# Patient Record
Sex: Female | Born: 1950 | Race: White | Hispanic: No | Marital: Married | Smoking: Current every day smoker
Health system: Southern US, Community
[De-identification: ages and names within clinical notes are randomized; demographics above are authoritative.]

## PROBLEM LIST (undated history)

## (undated) DIAGNOSIS — G43909 Migraine, unspecified, not intractable, without status migrainosus: Secondary | ICD-10-CM

## (undated) DIAGNOSIS — E785 Hyperlipidemia, unspecified: Secondary | ICD-10-CM

## (undated) DIAGNOSIS — I341 Nonrheumatic mitral (valve) prolapse: Secondary | ICD-10-CM

## (undated) DIAGNOSIS — E079 Disorder of thyroid, unspecified: Secondary | ICD-10-CM

## (undated) DIAGNOSIS — I10 Essential (primary) hypertension: Secondary | ICD-10-CM

## (undated) HISTORY — DX: Nonrheumatic mitral (valve) prolapse: I34.1

## (undated) HISTORY — DX: Migraine, unspecified, not intractable, without status migrainosus: G43.909

## (undated) HISTORY — DX: Hyperlipidemia, unspecified: E78.5

## (undated) HISTORY — DX: Disorder of thyroid, unspecified: E07.9

## (undated) HISTORY — PX: BUNIONECTOMY: SHX129

---

## 1997-08-17 ENCOUNTER — Emergency Department (HOSPITAL_COMMUNITY): Admission: EM | Admit: 1997-08-17 | Discharge: 1997-08-17 | Payer: Self-pay | Admitting: Emergency Medicine

## 1998-11-20 ENCOUNTER — Encounter: Payer: Self-pay | Admitting: Endocrinology

## 1998-11-20 ENCOUNTER — Ambulatory Visit (HOSPITAL_COMMUNITY): Admission: RE | Admit: 1998-11-20 | Discharge: 1998-11-20 | Payer: Self-pay | Admitting: Endocrinology

## 1998-11-24 ENCOUNTER — Encounter: Payer: Self-pay | Admitting: Endocrinology

## 1998-11-24 ENCOUNTER — Ambulatory Visit (HOSPITAL_COMMUNITY): Admission: RE | Admit: 1998-11-24 | Discharge: 1998-11-24 | Payer: Self-pay | Admitting: *Deleted

## 1998-11-25 ENCOUNTER — Ambulatory Visit (HOSPITAL_COMMUNITY): Admission: RE | Admit: 1998-11-25 | Discharge: 1998-11-25 | Payer: Self-pay | Admitting: Endocrinology

## 1998-11-25 ENCOUNTER — Encounter: Payer: Self-pay | Admitting: Endocrinology

## 1998-12-25 ENCOUNTER — Ambulatory Visit (HOSPITAL_COMMUNITY): Admission: RE | Admit: 1998-12-25 | Discharge: 1998-12-25 | Payer: Self-pay | Admitting: Cardiology

## 1999-01-20 ENCOUNTER — Other Ambulatory Visit: Admission: RE | Admit: 1999-01-20 | Discharge: 1999-01-20 | Payer: Self-pay | Admitting: *Deleted

## 2000-01-15 ENCOUNTER — Ambulatory Visit (HOSPITAL_BASED_OUTPATIENT_CLINIC_OR_DEPARTMENT_OTHER): Admission: RE | Admit: 2000-01-15 | Discharge: 2000-01-15 | Payer: Self-pay | Admitting: Orthopedic Surgery

## 2001-02-03 ENCOUNTER — Other Ambulatory Visit: Admission: RE | Admit: 2001-02-03 | Discharge: 2001-02-03 | Payer: Self-pay | Admitting: Family Medicine

## 2002-02-20 ENCOUNTER — Other Ambulatory Visit: Admission: RE | Admit: 2002-02-20 | Discharge: 2002-02-20 | Payer: Self-pay | Admitting: Family Medicine

## 2003-06-19 ENCOUNTER — Other Ambulatory Visit: Admission: RE | Admit: 2003-06-19 | Discharge: 2003-06-19 | Payer: Self-pay | Admitting: Family Medicine

## 2003-08-22 ENCOUNTER — Emergency Department (HOSPITAL_COMMUNITY): Admission: EM | Admit: 2003-08-22 | Discharge: 2003-08-23 | Payer: Self-pay | Admitting: Emergency Medicine

## 2004-08-19 ENCOUNTER — Ambulatory Visit: Payer: Self-pay | Admitting: Family Medicine

## 2004-08-19 ENCOUNTER — Other Ambulatory Visit: Admission: RE | Admit: 2004-08-19 | Discharge: 2004-08-19 | Payer: Self-pay | Admitting: Family Medicine

## 2004-09-02 ENCOUNTER — Encounter: Admission: RE | Admit: 2004-09-02 | Discharge: 2004-09-02 | Payer: Self-pay | Admitting: Family Medicine

## 2004-09-11 ENCOUNTER — Encounter: Admission: RE | Admit: 2004-09-11 | Discharge: 2004-09-11 | Payer: Self-pay | Admitting: Family Medicine

## 2005-02-19 ENCOUNTER — Ambulatory Visit: Payer: Self-pay | Admitting: Family Medicine

## 2006-04-06 ENCOUNTER — Ambulatory Visit: Payer: Self-pay | Admitting: Family Medicine

## 2006-04-06 LAB — CONVERTED CEMR LAB
AST: 22 units/L (ref 0–37)
Bilirubin, Direct: 0.1 mg/dL (ref 0.0–0.3)
Cholesterol: 387 mg/dL (ref 0–200)
Direct LDL: 95.2 mg/dL
Total Bilirubin: 0.7 mg/dL (ref 0.3–1.2)
Total Protein: 6.1 g/dL (ref 6.0–8.3)
VLDL: 140 mg/dL — ABNORMAL HIGH (ref 0–40)

## 2007-05-24 ENCOUNTER — Telehealth (INDEPENDENT_AMBULATORY_CARE_PROVIDER_SITE_OTHER): Payer: Self-pay | Admitting: *Deleted

## 2007-06-08 ENCOUNTER — Telehealth (INDEPENDENT_AMBULATORY_CARE_PROVIDER_SITE_OTHER): Payer: Self-pay | Admitting: *Deleted

## 2007-06-20 ENCOUNTER — Ambulatory Visit: Payer: Self-pay | Admitting: Internal Medicine

## 2007-06-20 DIAGNOSIS — F17200 Nicotine dependence, unspecified, uncomplicated: Secondary | ICD-10-CM | POA: Insufficient documentation

## 2007-06-20 DIAGNOSIS — E785 Hyperlipidemia, unspecified: Secondary | ICD-10-CM | POA: Insufficient documentation

## 2007-06-20 DIAGNOSIS — K219 Gastro-esophageal reflux disease without esophagitis: Secondary | ICD-10-CM

## 2007-06-20 DIAGNOSIS — F411 Generalized anxiety disorder: Secondary | ICD-10-CM

## 2007-06-20 DIAGNOSIS — E039 Hypothyroidism, unspecified: Secondary | ICD-10-CM | POA: Insufficient documentation

## 2007-06-22 ENCOUNTER — Telehealth: Payer: Self-pay | Admitting: Internal Medicine

## 2007-06-23 ENCOUNTER — Telehealth (INDEPENDENT_AMBULATORY_CARE_PROVIDER_SITE_OTHER): Payer: Self-pay | Admitting: *Deleted

## 2007-06-23 LAB — CONVERTED CEMR LAB
AST: 25 units/L (ref 0–37)
Albumin: 3.8 g/dL (ref 3.5–5.2)
Alkaline Phosphatase: 26 units/L — ABNORMAL LOW (ref 39–117)
BUN: 9 mg/dL (ref 6–23)
Bilirubin, Direct: 0.1 mg/dL (ref 0.0–0.3)
CO2: 30 meq/L (ref 19–32)
Calcium: 9.7 mg/dL (ref 8.4–10.5)
Chloride: 102 meq/L (ref 96–112)
Glucose, Bld: 93 mg/dL (ref 70–99)
HCT: 39.2 % (ref 36.0–46.0)
HDL: 32.7 mg/dL — ABNORMAL LOW (ref >39.0)
MCV: 89.8 fL (ref 78.0–100.0)
Platelets: 216 10*3/uL (ref 150–400)
RDW: 14.2 % (ref 11.5–14.6)
TSH: 2.62 microintl units/mL (ref 0.35–5.50)
Total Protein: 5.6 g/dL — ABNORMAL LOW (ref 6.0–8.3)
Triglycerides: 992 mg/dL (ref 0–149)
VLDL: 198 mg/dL — ABNORMAL HIGH (ref 0–40)

## 2007-06-27 ENCOUNTER — Ambulatory Visit: Payer: Self-pay | Admitting: Internal Medicine

## 2007-06-27 DIAGNOSIS — I10 Essential (primary) hypertension: Secondary | ICD-10-CM

## 2007-06-28 ENCOUNTER — Telehealth (INDEPENDENT_AMBULATORY_CARE_PROVIDER_SITE_OTHER): Payer: Self-pay | Admitting: *Deleted

## 2007-06-28 ENCOUNTER — Encounter: Payer: Self-pay | Admitting: Internal Medicine

## 2007-11-30 ENCOUNTER — Telehealth (INDEPENDENT_AMBULATORY_CARE_PROVIDER_SITE_OTHER): Payer: Self-pay | Admitting: *Deleted

## 2008-08-29 ENCOUNTER — Telehealth (INDEPENDENT_AMBULATORY_CARE_PROVIDER_SITE_OTHER): Payer: Self-pay | Admitting: *Deleted

## 2008-09-19 ENCOUNTER — Encounter (INDEPENDENT_AMBULATORY_CARE_PROVIDER_SITE_OTHER): Payer: Self-pay | Admitting: *Deleted

## 2008-10-28 ENCOUNTER — Telehealth (INDEPENDENT_AMBULATORY_CARE_PROVIDER_SITE_OTHER): Payer: Self-pay | Admitting: *Deleted

## 2008-11-12 ENCOUNTER — Encounter: Payer: Self-pay | Admitting: Family Medicine

## 2008-11-12 ENCOUNTER — Encounter: Payer: Self-pay | Admitting: Internal Medicine

## 2008-11-12 ENCOUNTER — Other Ambulatory Visit: Admission: RE | Admit: 2008-11-12 | Discharge: 2008-11-12 | Payer: Self-pay | Admitting: Family Medicine

## 2008-11-12 ENCOUNTER — Ambulatory Visit: Payer: Self-pay | Admitting: Family Medicine

## 2008-11-12 DIAGNOSIS — Z780 Asymptomatic menopausal state: Secondary | ICD-10-CM | POA: Insufficient documentation

## 2008-11-12 LAB — CONVERTED CEMR LAB: Nitrite: NEGATIVE

## 2008-11-18 ENCOUNTER — Telehealth: Payer: Self-pay | Admitting: Family Medicine

## 2008-11-26 ENCOUNTER — Encounter: Admission: RE | Admit: 2008-11-26 | Discharge: 2008-11-26 | Payer: Self-pay | Admitting: Family Medicine

## 2008-12-02 ENCOUNTER — Telehealth: Payer: Self-pay | Admitting: Family Medicine

## 2008-12-03 LAB — CONVERTED CEMR LAB
ALT: 26 units/L (ref 0–35)
AST: 21 units/L (ref 0–37)
Albumin: 4.2 g/dL (ref 3.5–5.2)
Alkaline Phosphatase: 49 units/L (ref 39–117)
Chloride: 103 meq/L (ref 96–112)
Cholesterol: 422 mg/dL — ABNORMAL HIGH (ref 0–200)
Creatinine, Ser: 0.7 mg/dL (ref 0.4–1.2)
Eosinophils Relative: 4 % (ref 0.0–5.0)
Folate: >20 ng/mL
Free T4: 1 ng/dL (ref 0.6–1.6)
GFR calc non Af Amer: 90.64 mL/min (ref >60)
Glucose, Bld: 92 mg/dL (ref 70–99)
HDL: 39.3 mg/dL (ref >39.00)
Hemoglobin: 12.8 g/dL (ref 12.0–15.0)
MCHC: 32.6 g/dL (ref 30.0–36.0)
MCV: 90.4 fL (ref 78.0–100.0)
Neutrophils Relative %: 36 % — ABNORMAL LOW (ref 43.0–77.0)
Platelets: 242 10*3/uL (ref 150.0–400.0)
Potassium: 4.2 meq/L (ref 3.5–5.1)
RDW: 13.5 % (ref 11.5–14.6)
VLDL: 117.6 mg/dL — ABNORMAL HIGH (ref 0.0–40.0)

## 2009-12-01 ENCOUNTER — Telehealth: Payer: Self-pay | Admitting: Family Medicine

## 2010-02-18 ENCOUNTER — Telehealth (INDEPENDENT_AMBULATORY_CARE_PROVIDER_SITE_OTHER): Payer: Self-pay | Admitting: *Deleted

## 2010-02-23 ENCOUNTER — Other Ambulatory Visit: Payer: Self-pay | Admitting: Family Medicine

## 2010-02-23 ENCOUNTER — Encounter (INDEPENDENT_AMBULATORY_CARE_PROVIDER_SITE_OTHER): Payer: Self-pay | Admitting: *Deleted

## 2010-02-23 ENCOUNTER — Encounter: Payer: Self-pay | Admitting: Family Medicine

## 2010-02-23 ENCOUNTER — Other Ambulatory Visit
Admission: RE | Admit: 2010-02-23 | Discharge: 2010-02-23 | Payer: Self-pay | Source: Home / Self Care | Admitting: Family Medicine

## 2010-02-23 ENCOUNTER — Ambulatory Visit
Admission: RE | Admit: 2010-02-23 | Discharge: 2010-02-23 | Payer: Self-pay | Source: Home / Self Care | Attending: Family Medicine | Admitting: Family Medicine

## 2010-02-23 DIAGNOSIS — M2550 Pain in unspecified joint: Secondary | ICD-10-CM | POA: Insufficient documentation

## 2010-02-23 DIAGNOSIS — D239 Other benign neoplasm of skin, unspecified: Secondary | ICD-10-CM | POA: Insufficient documentation

## 2010-02-23 LAB — CBC WITH DIFFERENTIAL/PLATELET
Basophils Absolute: 0 10*3/uL (ref 0.0–0.1)
Basophils Relative: 0.1 % (ref 0.0–3.0)
Eosinophils Absolute: 0.1 10*3/uL (ref 0.0–0.7)
Eosinophils Relative: 1.4 % (ref 0.0–5.0)
HCT: 39.5 % (ref 36.0–46.0)
Hemoglobin: 13.1 g/dL (ref 12.0–15.0)
Lymphocytes Relative: 25.5 % (ref 12.0–46.0)
Lymphs Abs: 1.7 10*3/uL (ref 0.7–4.0)
MCHC: 33.3 g/dL (ref 30.0–36.0)
MCV: 89.7 fl (ref 78.0–100.0)
Monocytes Absolute: 0.3 10*3/uL (ref 0.1–1.0)
Monocytes Relative: 4.2 % (ref 3.0–12.0)
Neutro Abs: 4.5 10*3/uL (ref 1.4–7.7)
Neutrophils Relative %: 68.8 % (ref 43.0–77.0)
Platelets: 211 10*3/uL (ref 150.0–400.0)
RBC: 4.41 Mil/uL (ref 3.87–5.11)
RDW: 15.5 % — ABNORMAL HIGH (ref 11.5–14.6)
WBC: 6.6 10*3/uL (ref 4.5–10.5)

## 2010-02-23 LAB — LIPID PANEL
Cholesterol: 446 mg/dL — ABNORMAL HIGH (ref 0–200)
HDL: 50.9 mg/dL (ref >39.00)
Total CHOL/HDL Ratio: 9
Triglycerides: 567 mg/dL — ABNORMAL HIGH (ref 0.0–149.0)
VLDL: 113.4 mg/dL — ABNORMAL HIGH (ref 0.0–40.0)

## 2010-02-23 LAB — BASIC METABOLIC PANEL
BUN: 11 mg/dL (ref 6–23)
CO2: 25 mEq/L (ref 19–32)
Calcium: 9.4 mg/dL (ref 8.4–10.5)
Chloride: 101 mEq/L (ref 96–112)
Creatinine, Ser: 0.7 mg/dL (ref 0.4–1.2)
GFR: 85.99 mL/min (ref >60.00)
Glucose, Bld: 105 mg/dL — ABNORMAL HIGH (ref 70–99)
Potassium: 4.1 mEq/L (ref 3.5–5.1)
Sodium: 136 mEq/L (ref 135–145)

## 2010-02-23 LAB — HEPATIC FUNCTION PANEL
ALT: 25 U/L (ref 0–35)
AST: 20 U/L (ref 0–37)
Albumin: 4.2 g/dL (ref 3.5–5.2)
Alkaline Phosphatase: 48 U/L (ref 39–117)
Bilirubin, Direct: 0 mg/dL (ref 0.0–0.3)
Total Bilirubin: 0.5 mg/dL (ref 0.3–1.2)
Total Protein: 6.9 g/dL (ref 6.0–8.3)

## 2010-02-23 LAB — TSH: TSH: 1.55 u[IU]/mL (ref 0.35–5.50)

## 2010-02-23 LAB — T4, FREE: Free T4: 0.96 ng/dL (ref 0.60–1.60)

## 2010-02-23 LAB — LDL CHOLESTEROL, DIRECT: Direct LDL: 152.5 mg/dL

## 2010-02-23 LAB — SEDIMENTATION RATE: Sed Rate: 27 mm/hr — ABNORMAL HIGH (ref 0–22)

## 2010-02-23 LAB — T3, FREE: T3, Free: 2.3 pg/mL (ref 2.3–4.2)

## 2010-02-25 LAB — CONVERTED CEMR LAB
Anti Nuclear Antibody(ANA): NEGATIVE
Rhuematoid fact SerPl-aCnc: <10 intl units/mL (ref <=14)

## 2010-02-26 ENCOUNTER — Encounter: Payer: Self-pay | Admitting: Family Medicine

## 2010-03-01 ENCOUNTER — Encounter: Payer: Self-pay | Admitting: Family Medicine

## 2010-03-05 ENCOUNTER — Encounter
Admission: RE | Admit: 2010-03-05 | Discharge: 2010-03-05 | Payer: Self-pay | Source: Home / Self Care | Attending: Family Medicine | Admitting: Family Medicine

## 2010-03-05 ENCOUNTER — Encounter: Payer: Self-pay | Admitting: Family Medicine

## 2010-03-10 NOTE — Progress Notes (Signed)
Summary: CPX needed  Phone Note Outgoing Call   Call placed by: Army Fossa CMA,  December 01, 2009 4:19 PM Summary of Call: Pt needs CPX before additional refills.   Follow-up for Phone Call        Patient refused appt stating that this is an extremely busy time for her. Patient will call for CPX before she runs out of meds. She is aware no refills until after CPX appt. Lucious Groves CMA  December 02, 2009 3:11 PM

## 2010-03-12 ENCOUNTER — Ambulatory Visit: Admit: 2010-03-12 | Payer: Self-pay | Admitting: Gastroenterology

## 2010-03-12 NOTE — Letter (Signed)
Summary: Pre Visit Letter Revised  [facility]  [address] [place], [place] [address]   Phone: [phone]  Fax: [phone]        02/23/2010 MRN: [id number] Samantha Hanson [address] [place], [place]  [address]                            Procedure Date:  03/26/2010 @ 8:00AM                 Direct colon-Dr. Arlyce Dice Welcome to the Gastroenterology Division at [facility].    You are scheduled to see a nurse for your pre-procedure visit on 03/12/2010 at 2:00 on the 3rd floor at [facility], [address]  We ask that you try to arrive at our office 15 minutes prior to your appointment time to allow for check-in.  Please take a minute to review the attached form.  If you answer "Yes" to one or more of the questions on the first page, we ask that you call the person listed at your earliest opportunity.  If you answer "No" to all of the questions, please complete the rest of the form and bring it to your appointment.    Your nurse visit will consist of discussing your medical and surgical history, your immediate family medical history, and your medications.   If you are unable to list all of your medications on the form, please bring the medication bottles to your appointment and we will list them.  We will need to be aware of both prescribed and over the counter drugs.  We will need to know exact dosage information as well.    Please be prepared to read and sign documents such as consent forms, a financial agreement, and acknowledgement forms.  If necessary, and with your consent, a friend or relative is welcome to sit-in on the nurse visit with you.  Please bring your insurance card so that we may make a copy of it.  If your insurance requires a referral to see a specialist, please bring your referral form from your primary care physician.  No co-pay is required for this nurse visit.     If you cannot keep your appointment, please call [phone] to cancel  or reschedule prior to your appointment date.  This allows [place] the opportunity to schedule an appointment for another patient in need of care.    Thank you for choosing [facility] for your medical needs.  We appreciate the opportunity to care for you.  Please visit [place] at our website  to learn more about our practice.  Sincerely, The Gastroenterology Division

## 2010-03-12 NOTE — Letter (Signed)
Summary: Results Follow up Letter  [facility]  [address] [place], [place] [address]   Phone: [phone]  Fax: [phone]    02/26/2010 MRN: [id number]      Samantha Hanson [address] [place], [place]  [address]     Dear Ms. Kempner,   The following are the results of your recent test(s):    Test         Result    Pap Smear:        Normal __X___  Not Normal _____ Comments:       We routinely do not discuss normal results over the telephone.  If you desire a copy of the results, or you have any questions about this information we can discuss them at your next office visit.   Sincerely,  Almeta Monas CMA

## 2010-03-12 NOTE — Assessment & Plan Note (Signed)
Summary: cpx---will be fasting///sph   Vital Signs:  Patient profile:   60 year old female Height:      66.5 inches Weight:      175 pounds BMI:     27.92 O2 Sat:      95 % on Room air Temp:     98.5 degrees F oral Pulse rate:   100 / minute Resp:     18 per minute BP sitting:   120 / 86  (left arm)  Vitals Entered By: Jeremy Johann CMA (February 23, 2010 1:11 PM)  O2 Flow:  Room air CC: CPX, fasting, pap   History of Present Illness: Pt here for cpe. pap and fasting labs. Pt still c/o joint pains.   Pt is also coughing a lot for about 1 month--- coughing up white / clear mucous.  Pt took otc herbal meds with no relief.  Pt took benadryl and mult cough meds with no relief.    Hyperlipidemia follow-up      This is a 60 year old woman who presents for Hyperlipidemia follow-up.  The patient denies muscle aches, GI upset, abdominal pain, flushing, itching, constipation, diarrhea, and fatigue.  The patient denies the following symptoms: chest pain/pressure, exercise intolerance, dypsnea, palpitations, syncope, and pedal edema.  Compliance with medications (by patient report) has been near 100%.  Dietary compliance has been good.  The patient reports no exercise.  Adjunctive measures currently used by the patient include fish oil supplements.    Hypertension follow-up      The patient also presents for Hypertension follow-up.  The patient denies lightheadedness, urinary frequency, headaches, edema, impotence, rash, and fatigue.  The patient denies the following associated symptoms: chest pain, chest pressure, exercise intolerance, dyspnea, palpitations, syncope, leg edema, and pedal edema.  Compliance with medications (by patient report) has been near 100%.  The patient reports that dietary compliance has been good.  The patient reports no exercise.  Adjunctive measures currently used by the patient include salt restriction.    Preventive Screening-Counseling &  Management  Alcohol-Tobacco     Alcohol drinks/day: 0     Smoking Status: current     Smoking Cessation Counseling: yes     Smoke Cessation Stage: contemplative     Packs/Day: 1     Year Started: 1968  Caffeine-Diet-Exercise     Caffeine use/day: 0     Does Patient Exercise: no  Hep-HIV-STD-Contraception     Dental Visit-last 6 months no     Dental Care Counseling: to seek dental care; no dental care within six months     SBE monthly: yes      Sexual History:  currently monogamous.    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 1 By Mouth Once Daily 2)  Effexor Xr 75 Mg Xr24h-Cap (Venlafaxine Hcl) .[name].. 1 By Mouth Once Daily 3)  Cyclobenzaprine Hcl 10 Mg  Tabs (Cyclobenzaprine Hcl) .[name].. 1 By Mouth Prn 4)  Prevacid 30 Mg  Cpdr (Lansoprazole) .[name].. 1 By Mouth Qd 5)  Proventil Hfa 108 (90 Base) Mcg/act  Aers (Albuterol Sulfate) .[name].. 1-2 Puffs Q 4 Hours As Needed Sob 6)  Metoprolol Tartrate 25 Mg  Tabs (Metoprolol Tartrate) .[name].. 1 Bid 7)  Gemfibrozil 600 Mg  Tabs (Gemfibrozil) .[name].. 1 Bid 8)  Imitrex 50 Mg Tabs (Sumatriptan Succinate) .... As Directed 9)  Vicodin 5-500 Mg Tabs (Hydrocodone-Acetaminophen) .[name].. 1 By Mouth Q6h As Needed Headache 10)  Diflucan 150 Mg Tabs (Fluconazole) .[name].. 1 By Mouth Once Daily- Repeat in One Week As Needed. 11)  Vitamin D (Ergocalciferol) 50000 Unit Caps (Ergocalciferol) .[name].. 1 By Mouth Once Weekly. 12)  Lipitor 20 Mg Tabs (Atorvastatin Calcium) .[name].. 1 By  Mouth At Bedtime. 13)  Vitamin D3 2000 Unit Caps (Cholecalciferol) .[name].. 1 By Mouth Daily. 14)  Fish Oil  Oil (Fish Oil) .[name].. 1 By Mouth Two Times A Day  Current Medications (verified): 1)  Synthroid 112 Mcg  Tabs (Levothyroxine Sodium) .[name].. 1 By Mouth Once Daily 2)  Effexor Xr 75 Mg Xr24h-Cap (Venlafaxine Hcl) .[name].. 1 By Mouth Once Daily 3)  Cyclobenzaprine Hcl 10 Mg  Tabs (Cyclobenzaprine Hcl) .[name].. 1 By Mouth Prn 4)  Prevacid 30 Mg  Cpdr (Lansoprazole) .[name].. 1 By Mouth Qd 5)  Proventil Hfa 108 (90 Base) Mcg/act  Aers (Albuterol Sulfate) .[name].. 1-2 Puffs Q 4 Hours As Needed Sob 6)  Metoprolol Tartrate 25 Mg  Tabs (Metoprolol Tartrate) .[name].. 1 Bid 7)  Imitrex 50 Mg Tabs (Sumatriptan Succinate) .... As Directed 8)  Vicodin 5-500 Mg Tabs (Hydrocodone-Acetaminophen) .[name].. 1 By Mouth Q6h As Needed Headache 9)  Lipitor 20 Mg Tabs (Atorvastatin Calcium) .[name].. 1 By Mouth At Bedtime. 10)  Vitamin D3 2000 Unit Caps (Cholecalciferol) .[name].. 1 By Mouth Daily. 11)  Fish Oil  Oil (Fish Oil) .[name].. 1 By Mouth Two Times A Day 12)  Glucosamine 2000 .... Take 1 Tab Once Daily 13)  Daily-Vitamin  Tabs (Multiple Vitamin) .... Take 1 Tab Once Daily 14)  Minocycline Hcl 100 Mg Tabs (Minocycline Hcl) .... Take 1 Tab On Breakout 15)  Calcium .... Take 1 Tab Once Daily 16)  Zithromax Z-Pak 250 Mg Tabs (Azithromycin) .... As Directed 17)  Minocycline Hcl 100 Mg Caps (Minocycline Hcl) .[name].. 1 By Mouth Two Times A Day  Allergies (verified): No Known Drug Allergies  Past History:  Past Medical History: Last updated: 06/20/2007 Hyperlipidemia Hypothyroidism mitral valve prolapse migraine  Past Surgical History: Last updated: 06/27/2007 bunion right foot ; cath : "fine", Dr Aleen Campi  Family History: Last updated: 06/20/2007 Father:CAD  Mother:thyroid CA  Siblings:neg   Social History: Last updated: 11/12/2008 Current Smoker Married Alcohol use-no Drug use-no Regular exercise-yes  Risk Factors: Alcohol  Use: 0 (02/23/2010) Caffeine Use: 0 (02/23/2010) Exercise: no (02/23/2010)  Risk Factors: Smoking Status: current (02/23/2010) Packs/Day: 1 (02/23/2010)  Family History: Reviewed history from 06/20/2007 and no changes required. Father:CAD  Mother:thyroid CA  Siblings:neg   Social History: Reviewed history from 11/12/2008 and no changes required. Current Smoker Married Alcohol use-no Drug use-no Regular exercise-yes Does Patient Exercise:  no Sexual History:  currently monogamous  Review of Systems      See HPI General:  Denies chills, fatigue, fever, loss of appetite, malaise, sleep disorder, sweats, weakness, and weight loss. Eyes:  Denies blurring, discharge, double vision, eye irritation, eye pain, halos, itching, light sensitivity, red eye, vision loss-1 eye, and vision loss-both eyes. ENT:  Denies decreased hearing, difficulty swallowing, ear discharge, earache, hoarseness, nasal congestion, nosebleeds, postnasal drainage, ringing in ears, sinus pressure,  and sore throat. CV:  Denies bluish discoloration of lips or nails, chest pain or discomfort, difficulty breathing at night, difficulty breathing while lying down, fainting, fatigue, leg cramps with exertion, lightheadness, near fainting, palpitations, shortness of breath with exertion, swelling of feet, swelling of hands, and weight gain. Resp:  Denies chest discomfort, chest pain with inspiration, cough, coughing up blood, excessive snoring, hypersomnolence, morning headaches, pleuritic, shortness of breath, sputum productive, and wheezing. GI:  Denies abdominal pain, bloody stools, change in bowel habits, constipation, dark tarry stools, diarrhea, excessive appetite, gas, hemorrhoids, indigestion, loss of appetite, nausea, vomiting, vomiting blood, and yellowish skin color. GU:  Denies abnormal vaginal bleeding, decreased libido, discharge, dysuria, genital sores, hematuria, incontinence, nocturia, urinary frequency, and  urinary hesitancy. MS:  Complains of joint pain; denies joint redness, joint swelling, loss of strength, low back pain, mid back pain, muscle aches, muscle , cramps, muscle weakness, stiffness, and thoracic pain. Derm:  Denies changes in color of skin, changes in nail beds, dryness, excessive perspiration, flushing, hair loss, insect bite(s), itching, lesion(s), poor wound healing, and rash. Neuro:  Denies brief paralysis, difficulty with concentration, disturbances in coordination, falling down, headaches, inability to speak, memory loss, numbness, poor balance, seizures, sensation of room spinning, tingling, tremors, visual disturbances, and weakness. Psych:  Denies alternate hallucination ( auditory/visual), anxiety, depression, easily angered, easily tearful, irritability, mental problems, panic attacks, sense of great danger, suicidal thoughts/plans, thoughts of violence, unusual visions or sounds, and thoughts /plans of harming others. Endo:  Denies cold intolerance, excessive hunger, excessive thirst, excessive urination, heat intolerance, polyuria, and weight change. Heme:  Denies abnormal bruising, bleeding, enlarge lymph nodes, fevers, pallor, and skin discoloration. Allergy:  Denies hives or rash, itching eyes, persistent infections, seasonal allergies, and sneezing.  Physical Exam  General:  Well-developed,well-nourished,in no acute distress; alert,appropriate and cooperative throughout examination Head:  Normocephalic and atraumatic without obvious abnormalities. No apparent alopecia or balding. Eyes:  pupils equal, pupils round, pupils reactive to light, and no injection.   Ears:  External ear exam shows no significant lesions or deformities.  Otoscopic examination reveals clear canals, tympanic membranes are intact bilaterally without bulging, retraction, inflammation or discharge. Hearing is grossly normal bilaterally. Nose:  External nasal examination shows no deformity or  inflammation. Nasal mucosa are pink and moist without lesions or exudates. Mouth:  Oral mucosa and oropharynx without lesions or exudates.  Teeth in good repair. Neck:  No deformities, masses, or tenderness noted. Chest Wall:  No deformities, masses, or tenderness noted. Lungs:  Normal respiratory effort, chest expands symmetrically. Lungs are clear to auscultation, no crackles or wheezes. Heart:  normal rate and no murmur.   Abdomen:  Bowel sounds positive,abdomen soft and non-tender without masses, organomegaly or hernias noted. Rectal:  No external abnormalities noted. Normal sphincter tone. No rectal masses or tenderness. Genitalia:  Pelvic Exam:        External: normal female genitalia without lesions or masses        Vagina: normal without lesions or masses        Cervix: normal without lesions or masses        Adnexa: normal bimanual exam without masses or fullness        Uterus: normal by palpation        Pap smear: performed Msk:  No deformity or scoliosis noted of thoracic or lumbar spine.   Pulses:  R and L carotid,radial,femoral,dorsalis pedis and posterior tibial pulses are full and equal bilaterally Extremities:  No clubbing, cyanosis,  edema, or deformity noted with normal full range of motion of all joints.   Neurologic:  No cranial nerve deficits noted. Station and gait are normal. Plantar reflexes are down-going bilaterally. DTRs are symmetrical throughout. Sensory, motor and coordinative functions appear intact. Skin:  Intact without suspicious lesions or rashes Cervical Nodes:  No lymphadenopathy noted Axillary Nodes:  No palpable lymphadenopathy Psych:  Cognition and judgment appear intact. Alert and cooperative with normal attention span and concentration. No apparent delusions, illusions, hallucinations   Impression & Recommendations:  Problem # 1:  PREVENTIVE HEALTH CARE (ICD-V70.0)  Orders: Venipuncture (71062) TLB-Lipid Panel (80061-LIPID) TLB-BMP (Basic  Metabolic Panel-BMET) (80048-METABOL) TLB-CBC Platelet - w/Differential (85025-CBCD) TLB-Hepatic/Liver Function Pnl (80076-HEPATIC) TLB-TSH (Thyroid Stimulating Hormone) (84443-TSH) TLB-Sedimentation Rate (ESR) (85652-ESR) T-Rheumatoid Factor (69485-46270) T-Antinuclear Antib (ANA) [phone]) TLB-T4 (Thyrox), Free [phone]) TLB-T3, Free (Triiodothyronine) (84481-T3FREE) Radiology Referral (Radiology) T- * Misc. Laboratory test [phone]) Specimen Handling (01751) Gastroenterology Referral (GI) EKG w/ Interpretation (93000)  Problem # 2:  HYPERTRIGLYCERIDEMIA, SEVERE (ICD-272.4)  The following medications were removed from the medication list:    Gemfibrozil 600 Mg Tabs (Gemfibrozil) .[name]... 1 bid Her updated medication list for this problem includes:    Lipitor 20 Mg Tabs (Atorvastatin calcium) .[name]... 1 by mouth at bedtime.  Orders: Venipuncture (02585) TLB-Lipid Panel (80061-LIPID) TLB-BMP (Basic Metabolic Panel-BMET) (80048-METABOL) TLB-CBC Platelet - w/Differential (85025-CBCD) TLB-Hepatic/Liver Function Pnl (80076-HEPATIC) TLB-TSH (Thyroid Stimulating Hormone) (84443-TSH) TLB-Sedimentation Rate (ESR) (85652-ESR) T-Rheumatoid Factor (27782-42353) T-Antinuclear Antib (ANA) [phone]) TLB-T4 (Thyrox), Free [phone]) TLB-T3, Free (Triiodothyronine) (84481-T3FREE) Specimen Handling (32671) EKG w/ Interpretation (93000)  Labs Reviewed: SGOT: 21 (11/12/2008)   SGPT: 26 (11/12/2008)  Lipid Goals: Chol Goal: 200 (06/27/2007)   HDL Goal: 40 (06/27/2007)   LDL Goal: 100 (06/27/2007)   TG Goal: 150 (06/27/2007)  Prior 10 Yr Risk Heart Disease: Not enough information (06/27/2007)   HDL:39.30 (11/12/2008), 32.7 (06/20/2007)  LDL:DEL (06/20/2007), DEL (04/06/2006)  Chol:422 (11/12/2008), 569 (06/20/2007)  Trig:588.0 (11/12/2008), 992 (06/20/2007)  Problem # 3:  HYPERTENSION, ESSENTIAL NOS (ICD-401.9)  Her updated medication list for this problem includes:    Metoprolol  Tartrate 25 Mg Tabs (Metoprolol tartrate) .[name]... 1 bid  Orders: Venipuncture (24580) TLB-Lipid Panel (80061-LIPID) TLB-BMP (Basic Metabolic Panel-BMET) (80048-METABOL) TLB-CBC Platelet - w/Differential (85025-CBCD) TLB-Hepatic/Liver Function Pnl (80076-HEPATIC) TLB-TSH (Thyroid Stimulating Hormone) (84443-TSH) TLB-Sedimentation Rate (ESR) (85652-ESR) T-Rheumatoid Factor (99833-82505) T-Antinuclear Antib (ANA) [phone]) TLB-T4 (Thyrox), Free [phone]) TLB-T3, Free (Triiodothyronine) (84481-T3FREE) Specimen Handling (53299) EKG w/ Interpretation (93000)  BP today: 120/86 Prior BP: 142/90 (11/12/2008)  Prior 10 Yr Risk Heart Disease: Not enough information (06/27/2007)  Labs Reviewed: K+: 4.2 (11/12/2008) Creat: : 0.7 (11/12/2008)   Chol: 422 (11/12/2008)   HDL: 39.30 (11/12/2008)   LDL: DEL (06/20/2007)   TG: 588.0 (11/12/2008)  Problem # 4:  PAIN IN JOINT, MULTIPLE SITES (ICD-719.49)  Orders: Venipuncture (24268) TLB-Lipid Panel (80061-LIPID) TLB-BMP (Basic Metabolic Panel-BMET) (80048-METABOL) TLB-CBC Platelet - w/Differential (85025-CBCD) TLB-Hepatic/Liver Function Pnl (80076-HEPATIC) TLB-TSH (Thyroid Stimulating Hormone) (84443-TSH) TLB-Sedimentation Rate (ESR) (85652-ESR) T-Rheumatoid Factor (34196-22297) T-Antinuclear Antib (ANA) [phone]) TLB-T4 (Thyrox), Free [phone]) TLB-T3, Free (Triiodothyronine) (84481-T3FREE) Specimen Handling (56314) EKG w/ Interpretation (93000)  Problem # 5:  NEVI, MULTIPLE (ICD-216.9)  Orders: Dermatology Referral (Derma) EKG w/ Interpretation (93000)  Problem # 6:  POSTMENOPAUSAL STATUS (ICD-V49.81)  Orders: Venipuncture (97026) TLB-Lipid Panel (80061-LIPID) TLB-BMP (Basic Metabolic Panel-BMET) (80048-METABOL) TLB-CBC Platelet - w/Differential (85025-CBCD) TLB-Hepatic/Liver Function Pnl (80076-HEPATIC) TLB-TSH (Thyroid Stimulating Hormone) (84443-TSH) TLB-Sedimentation Rate (ESR) (85652-ESR) T-Rheumatoid Factor  (37858-85027) T-Antinuclear Antib (ANA) [phone]) TLB-T4 (Thyrox), Free [phone]) TLB-T3, Free (  Triiodothyronine) (84481-T3FREE) Radiology Referral (Radiology) Specimen Handling (04540) EKG w/ Interpretation (93000)  Problem # 7:  HYPOTHYROIDISM (ICD-244.9)  Her updated medication list for this problem includes:    Synthroid 112 Mcg Tabs (Levothyroxine sodium) .[name]... 1 by mouth once daily  Orders: Venipuncture (98119) TLB-Lipid Panel (80061-LIPID) TLB-BMP (Basic Metabolic Panel-BMET) (80048-METABOL) TLB-CBC Platelet - w/Differential (85025-CBCD) TLB-Hepatic/Liver Function Pnl (80076-HEPATIC) TLB-TSH (Thyroid Stimulating Hormone) (84443-TSH) TLB-Sedimentation Rate (ESR) (85652-ESR) T-Rheumatoid Factor (14782-95621) T-Antinuclear Antib (ANA) [phone]) TLB-T4 (Thyrox), Free [phone]) TLB-T3, Free (Triiodothyronine) (84481-T3FREE) Specimen Handling (24401) EKG w/ Interpretation (93000)  Labs Reviewed: TSH: 0.92 (11/12/2008)    Chol: 422 (11/12/2008)   HDL: 39.30 (11/12/2008)   LDL: DEL (06/20/2007)   TG: 588.0 (11/12/2008)  Problem # 8:  CIGARETTE SMOKER (ICD-305.1)  Encouraged smoking cessation and discussed different methods for smoking cessation.   Complete Medication List: 1)  Synthroid 112 Mcg Tabs (Levothyroxine sodium) .[name].. 1 by mouth once daily 2)  Effexor Xr 75 Mg Xr24h-cap (Venlafaxine hcl) .[name].. 1 by mouth once daily 3)  Cyclobenzaprine Hcl 10 Mg Tabs (Cyclobenzaprine hcl) .[name].. 1 by mouth prn 4)  Prevacid 30 Mg Cpdr (Lansoprazole) .[name].. 1 by mouth qd 5)  Proventil Hfa 108 (90 Base) Mcg/act Aers (Albuterol sulfate) .[name].. 1-2 puffs q 4 hours as needed sob 6)  Metoprolol Tartrate 25 Mg Tabs (Metoprolol tartrate) .[name].. 1 bid 7)  Imitrex 50 Mg Tabs (Sumatriptan succinate) .... As directed 8)  Vicodin 5-500 Mg Tabs (Hydrocodone-acetaminophen) .[name].. 1 by mouth q6h as needed headache 9)  Lipitor 20 Mg Tabs (Atorvastatin calcium) .[name].. 1 by mouth at bedtime. 10)   Vitamin D3 2000 Unit Caps (Cholecalciferol) .[name].. 1 by mouth daily. 11)  Fish Oil Oil (Fish oil) .[name].. 1 by mouth two times a day 12)  Glucosamine 2000  .... Take 1 tab once daily 13)  Daily-vitamin Tabs (Multiple vitamin) .... Take 1 tab once daily 14)  Minocycline Hcl 100 Mg Tabs (Minocycline hcl) .... Take 1 tab on breakout 15)  Calcium  .... Take 1 tab once daily 16)  Zithromax Z-pak 250 Mg Tabs (Azithromycin) .... As directed 17)  Minocycline Hcl 100 Mg Caps (Minocycline hcl) .[name].. 1 by mouth two times a day Prescriptions: MINOCYCLINE HCL 100 MG CAPS (MINOCYCLINE HCL) 1 by mouth two times a day  #60 x 2   Entered and Authorized by:   Loreen Freud DO   Signed by:   Floydene Flock on 02/23/2010   Method used:   Electronically to        [facility] [address] [id number]* (retail)       [address]       [place], [place]  [address]       Ph: [phone] or [phone]       Fax: [phone]   RxID:   [id number] VICODIN 5-500 MG TABS (HYDROCODONE-ACETAMINOPHEN) 1 by mouth q6h as needed headache  #30 x 0   Entered and Authorized by:   Loreen Freud DO   Signed by:   Loreen Freud DO on 02/23/2010   Method used:   Print then Give to Patient   RxID:   [id number] ZITHROMAX Z-PAK 250 MG TABS (AZITHROMYCIN) as directed  #1 x 0   Entered and Authorized by:   Loreen Freud DO   Signed by:   Loreen Freud DO on 02/23/2010   Method used:   Electronically to        [facility]  [facility] [id number] (retail)       [address]       [place], [place]  [address]       Ph: [phone] or [phone]       Fax: [phone]   RxID:   [id number] SYNTHROID 112 MCG  TABS (LEVOTHYROXINE SODIUM) 1 by mouth once daily Brand medically necessary #90 x 3   Entered and Authorized by:   Loreen Freud DO   Signed by:   Loreen Freud DO on 02/23/2010   Method used:   Printed then faxed to ...       [facility]  [id number]* (retail)       [address]       [place], [place]  [address]       Ph: [phone] or [phone]       Fax: [phone]   RxID:   [id number]    Orders Added: 1)  Venipuncture [26948] 2)  TLB-Lipid Panel [80061-LIPID] 3)  TLB-BMP (Basic Metabolic Panel-BMET) [80048-METABOL] 4)  TLB-CBC Platelet - w/Differential [85025-CBCD] 5)  TLB-Hepatic/Liver Function Pnl [80076-HEPATIC] 6)  TLB-TSH (Thyroid Stimulating Hormone) [84443-TSH] 7)  TLB-Sedimentation Rate (ESR) [85652-ESR] 8)  T-Rheumatoid Factor [54627-03500] 9)  T-Antinuclear Antib (ANA) [93818-29937] 10)  TLB-T4 (Thyrox), Free [16967-EL3Y] 11)  TLB-T3, Free (Triiodothyronine) [10175-Z0CHEN] 12)  Radiology Referral [Radiology] 13)  Radiology Referral [Radiology] 14)  T- * Misc. Laboratory test [phone] 15)  Dermatology Referral [Derma] 16)  Specimen Handling [99000] 17)  Gastroenterology Referral [GI] 18)  Est. Patient 40-64 years [99396] 54)  EKG w/ Interpretation [93000]     Flu Vaccine Next Due:  Refused

## 2010-03-12 NOTE — Progress Notes (Signed)
Summary: Synthroid, Venlafaxine refills  Phone Note Refill Request Message from:  Patient on February 18, 2010 4:45 PM  Refills Requested: Medication #1:  SYNTHROID 112 MCG  TABS 1 by mouth once daily  Medication #2:  EFFEXOR XR 75 MG XR24H-CAP 1 by mouth once daily [facility] 90 day prescriptiion ---will see Dr Laury Axon for CPX on 1/16 at 1:00  Next Appointment Scheduled: Mon   1/16    [place] Initial call taken by: Jerolyn Shin,  February 18, 2010 4:47 PM    Prescriptions: EFFEXOR XR 75 MG XR24H-CAP (VENLAFAXINE HCL) 1 by mouth once daily  #90 Capsule x 0   Entered by:   Almeta Monas CMA (AAMA)   Authorized by:   Loreen Freud DO   Signed by:   Almeta Monas CMA (AAMA) on 02/19/2010   Method used:   Faxed to ...       [facility] (mail-order)             , [place]         Ph: [phone]       Fax: [phone]   RxID:   [id number] SYNTHROID 112 MCG  TABS (LEVOTHYROXINE SODIUM) 1 by mouth once daily  #90 x 0   Entered by:   Almeta Monas CMA (AAMA)   Authorized by:   Loreen Freud DO   Signed by:   Almeta Monas CMA (AAMA) on 02/19/2010   Method used:   Faxed to ...       [facility] (mail-order)             , [place]         Ph: [phone]       Fax: [phone]   RxID:   [id number]

## 2010-03-26 ENCOUNTER — Other Ambulatory Visit: Payer: Self-pay | Admitting: Gastroenterology

## 2010-05-18 ENCOUNTER — Other Ambulatory Visit: Payer: Self-pay | Admitting: Family Medicine

## 2010-05-18 DIAGNOSIS — F329 Major depressive disorder, single episode, unspecified: Secondary | ICD-10-CM

## 2010-05-18 NOTE — Telephone Encounter (Signed)
Patient needs 90 day prescription for Venlafaxine HCL ER cap mailed to her (I verified address)---  She has [employer] and they have changed from [facility] to [facility]---patient needs to send this prescription in with her new patient packet

## 2010-05-19 ENCOUNTER — Telehealth: Payer: Self-pay | Admitting: Family Medicine

## 2010-05-19 MED ORDER — VENLAFAXINE HCL ER 75 MG PO CP24: 75.0000 mg | ORAL_CAPSULE | Freq: Every day | ORAL | Status: DC

## 2010-05-19 NOTE — Telephone Encounter (Signed)
Rx sent in the mail today to the patients home address as requested     KP

## 2010-05-19 NOTE — Telephone Encounter (Signed)
Refill for year

## 2010-05-19 NOTE — Telephone Encounter (Signed)
Patient needs two more 90 day prescriptions mailed to her since she changed from [facility] to [facility]    1)  Synthroid  112   And    2) Pravacid 30     Please mail to her

## 2010-05-19 NOTE — Telephone Encounter (Signed)
Informed patient about this prescription when she called two more in to be mailed

## 2010-05-19 NOTE — Telephone Encounter (Signed)
CPX with pap done 02/23/10 please advise      KP

## 2010-05-20 MED ORDER — SYNTHROID 112 MCG PO TABS: 112.0000 ug | ORAL_TABLET | Freq: Every day | ORAL | Status: DC

## 2010-05-20 MED ORDER — LANSOPRAZOLE 30 MG PO CPDR: 30.0000 mg | DELAYED_RELEASE_CAPSULE | Freq: Every day | ORAL | Status: DC

## 2010-05-20 NOTE — Telephone Encounter (Signed)
Rx's printed, will mail out to patient tomorrow after Dr.Lowne signs      KP

## 2010-08-27 ENCOUNTER — Ambulatory Visit (INDEPENDENT_AMBULATORY_CARE_PROVIDER_SITE_OTHER): Payer: BC Managed Care – PPO | Admitting: *Deleted

## 2010-08-27 DIAGNOSIS — Z0000 Encounter for general adult medical examination without abnormal findings: Secondary | ICD-10-CM

## 2011-01-08 ENCOUNTER — Other Ambulatory Visit: Payer: Self-pay | Admitting: Family Medicine

## 2011-01-08 MED ORDER — SYNTHROID 112 MCG PO TABS: 112.0000 ug | ORAL_TABLET | Freq: Every day | ORAL | Status: DC

## 2011-01-08 NOTE — Telephone Encounter (Signed)
Rx faxed.    KP 

## 2011-04-02 ENCOUNTER — Other Ambulatory Visit: Payer: Self-pay | Admitting: Family Medicine

## 2011-04-02 MED ORDER — SYNTHROID 112 MCG PO TABS: 112.0000 ug | ORAL_TABLET | Freq: Every day | ORAL | Status: DC

## 2011-04-02 NOTE — Telephone Encounter (Signed)
Faxed.   KP 

## 2011-05-12 ENCOUNTER — Telehealth: Payer: Self-pay | Admitting: Family Medicine

## 2011-05-12 NOTE — Telephone Encounter (Signed)
Patient would like to come in a week prior to her CPE scheduled for 6.4.13. I have put her on lab Schedule for 5.28 at 8am, could you please put in lab orders Thanks

## 2011-05-13 ENCOUNTER — Other Ambulatory Visit: Payer: Self-pay | Admitting: Family Medicine

## 2011-05-13 DIAGNOSIS — Z0000 Encounter for general adult medical examination without abnormal findings: Secondary | ICD-10-CM

## 2011-05-13 DIAGNOSIS — E039 Hypothyroidism, unspecified: Secondary | ICD-10-CM

## 2011-05-13 NOTE — Telephone Encounter (Signed)
Please advise      KP 

## 2011-05-13 NOTE — Telephone Encounter (Signed)
Orders in the system

## 2011-05-13 NOTE — Telephone Encounter (Signed)
Lab order in!

## 2011-06-07 ENCOUNTER — Telehealth: Payer: Self-pay | Admitting: Family Medicine

## 2011-06-07 DIAGNOSIS — F329 Major depressive disorder, single episode, unspecified: Secondary | ICD-10-CM

## 2011-06-07 MED ORDER — LANSOPRAZOLE 30 MG PO CPDR: 30.0000 mg | DELAYED_RELEASE_CAPSULE | Freq: Every day | ORAL | Status: DC

## 2011-06-07 MED ORDER — VENLAFAXINE HCL ER 75 MG PO CP24: 75.0000 mg | ORAL_CAPSULE | Freq: Every day | ORAL | Status: DC

## 2011-06-07 MED ORDER — SYNTHROID 112 MCG PO TABS: 112.0000 ug | ORAL_TABLET | Freq: Every day | ORAL | Status: DC

## 2011-06-07 NOTE — Telephone Encounter (Signed)
CPE and Lab Apt pending.       KP

## 2011-06-07 NOTE — Telephone Encounter (Signed)
Refill: Synthroid . Once by mouth a day. Lansoprazole cap 30mg . Take once day by mouth. Venlafaxine er cap 75mg / Take once by mouth a day.

## 2011-07-06 ENCOUNTER — Other Ambulatory Visit (INDEPENDENT_AMBULATORY_CARE_PROVIDER_SITE_OTHER): Payer: BC Managed Care – PPO

## 2011-07-06 ENCOUNTER — Other Ambulatory Visit: Payer: BC Managed Care – PPO

## 2011-07-06 DIAGNOSIS — E039 Hypothyroidism, unspecified: Secondary | ICD-10-CM

## 2011-07-06 DIAGNOSIS — Z0000 Encounter for general adult medical examination without abnormal findings: Secondary | ICD-10-CM

## 2011-07-06 LAB — CBC WITH DIFFERENTIAL/PLATELET
Basophils Relative: 1 % (ref 0.0–3.0)
Eosinophils Relative: 1.7 % (ref 0.0–5.0)
HCT: 40 % (ref 36.0–46.0)
Lymphs Abs: 2.1 10*3/uL (ref 0.7–4.0)
Monocytes Relative: 7.2 % (ref 3.0–12.0)
Neutro Abs: 3.6 10*3/uL (ref 1.4–7.7)
Neutrophils Relative %: 57.3 % (ref 43.0–77.0)
Platelets: 204 10*3/uL (ref 150.0–400.0)
RBC: 4.35 Mil/uL (ref 3.87–5.11)
RDW: 14.6 % (ref 11.5–14.6)

## 2011-07-06 LAB — HEPATIC FUNCTION PANEL
ALT: 22 U/L (ref 0–35)
Alkaline Phosphatase: 38 U/L — ABNORMAL LOW (ref 39–117)
Total Protein: 6.8 g/dL (ref 6.0–8.3)

## 2011-07-06 LAB — URINALYSIS: Urobilinogen, UA: 0.2 (ref 0.0–1.0)

## 2011-07-06 LAB — LIPID PANEL
HDL: 45.8 mg/dL (ref >39.00)
VLDL: 84.2 mg/dL — ABNORMAL HIGH (ref 0.0–40.0)

## 2011-07-06 LAB — BASIC METABOLIC PANEL: Glucose, Bld: 104 mg/dL — ABNORMAL HIGH (ref 70–99)

## 2011-07-06 NOTE — Progress Notes (Signed)
Labs only

## 2011-07-13 ENCOUNTER — Ambulatory Visit (INDEPENDENT_AMBULATORY_CARE_PROVIDER_SITE_OTHER): Payer: BC Managed Care – PPO | Admitting: Family Medicine

## 2011-07-13 ENCOUNTER — Encounter: Payer: Self-pay | Admitting: Family Medicine

## 2011-07-13 ENCOUNTER — Other Ambulatory Visit (HOSPITAL_COMMUNITY)
Admission: RE | Admit: 2011-07-13 | Discharge: 2011-07-13 | Disposition: A | Discharge status: Home / Self Care | Payer: BC Managed Care – PPO | Source: Ambulatory Visit | Attending: Family Medicine | Admitting: Family Medicine

## 2011-07-13 VITALS — BP 142/84 | HR 94 | Temp 98.4°F | Ht 66.0 in | Wt 177.6 lb

## 2011-07-13 DIAGNOSIS — Z01419 Encounter for gynecological examination (general) (routine) without abnormal findings: Secondary | ICD-10-CM | POA: Insufficient documentation

## 2011-07-13 DIAGNOSIS — E039 Hypothyroidism, unspecified: Secondary | ICD-10-CM

## 2011-07-13 DIAGNOSIS — I10 Essential (primary) hypertension: Secondary | ICD-10-CM

## 2011-07-13 DIAGNOSIS — F329 Major depressive disorder, single episode, unspecified: Secondary | ICD-10-CM

## 2011-07-13 DIAGNOSIS — Z0000 Encounter for general adult medical examination without abnormal findings: Secondary | ICD-10-CM

## 2011-07-13 DIAGNOSIS — M1990 Unspecified osteoarthritis, unspecified site: Secondary | ICD-10-CM

## 2011-07-13 DIAGNOSIS — L259 Unspecified contact dermatitis, unspecified cause: Secondary | ICD-10-CM

## 2011-07-13 DIAGNOSIS — E785 Hyperlipidemia, unspecified: Secondary | ICD-10-CM

## 2011-07-13 DIAGNOSIS — Z1239 Encounter for other screening for malignant neoplasm of breast: Secondary | ICD-10-CM

## 2011-07-13 DIAGNOSIS — Z124 Encounter for screening for malignant neoplasm of cervix: Secondary | ICD-10-CM

## 2011-07-13 MED ORDER — SYNTHROID 112 MCG PO TABS: 112.0000 ug | ORAL_TABLET | Freq: Every day | ORAL | Status: DC

## 2011-07-13 MED ORDER — MOMETASONE FUROATE 0.1 % EX CREA: TOPICAL_CREAM | Freq: Every day | CUTANEOUS | Status: DC

## 2011-07-13 MED ORDER — VENLAFAXINE HCL ER 75 MG PO CP24: 75.0000 mg | ORAL_CAPSULE | Freq: Every day | ORAL | Status: DC

## 2011-07-13 MED ORDER — METOPROLOL TARTRATE 25 MG PO TABS: 25.0000 mg | ORAL_TABLET | Freq: Two times a day (BID) | ORAL | Status: DC

## 2011-07-13 MED ORDER — FENOFIBRATE 145 MG PO TABS: 145.0000 mg | ORAL_TABLET | Freq: Every day | ORAL | Status: DC

## 2011-07-13 MED ORDER — TRAMADOL HCL 50 MG PO TABS: 50.0000 mg | ORAL_TABLET | Freq: Three times a day (TID) | ORAL | Status: AC | PRN

## 2011-07-13 MED ORDER — ATORVASTATIN CALCIUM 40 MG PO TABS: 40.0000 mg | ORAL_TABLET | Freq: Every day | ORAL | Status: DC

## 2011-07-13 NOTE — Assessment & Plan Note (Signed)
Check labs 

## 2011-07-13 NOTE — Patient Instructions (Signed)
Preventive Care for Adults, Female A healthy lifestyle and preventive care can promote health and wellness. Preventive health guidelines for women include the following key practices.  A routine yearly physical is a good way to check with your caregiver about your health and preventive screening. It is a chance to share any concerns and updates on your health, and to receive a thorough exam.   Visit your dentist for a routine exam and preventive care every 6 months. Brush your teeth twice a day and floss once a day. Good oral hygiene prevents tooth decay and gum disease.   The frequency of eye exams is based on your age, health, family medical history, use of contact lenses, and other factors. Follow your caregiver's recommendations for frequency of eye exams.   Eat a healthy diet. Foods like vegetables, fruits, whole grains, low-fat dairy products, and lean protein foods contain the nutrients you need without too many calories. Decrease your intake of foods high in solid fats, added sugars, and salt. Eat the right amount of calories for you.Get information about a proper diet from your caregiver, if necessary.   Regular physical exercise is one of the most important things you can do for your health. Most adults should get at least 150 minutes of moderate-intensity exercise (any activity that increases your heart rate and causes you to sweat) each week. In addition, most adults need muscle-strengthening exercises on 2 or more days a week.   Maintain a healthy weight. The body mass index (BMI) is a screening tool to identify possible weight problems. It provides an estimate of body fat based on height and weight. Your caregiver can help determine your BMI, and can help you achieve or maintain a healthy weight.For adults 20 years and older:   A BMI below 18.5 is considered underweight.   A BMI of 18.5 to 24.9 is normal.   A BMI of 25 to 29.9 is considered overweight.   A BMI of 30 and above is  considered obese.   Maintain normal blood lipids and cholesterol levels by exercising and minimizing your intake of saturated fat. Eat a balanced diet with plenty of fruit and vegetables. Blood tests for lipids and cholesterol should begin at age 20 and be repeated every 5 years. If your lipid or cholesterol levels are high, you are over 50, or you are at high risk for heart disease, you may need your cholesterol levels checked more frequently.Ongoing high lipid and cholesterol levels should be treated with medicines if diet and exercise are not effective.   If you smoke, find out from your caregiver how to quit. If you do not use tobacco, do not start.   If you are pregnant, do not drink alcohol. If you are breastfeeding, be very cautious about drinking alcohol. If you are not pregnant and choose to drink alcohol, do not exceed 1 drink per day. One drink is considered to be 12 ounces (355 mL) of beer, 5 ounces (148 mL) of wine, or 1.5 ounces (44 mL) of liquor.   Avoid use of street drugs. Do not share needles with anyone. Ask for help if you need support or instructions about stopping the use of drugs.   High blood pressure causes heart disease and increases the risk of stroke. Your blood pressure should be checked at least every 1 to 2 years. Ongoing high blood pressure should be treated with medicines if weight loss and exercise are not effective.   If you are 55 to 63   years old, ask your caregiver if you should take aspirin to prevent strokes.   Diabetes screening involves taking a blood sample to check your fasting blood sugar level. This should be done once every 3 years, after age 45, if you are within normal weight and without risk factors for diabetes. Testing should be considered at a younger age or be carried out more frequently if you are overweight and have at least 1 risk factor for diabetes.   Breast cancer screening is essential preventive care for women. You should practice "breast  self-awareness." This means understanding the normal appearance and feel of your breasts and may include breast self-examination. Any changes detected, no matter how small, should be reported to a caregiver. Women in their 20s and 30s should have a clinical breast exam (CBE) by a caregiver as part of a regular health exam every 1 to 3 years. After age 40, women should have a CBE every year. Starting at age 40, women should consider having a mammography (breast X-ray test) every year. Women who have a family history of breast cancer should talk to their caregiver about genetic screening. Women at a high risk of breast cancer should talk to their caregivers about having magnetic resonance imaging (MRI) and a mammography every year.   The Pap test is a screening test for cervical cancer. A Pap test can show cell changes on the cervix that might become cervical cancer if left untreated. A Pap test is a procedure in which cells are obtained and examined from the lower end of the uterus (cervix).   Women should have a Pap test starting at age 21.   Between ages 21 and 29, Pap tests should be repeated every 2 years.   Beginning at age 30, you should have a Pap test every 3 years as long as the past 3 Pap tests have been normal.   Some women have medical problems that increase the chance of getting cervical cancer. Talk to your caregiver about these problems. It is especially important to talk to your caregiver if a new problem develops soon after your last Pap test. In these cases, your caregiver may recommend more frequent screening and Pap tests.   The above recommendations are the same for women who have or have not gotten the vaccine for human papillomavirus (HPV).   If you had a hysterectomy for a problem that was not cancer or a condition that could lead to cancer, then you no longer need Pap tests. Even if you no longer need a Pap test, a regular exam is a good idea to make sure no other problems are  starting.   If you are between ages 65 and 70, and you have had normal Pap tests going back 10 years, you no longer need Pap tests. Even if you no longer need a Pap test, a regular exam is a good idea to make sure no other problems are starting.   If you have had past treatment for cervical cancer or a condition that could lead to cancer, you need Pap tests and screening for cancer for at least 20 years after your treatment.   If Pap tests have been discontinued, risk factors (such as a new sexual partner) need to be reassessed to determine if screening should be resumed.   The HPV test is an additional test that may be used for cervical cancer screening. The HPV test looks for the virus that can cause the cell changes on the cervix.   The cells collected during the Pap test can be tested for HPV. The HPV test could be used to screen women aged 30 years and older, and should be used in women of any age who have unclear Pap test results. After the age of 30, women should have HPV testing at the same frequency as a Pap test.   Colorectal cancer can be detected and often prevented. Most routine colorectal cancer screening begins at the age of 50 and continues through age 75. However, your caregiver may recommend screening at an earlier age if you have risk factors for colon cancer. On a yearly basis, your caregiver may provide home test kits to check for hidden blood in the stool. Use of a small camera at the end of a tube, to directly examine the colon (sigmoidoscopy or colonoscopy), can detect the earliest forms of colorectal cancer. Talk to your caregiver about this at age 50, when routine screening begins. Direct examination of the colon should be repeated every 5 to 10 years through age 75, unless early forms of pre-cancerous polyps or small growths are found.   Hepatitis C blood testing is recommended for all people born from 1945 through 1965 and any individual with known risks for hepatitis C.    Practice safe sex. Use condoms and avoid high-risk sexual practices to reduce the spread of sexually transmitted infections (STIs). STIs include gonorrhea, chlamydia, syphilis, trichomonas, herpes, HPV, and human immunodeficiency virus (HIV). Herpes, HIV, and HPV are viral illnesses that have no cure. They can result in disability, cancer, and death. Sexually active women aged 25 and younger should be checked for chlamydia. Older women with new or multiple partners should also be tested for chlamydia. Testing for other STIs is recommended if you are sexually active and at increased risk.   Osteoporosis is a disease in which the bones lose minerals and strength with aging. This can result in serious bone fractures. The risk of osteoporosis can be identified using a bone density scan. Women ages 65 and over and women at risk for fractures or osteoporosis should discuss screening with their caregivers. Ask your caregiver whether you should take a calcium supplement or vitamin D to reduce the rate of osteoporosis.   Menopause can be associated with physical symptoms and risks. Hormone replacement therapy is available to decrease symptoms and risks. You should talk to your caregiver about whether hormone replacement therapy is right for you.   Use sunscreen with sun protection factor (SPF) of 30 or more. Apply sunscreen liberally and repeatedly throughout the day. You should seek shade when your shadow is shorter than you. Protect yourself by wearing long sleeves, pants, a wide-brimmed hat, and sunglasses year round, whenever you are outdoors.   Once a month, do a whole body skin exam, using a mirror to look at the skin on your back. Notify your caregiver of new moles, moles that have irregular borders, moles that are larger than a pencil eraser, or moles that have changed in shape or color.   Stay current with required immunizations.   Influenza. You need a dose every fall (or winter). The composition of  the flu vaccine changes each year, so being vaccinated once is not enough.   Pneumococcal polysaccharide. You need 1 to 2 doses if you smoke cigarettes or if you have certain chronic medical conditions. You need 1 dose at age 65 (or older) if you have never been vaccinated.   Tetanus, diphtheria, pertussis (Tdap, Td). Get 1 dose of   Tdap vaccine if you are younger than age 65, are over 65 and have contact with an infant, are a healthcare worker, are pregnant, or simply want to be protected from whooping cough. After that, you need a Td booster dose every 10 years. Consult your caregiver if you have not had at least 3 tetanus and diphtheria-containing shots sometime in your life or have a deep or dirty wound.   HPV. You need this vaccine if you are a woman age 26 or younger. The vaccine is given in 3 doses over 6 months.   Measles, mumps, rubella (MMR). You need at least 1 dose of MMR if you were born in 1957 or later. You may also need a second dose.   Meningococcal. If you are age 19 to 21 and a first-year college student living in a residence hall, or have one of several medical conditions, you need to get vaccinated against meningococcal disease. You may also need additional booster doses.   Zoster (shingles). If you are age 60 or older, you should get this vaccine.   Varicella (chickenpox). If you have never had chickenpox or you were vaccinated but received only 1 dose, talk to your caregiver to find out if you need this vaccine.   Hepatitis A. You need this vaccine if you have a specific risk factor for hepatitis A virus infection or you simply wish to be protected from this disease. The vaccine is usually given as 2 doses, 6 to 18 months apart.   Hepatitis B. You need this vaccine if you have a specific risk factor for hepatitis B virus infection or you simply wish to be protected from this disease. The vaccine is given in 3 doses, usually over 6 months.  Preventive Services /  Frequency Ages 19 to 39  Blood pressure check.** / Every 1 to 2 years.   Lipid and cholesterol check.** / Every 5 years beginning at age 20.   Clinical breast exam.** / Every 3 years for women in their 20s and 30s.   Pap test.** / Every 2 years from ages 21 through 29. Every 3 years starting at age 30 through age 65 or 70 with a history of 3 consecutive normal Pap tests.   HPV screening.** / Every 3 years from ages 30 through ages 65 to 70 with a history of 3 consecutive normal Pap tests.   Hepatitis C blood test.** / For any individual with known risks for hepatitis C.   Skin self-exam. / Monthly.   Influenza immunization.** / Every year.   Pneumococcal polysaccharide immunization.** / 1 to 2 doses if you smoke cigarettes or if you have certain chronic medical conditions.   Tetanus, diphtheria, pertussis (Tdap, Td) immunization. / A one-time dose of Tdap vaccine. After that, you need a Td booster dose every 10 years.   HPV immunization. / 3 doses over 6 months, if you are 26 and younger.   Measles, mumps, rubella (MMR) immunization. / You need at least 1 dose of MMR if you were born in 1957 or later. You may also need a second dose.   Meningococcal immunization. / 1 dose if you are age 19 to 21 and a first-year college student living in a residence hall, or have one of several medical conditions, you need to get vaccinated against meningococcal disease. You may also need additional booster doses.   Varicella immunization.** / Consult your caregiver.   Hepatitis A immunization.** / Consult your caregiver. 2 doses, 6 to 18 months   apart.   Hepatitis B immunization.** / Consult your caregiver. 3 doses usually over 6 months.  Ages 40 to 64  Blood pressure check.** / Every 1 to 2 years.   Lipid and cholesterol check.** / Every 5 years beginning at age 20.   Clinical breast exam.** / Every year after age 40.   Mammogram.** / Every year beginning at age 40 and continuing for as  long as you are in good health. Consult with your caregiver.   Pap test.** / Every 3 years starting at age 30 through age 65 or 70 with a history of 3 consecutive normal Pap tests.   HPV screening.** / Every 3 years from ages 30 through ages 65 to 70 with a history of 3 consecutive normal Pap tests.   Fecal occult blood test (FOBT) of stool. / Every year beginning at age 50 and continuing until age 75. You may not need to do this test if you get a colonoscopy every 10 years.   Flexible sigmoidoscopy or colonoscopy.** / Every 5 years for a flexible sigmoidoscopy or every 10 years for a colonoscopy beginning at age 50 and continuing until age 75.   Hepatitis C blood test.** / For all people born from 1945 through 1965 and any individual with known risks for hepatitis C.   Skin self-exam. / Monthly.   Influenza immunization.** / Every year.   Pneumococcal polysaccharide immunization.** / 1 to 2 doses if you smoke cigarettes or if you have certain chronic medical conditions.   Tetanus, diphtheria, pertussis (Tdap, Td) immunization.** / A one-time dose of Tdap vaccine. After that, you need a Td booster dose every 10 years.   Measles, mumps, rubella (MMR) immunization. / You need at least 1 dose of MMR if you were born in 1957 or later. You may also need a second dose.   Varicella immunization.** / Consult your caregiver.   Meningococcal immunization.** / Consult your caregiver.   Hepatitis A immunization.** / Consult your caregiver. 2 doses, 6 to 18 months apart.   Hepatitis B immunization.** / Consult your caregiver. 3 doses, usually over 6 months.  Ages 65 and over  Blood pressure check.** / Every 1 to 2 years.   Lipid and cholesterol check.** / Every 5 years beginning at age 20.   Clinical breast exam.** / Every year after age 40.   Mammogram.** / Every year beginning at age 40 and continuing for as long as you are in good health. Consult with your caregiver.   Pap test.** /  Every 3 years starting at age 30 through age 65 or 70 with a 3 consecutive normal Pap tests. Testing can be stopped between 65 and 70 with 3 consecutive normal Pap tests and no abnormal Pap or HPV tests in the past 10 years.   HPV screening.** / Every 3 years from ages 30 through ages 65 or 70 with a history of 3 consecutive normal Pap tests. Testing can be stopped between 65 and 70 with 3 consecutive normal Pap tests and no abnormal Pap or HPV tests in the past 10 years.   Fecal occult blood test (FOBT) of stool. / Every year beginning at age 50 and continuing until age 75. You may not need to do this test if you get a colonoscopy every 10 years.   Flexible sigmoidoscopy or colonoscopy.** / Every 5 years for a flexible sigmoidoscopy or every 10 years for a colonoscopy beginning at age 50 and continuing until age 75.   Hepatitis   C blood test.** / For all people born from 1945 through 1965 and any individual with known risks for hepatitis C.   Osteoporosis screening.** / A one-time screening for women ages 65 and over and women at risk for fractures or osteoporosis.   Skin self-exam. / Monthly.   Influenza immunization.** / Every year.   Pneumococcal polysaccharide immunization.** / 1 dose at age 65 (or older) if you have never been vaccinated.   Tetanus, diphtheria, pertussis (Tdap, Td) immunization. / A one-time dose of Tdap vaccine if you are over 65 and have contact with an infant, are a healthcare worker, or simply want to be protected from whooping cough. After that, you need a Td booster dose every 10 years.   Varicella immunization.** / Consult your caregiver.   Meningococcal immunization.** / Consult your caregiver.   Hepatitis A immunization.** / Consult your caregiver. 2 doses, 6 to 18 months apart.   Hepatitis B immunization.** / Check with your caregiver. 3 doses, usually over 6 months.  ** Family history and personal history of risk and conditions may change your caregiver's  recommendations. Document Released: 03/23/2001 Document Revised: 01/14/2011 Document Reviewed: 06/22/2010 [employer] Patient Information 2012 [employer]. 

## 2011-07-13 NOTE — Progress Notes (Signed)
Subjective:     Samantha Hanson is a 61 y.o. female and is here for a comprehensive physical exam. The patient reports no problems.  History   Social History  . Marital Status: Married    Spouse Name: N/A    Number of Children: N/A  . Years of Education: N/A   Occupational History  . Not on file.   Social History Main Topics  . Smoking status: Never Smoker   . Smokeless tobacco: Never Used  . Alcohol Use: No  . Drug Use: No  . Sexually Active: Not on file   Other Topics Concern  . Not on file   Social History Narrative  . No narrative on file   Health Maintenance  Topic Date Due  . Colonoscopy  08/07/1998  . Influenza Vaccine  11/09/2011  . Mammogram  03/05/2012  . Pap Smear  02/23/2013  . Tetanus/tdap  08/20/2014  . Zostavax  Completed    The following portions of the patient's history were reviewed and updated as appropriate: allergies, current medications, past family history, past medical history, past social history, past surgical history and problem list.  Review of Systems. Review of Systems  Constitutional: Negative for activity change, appetite change and fatigue.  HENT: Negative for hearing loss, congestion, tinnitus and ear discharge.  dentist q54m Eyes: Negative for visual disturbance (see optho q1y -- vision corrected to 20/20 with glasses).  Respiratory: Negative for cough, chest tightness and shortness of breath.   Cardiovascular: Negative for chest pain, palpitations and leg swelling.  Gastrointestinal: Negative for abdominal pain, diarrhea, constipation and abdominal distention.  Genitourinary: Negative for urgency, frequency, decreased urine volume and difficulty urinating.  Musculoskeletal: Negative for back pain, arthralgias and gait problem.  Skin: Negative for color change, pallor and rash.  Neurological: Negative for dizziness, light-headedness, numbness and headaches.  Hematological: Negative for adenopathy. Does not bruise/bleed easily.    Psychiatric/Behavioral: Negative for suicidal ideas, confusion, sleep disturbance, self-injury, dysphoric mood, decreased concentration and agitation.       Objective:    BP 142/84  Pulse 94  Temp(Src) 98.4 F (36.9 C) (Oral)  Ht 5\' 6"  (1.676 m)  Wt 177 lb 9.6 oz (80.559 kg)  BMI 28.67 kg/m2  SpO2 98% General appearance: alert, cooperative, appears stated age and no distress Head: Normocephalic, without obvious abnormality, atraumatic Eyes: conjunctivae/corneas clear. PERRL, EOM's intact. Fundi benign. Ears: normal TM's and external ear canals both ears Nose: Nares normal. Septum midline. Mucosa normal. No drainage or sinus tenderness. Throat: lips, mucosa, and tongue normal; teeth and gums normal Neck: no adenopathy, no carotid bruit, no JVD, supple, symmetrical, trachea midline and thyroid not enlarged, symmetric, no tenderness/mass/nodules Back: symmetric, no curvature. ROM normal. No CVA tenderness. Lungs: clear to auscultation bilaterally Breasts: normal appearance, no masses or tenderness, No nipple retraction or dimpling Heart: regular rate and rhythm, S1, S2 normal, no murmur, click, rub or gallop Abdomen: soft, non-tender; bowel sounds normal; no masses,  no organomegaly Pelvic: cervix normal in appearance, external genitalia normal, no adnexal masses or tenderness, no cervical motion tenderness, rectovaginal septum normal, uterus normal size, shape, and consistency and vagina normal without discharge Extremities: extremities normal, atraumatic, no cyanosis or edema Pulses: 2+ and symmetric Skin: Skin color, texture, turgor normal. No rashes or lesions Lymph nodes: Cervical, supraclavicular, and axillary nodes normal. Neurologic: Alert and oriented X 3, normal strength and tone. Normal symmetric reflexes. Normal coordination and gait psych--no depression / anxiety    Assessment:    Healthy  female exam.      Plan:     See After Visit Summary for Counseling  Recommendations

## 2011-07-13 NOTE — Assessment & Plan Note (Signed)
Stable Cont meds 

## 2011-08-03 ENCOUNTER — Other Ambulatory Visit: Payer: Self-pay

## 2011-08-03 MED ORDER — HYDROCODONE-ACETAMINOPHEN 5-500 MG PO TABS: 1.0000 | ORAL_TABLET | Freq: Four times a day (QID) | ORAL | Status: DC | PRN

## 2011-08-03 NOTE — Telephone Encounter (Signed)
Last seen 07/13/11 and filled 3/12. Say she gets 30 once a year. Please advise    KP

## 2011-08-03 NOTE — Telephone Encounter (Signed)
Refill x1 

## 2011-08-03 NOTE — Telephone Encounter (Signed)
Faxed.   KP 

## 2011-08-10 ENCOUNTER — Encounter: Payer: Self-pay | Admitting: Family Medicine

## 2011-09-09 ENCOUNTER — Telehealth: Payer: Self-pay | Admitting: Family Medicine

## 2011-09-09 MED ORDER — LANSOPRAZOLE 30 MG PO CPDR: 30.0000 mg | DELAYED_RELEASE_CAPSULE | Freq: Every day | ORAL | Status: DC

## 2011-09-09 NOTE — Addendum Note (Signed)
Addended by: Arnette Norris on: 09/09/2011 08:48 AM   Modules accepted: Orders

## 2011-09-09 NOTE — Telephone Encounter (Signed)
Refill: Lansoprazole cap 30mg  dr. Zachery Conch 1 by mouth daily. 90 day supply

## 2011-09-13 ENCOUNTER — Telehealth: Payer: Self-pay | Admitting: Family Medicine

## 2011-09-13 DIAGNOSIS — F329 Major depressive disorder, single episode, unspecified: Secondary | ICD-10-CM

## 2011-09-13 MED ORDER — VENLAFAXINE HCL ER 75 MG PO CP24: 75.0000 mg | ORAL_CAPSULE | Freq: Every day | ORAL | Status: DC

## 2011-09-13 NOTE — Telephone Encounter (Signed)
Refill: Venlafaxine er cap 75mg . Take 1 by mouth daily. 90 day supply

## 2011-09-24 ENCOUNTER — Encounter: Payer: Self-pay | Admitting: Gastroenterology

## 2011-09-27 ENCOUNTER — Telehealth: Payer: Self-pay | Admitting: Family Medicine

## 2011-09-27 NOTE — Telephone Encounter (Signed)
In reference to GI referral entered on 07/13/11 for a screening colonoscopy, the patient was called multiple times, and mailed a letter about scheduling.  As of 09/24/11, patient made the decision not to schedule.

## 2011-10-29 ENCOUNTER — Encounter: Payer: Self-pay | Admitting: Internal Medicine

## 2011-10-29 ENCOUNTER — Telehealth: Payer: Self-pay | Admitting: Family Medicine

## 2011-10-29 ENCOUNTER — Ambulatory Visit (HOSPITAL_BASED_OUTPATIENT_CLINIC_OR_DEPARTMENT_OTHER)
Admission: RE | Admit: 2011-10-29 | Discharge: 2011-10-29 | Disposition: A | Discharge status: Home / Self Care | Payer: BC Managed Care – PPO | Source: Ambulatory Visit | Attending: Internal Medicine | Admitting: Internal Medicine

## 2011-10-29 ENCOUNTER — Ambulatory Visit (INDEPENDENT_AMBULATORY_CARE_PROVIDER_SITE_OTHER): Payer: BC Managed Care – PPO | Admitting: Internal Medicine

## 2011-10-29 VITALS — BP 138/90 | HR 81 | Temp 98.3°F | Wt 185.0 lb

## 2011-10-29 DIAGNOSIS — Z8673 Personal history of transient ischemic attack (TIA), and cerebral infarction without residual deficits: Secondary | ICD-10-CM | POA: Insufficient documentation

## 2011-10-29 DIAGNOSIS — R609 Edema, unspecified: Secondary | ICD-10-CM

## 2011-10-29 DIAGNOSIS — R51 Headache: Secondary | ICD-10-CM | POA: Insufficient documentation

## 2011-10-29 DIAGNOSIS — I10 Essential (primary) hypertension: Secondary | ICD-10-CM | POA: Insufficient documentation

## 2011-10-29 DIAGNOSIS — R519 Headache, unspecified: Secondary | ICD-10-CM | POA: Insufficient documentation

## 2011-10-29 LAB — BASIC METABOLIC PANEL
Calcium: 9.5 mg/dL (ref 8.4–10.5)
GFR: 80.41 mL/min (ref >60.00)
Sodium: 136 mEq/L (ref 135–145)

## 2011-10-29 MED ORDER — TRIAMTERENE-HCTZ 37.5-25 MG PO TABS: 0.5000 | ORAL_TABLET | Freq: Every day | ORAL | Status: DC

## 2011-10-29 NOTE — Progress Notes (Signed)
  Subjective:    Patient ID: Samantha Hanson, female    DOB: [birth year], 61 y.o.   MRN: [id number]  HPI Acute visit ~6 days ago she was feeling lightheaded, decided to check her BP and it was 197/108, later on that day it was 159/80. Along with that has noted some lower extremity edema. She called our answering service and is here for evaluation. She's not taking any new medication. Admits to taking Advil to 4 times a week, her salt intake has also increased some. Her med list is reviewed, she has discontinued cholesterol medication and I encouraged her to talk with her PCP about that  Past Medical History  Diagnosis Date  . Hyperlipidemia   . Thyroid disease   . MVP (mitral valve prolapse)   . Migraine    Past Surgical History  Procedure Date  . Bunionectomy     Right foot     Review of Systems No CP but reports on-off tightness at the anterior neck x 1 month, at rest, better if she moves around; denies chest pain per se. Denies shortness of breath Occasional has palpitations which is nothing new for her. She has a history of migraines, for the last month, she has been experiencing a "different type of headache", mostly on the right side of the head and neck. Is not the worst of her life. Denies any diplopia, slurred speech or focal deficits.     Objective:   Physical Exam General -- alert, well-developed, and overweight appearing. No apparent distress.  Neck --no thyromegaly Lungs -- normal respiratory effort, no intercostal retractions, no accessory muscle use, and normal breath sounds.   Heart-- normal rate, regular rhythm, no murmur, and no gallop.   Abdomen--soft, non-tender, no distention, no masses, no HSM, no guarding, and no rigidity.   Extremities--  +/+++ pitting edema around the ankles  Neurologic-- alert & oriented X3, speech clear, gait normal, EOMI, face symmetric strength normal in all extremities. Psych-- Cognition and judgment appear intact. Alert and  cooperative with normal attention span and concentration.  not anxious appearing and not depressed appearing.       Assessment & Plan:   Presents today with recently  elevated BP readings, mild lower extremity edema,  a different headache compared to migraines, a ill defined throat-neck dyscomfort. EKG today w/o acute changes BP today is only mildly elevated. Plan CT head, BMP, TSH Avoid Advil Low-salt diet Maxzide, low dose  ER if sx severe F/u w/ PCP next week

## 2011-10-29 NOTE — Patient Instructions (Addendum)
Continue with your current medications and start half tablet of maxzide  to help your blood pressure Avoid Motrin, Advil Avoid eating excessive salt Get a CT of the head Please come back and see your primary doctor next week Check the  blood pressure daily, be sure it is between 110/60 and 140/85. If it is consistently higher or lower, let us  Know ER if severe headache, chest pain or throat pain.

## 2011-10-29 NOTE — Telephone Encounter (Signed)
Caller: Emilygrace/Patient; Patient Name: Samantha Hanson; PCP: Lelon Perla.; Best Callback Phone Number: [phone]; Reason for call: Onset 10/22/11 with having in feet and lightheaded. Feet stay swollen even after rest.  Has some tingling in feet.  Has history of pins/needles in feet.  Feet are swollen about quarter more than usual.  Has had several elevated blood pressure readings with home device. Triaged using Foot non-injury with a disposition to see provider within 24 hrs due to new swelling of legs that doesn't not resolve with rest and elevation of legs.  Care advice given. Appointment  scheduled with Dr. Drue Novel at 14:30 10/29/11.  Patient demonstrated understanding.

## 2011-10-29 NOTE — Telephone Encounter (Signed)
Noted-- patient is in the office    KP

## 2011-10-31 ENCOUNTER — Other Ambulatory Visit: Payer: Self-pay | Admitting: Family Medicine

## 2011-10-31 DIAGNOSIS — N390 Urinary tract infection, site not specified: Secondary | ICD-10-CM

## 2011-10-31 MED ORDER — CIPROFLOXACIN HCL 250 MG PO TABS: 250.0000 mg | ORAL_TABLET | Freq: Two times a day (BID) | ORAL | Status: DC

## 2011-11-05 ENCOUNTER — Ambulatory Visit: Payer: BC Managed Care – PPO | Admitting: Family Medicine

## 2011-11-12 ENCOUNTER — Ambulatory Visit (INDEPENDENT_AMBULATORY_CARE_PROVIDER_SITE_OTHER): Payer: BC Managed Care – PPO | Admitting: Family Medicine

## 2011-11-12 ENCOUNTER — Encounter: Payer: Self-pay | Admitting: Family Medicine

## 2011-11-12 VITALS — BP 148/82 | HR 86 | Temp 98.8°F | Wt 182.8 lb

## 2011-11-12 DIAGNOSIS — E039 Hypothyroidism, unspecified: Secondary | ICD-10-CM

## 2011-11-12 DIAGNOSIS — I10 Essential (primary) hypertension: Secondary | ICD-10-CM

## 2011-11-12 DIAGNOSIS — F411 Generalized anxiety disorder: Secondary | ICD-10-CM

## 2011-11-12 DIAGNOSIS — R51 Headache: Secondary | ICD-10-CM

## 2011-11-12 DIAGNOSIS — F419 Anxiety disorder, unspecified: Secondary | ICD-10-CM

## 2011-11-12 DIAGNOSIS — R413 Other amnesia: Secondary | ICD-10-CM

## 2011-11-12 LAB — CBC WITH DIFFERENTIAL/PLATELET
Basophils Relative: 1 % (ref 0–1)
Hemoglobin: 13 g/dL (ref 12.0–15.0)
Lymphs Abs: 2.5 10*3/uL (ref 0.7–4.0)
MCHC: 33 g/dL (ref 30.0–36.0)
Monocytes Relative: 5 % (ref 3–12)
Neutro Abs: 4.3 10*3/uL (ref 1.7–7.7)
Neutrophils Relative %: 59 % (ref 43–77)
RBC: 4.31 MIL/uL (ref 3.87–5.11)
WBC: 7.3 10*3/uL (ref 4.0–10.5)

## 2011-11-12 LAB — TSH: TSH: 5.254 u[IU]/mL — ABNORMAL HIGH (ref 0.350–4.500)

## 2011-11-12 LAB — VITAMIN B12: Vitamin B-12: 650 pg/mL (ref 211–911)

## 2011-11-12 MED ORDER — TRIAMTERENE-HCTZ 37.5-25 MG PO TABS: 1.0000 | ORAL_TABLET | Freq: Every day | ORAL | Status: DC

## 2011-11-12 MED ORDER — CITALOPRAM HYDROBROMIDE 10 MG PO TABS: 10.0000 mg | ORAL_TABLET | Freq: Every day | ORAL | Status: DC

## 2011-11-12 MED ORDER — ALPRAZOLAM 0.5 MG PO TABS: 0.5000 mg | ORAL_TABLET | Freq: Every evening | ORAL | Status: DC | PRN

## 2011-11-12 NOTE — Patient Instructions (Addendum)

## 2011-11-13 LAB — BASIC METABOLIC PANEL
Glucose, Bld: 79 mg/dL (ref 70–99)
Potassium: 4.2 mEq/L (ref 3.5–5.3)
Sodium: 138 mEq/L (ref 135–145)

## 2011-11-14 NOTE — Progress Notes (Signed)
  Subjective:    Patient ID: Samantha Hanson, female    DOB: 1950-03-18, 61 y.o.   MRN: [id number]  HPI Pt here to f/u bp, ct and thyroid.  See last ov, labs and CT head.   Pt bp has been running high.  It is better but still elevated.  No other complaints.   Review of Systems As above    Objective:   Physical Exam  Constitutional: She is oriented to person, place, and time. She appears well-developed and well-nourished.  Neck: Normal range of motion. Neck supple.  Cardiovascular: Normal rate, regular rhythm and normal heart sounds.   No murmur heard. Pulmonary/Chest: Effort normal and breath sounds normal. No respiratory distress. She has no wheezes. She has no rales.  Musculoskeletal: Normal range of motion. She exhibits no edema and no tenderness.  Neurological: She is alert and oriented to person, place, and time. She displays normal reflexes. No cranial nerve deficit. She exhibits normal muscle tone. Coordination normal.  Psychiatric: She has a normal mood and affect. Her behavior is normal. Judgment and thought content normal.          Assessment & Plan:

## 2011-11-14 NOTE — Assessment & Plan Note (Signed)
Recheck labs 

## 2011-11-14 NOTE — Assessment & Plan Note (Signed)
Increase maxzide to 1 tab qd

## 2011-11-14 NOTE — Assessment & Plan Note (Signed)
Resolved with better control of bp CT reviewed with pt

## 2011-11-16 ENCOUNTER — Ambulatory Visit (HOSPITAL_BASED_OUTPATIENT_CLINIC_OR_DEPARTMENT_OTHER)
Admission: RE | Admit: 2011-11-16 | Discharge: 2011-11-16 | Disposition: A | Discharge status: Home / Self Care | Payer: BC Managed Care – PPO | Source: Ambulatory Visit | Attending: Family Medicine | Admitting: Family Medicine

## 2011-11-16 DIAGNOSIS — R413 Other amnesia: Secondary | ICD-10-CM | POA: Insufficient documentation

## 2011-11-18 ENCOUNTER — Telehealth: Payer: Self-pay

## 2011-11-18 MED ORDER — LEVOTHYROXINE SODIUM 125 MCG PO TABS: 125.0000 ug | ORAL_TABLET | Freq: Every day | ORAL | Status: DC

## 2011-11-18 NOTE — Telephone Encounter (Signed)
Advised pt of lab results, ordering stronger dose of meds, and mailing out copy of lab results.   Rx sent.  MW

## 2011-12-01 ENCOUNTER — Other Ambulatory Visit: Payer: Self-pay

## 2011-12-01 MED ORDER — ALBUTEROL SULFATE HFA 108 (90 BASE) MCG/ACT IN AERS: 2.0000 | INHALATION_SPRAY | Freq: Four times a day (QID) | RESPIRATORY_TRACT | Status: DC | PRN

## 2011-12-01 NOTE — Telephone Encounter (Signed)
Rx sent pt aware.   MW  

## 2012-01-27 ENCOUNTER — Other Ambulatory Visit: Payer: Self-pay | Admitting: Family Medicine

## 2012-01-27 DIAGNOSIS — F329 Major depressive disorder, single episode, unspecified: Secondary | ICD-10-CM

## 2012-01-27 MED ORDER — VENLAFAXINE HCL ER 75 MG PO CP24: 75.0000 mg | ORAL_CAPSULE | Freq: Every day | ORAL | Status: DC

## 2012-01-27 MED ORDER — LEVOTHYROXINE SODIUM 125 MCG PO TABS: ORAL_TABLET | ORAL | Status: DC

## 2012-01-27 NOTE — Telephone Encounter (Signed)
refills x 2 last ov wt/labs 10.4.13-follow up   1-Venlafaxine HCl (Capsule SR 24 hr) 75 MG Take 1 capsule (75 mg total) by mouth daily requesting 90-day supply last wrt 8.5.13 #90 x 1-refill  2-Synthroid (Tab) , 125 MCG Take 1 tablet (125 mcg total) by mouth daily.requesting 90-day supply last wrt 6.4.13 #90 wt/3-refills  NOTE-synthroid on patients medication list twice with different MCGs

## 2012-04-28 ENCOUNTER — Telehealth: Payer: Self-pay | Admitting: Family Medicine

## 2012-04-28 NOTE — Telephone Encounter (Signed)
REFILL ON SYNTHROID 125 MCG #90  TAKE 1 BY MOUTH DAILY  * PLEASE MAKE DOCTORS  APPOINTMENT FOR LABS FOR FURTHER REFILLS*

## 2012-04-28 NOTE — Telephone Encounter (Signed)
Patient states she is out of refills. She had a physical on 07/12/11 and cannot schedule another one after that due to insurance. Please advise if pt needs ov before June. Also, patient states she has a lump on her head that she wants cut off and would like to know who can do this. She saw Dr. Drue Novel about this last fall. Please advise.

## 2012-04-28 NOTE — Telephone Encounter (Signed)
This patient will need an evaluation of the lump on her head and she needs an OV for BP check with fasting labs.      KP  

## 2012-05-01 MED ORDER — LEVOTHYROXINE SODIUM 125 MCG PO TABS: ORAL_TABLET | ORAL | Status: DC

## 2012-05-01 NOTE — Telephone Encounter (Signed)
This patient will need an evaluation of the lump on her head and she needs an OV for BP check with fasting labs.      KP

## 2012-05-02 NOTE — Telephone Encounter (Signed)
lmovm for pt to call office. °

## 2012-05-03 NOTE — Telephone Encounter (Signed)
Appt scheduled

## 2012-05-04 ENCOUNTER — Encounter: Payer: Self-pay | Admitting: Lab

## 2012-05-05 ENCOUNTER — Ambulatory Visit (INDEPENDENT_AMBULATORY_CARE_PROVIDER_SITE_OTHER): Payer: BC Managed Care – PPO | Admitting: Family Medicine

## 2012-05-05 ENCOUNTER — Encounter: Payer: Self-pay | Admitting: Family Medicine

## 2012-05-05 VITALS — BP 140/78 | HR 75 | Temp 97.8°F | Wt 186.0 lb

## 2012-05-05 DIAGNOSIS — I10 Essential (primary) hypertension: Secondary | ICD-10-CM

## 2012-05-05 DIAGNOSIS — L259 Unspecified contact dermatitis, unspecified cause: Secondary | ICD-10-CM

## 2012-05-05 DIAGNOSIS — E785 Hyperlipidemia, unspecified: Secondary | ICD-10-CM

## 2012-05-05 DIAGNOSIS — R609 Edema, unspecified: Secondary | ICD-10-CM

## 2012-05-05 DIAGNOSIS — L0390 Cellulitis, unspecified: Secondary | ICD-10-CM

## 2012-05-05 DIAGNOSIS — E039 Hypothyroidism, unspecified: Secondary | ICD-10-CM

## 2012-05-05 DIAGNOSIS — R0609 Other forms of dyspnea: Secondary | ICD-10-CM

## 2012-05-05 DIAGNOSIS — L0291 Cutaneous abscess, unspecified: Secondary | ICD-10-CM

## 2012-05-05 DIAGNOSIS — R079 Chest pain, unspecified: Secondary | ICD-10-CM

## 2012-05-05 DIAGNOSIS — L723 Sebaceous cyst: Secondary | ICD-10-CM | POA: Insufficient documentation

## 2012-05-05 DIAGNOSIS — L309 Dermatitis, unspecified: Secondary | ICD-10-CM

## 2012-05-05 LAB — LIPID PANEL
Total CHOL/HDL Ratio: 10
Triglycerides: 498 mg/dL — ABNORMAL HIGH (ref 0.0–149.0)

## 2012-05-05 LAB — BASIC METABOLIC PANEL
CO2: 26 mEq/L (ref 19–32)
Chloride: 104 mEq/L (ref 96–112)
Creatinine, Ser: 0.9 mg/dL (ref 0.4–1.2)
Potassium: 4.1 mEq/L (ref 3.5–5.1)

## 2012-05-05 LAB — TSH: TSH: 1.08 u[IU]/mL (ref 0.35–5.50)

## 2012-05-05 LAB — LDL CHOLESTEROL, DIRECT: Direct LDL: 137.6 mg/dL

## 2012-05-05 LAB — HEPATIC FUNCTION PANEL
ALT: 23 U/L (ref 0–35)
AST: 18 U/L (ref 0–37)
Alkaline Phosphatase: 40 U/L (ref 39–117)
Bilirubin, Direct: 0 mg/dL (ref 0.0–0.3)
Total Protein: 6.9 g/dL (ref 6.0–8.3)

## 2012-05-05 MED ORDER — LOSARTAN POTASSIUM-HCTZ 50-12.5 MG PO TABS: 1.0000 | ORAL_TABLET | Freq: Every day | ORAL | Status: DC

## 2012-05-05 MED ORDER — PREDNISONE 10 MG PO TABS: ORAL_TABLET | ORAL | Status: DC

## 2012-05-05 MED ORDER — DOXYCYCLINE HYCLATE 100 MG PO TABS: 100.0000 mg | ORAL_TABLET | Freq: Two times a day (BID) | ORAL | Status: DC

## 2012-05-05 NOTE — Assessment & Plan Note (Signed)
Persistent and itching No relief with benedryl or otc cortisone cream  pred taper

## 2012-05-05 NOTE — Assessment & Plan Note (Signed)
abx and surgery referral

## 2012-05-05 NOTE — Progress Notes (Signed)
  Subjective:    Patient here for follow-up of elevated blood pressure.  She is getting a little exercise and is adherent to a low-salt diet.  Blood pressure is not well controlled at home. Cardiac symptoms: dyspnea and fatigue. Patient denies: chest pain, chest pressure/discomfort, exertional chest pressure/discomfort, lower extremity edema, near-syncope, orthopnea, palpitations, paroxysmal nocturnal dyspnea, syncope and tachypnea. Cardiovascular risk factors: hypertension, obesity (BMI >= 30 kg/m2) and sedentary lifestyle. Use of agents associated with hypertension: none. History of target organ damage: none.  The following portions of the patient's history were reviewed and updated as appropriate: allergies, current medications, past family history, past medical history, past social history, past surgical history and problem list.  Review of Systems Pertinent items are noted in HPI.     Objective:    BP 140/78  Pulse 75  Temp(Src) 97.8 F (36.6 C) (Oral)  Wt 186 lb (84.369 kg)  BMI 30.04 kg/m2  SpO2 97% General appearance: alert, cooperative, appears stated age and no distress Neck: no adenopathy, no carotid bruit, no JVD, supple, symmetrical, trachea midline and thyroid not enlarged, symmetric, no tenderness/mass/nodules Lungs: clear to auscultation bilaterally Heart: regular rate and rhythm, S1, S2 normal, no murmur, click, rub or gallop Extremities: extremities normal, atraumatic, no cyanosis or edema   skin-- seb cyst on scalp  And rash back of neck on R side  Assessment:    Hypertension, stage 1 . Evidence of target organ damage: none.    Plan:    Medication: discontinue maxzide and begin hyzaar. Dietary sodium restriction. Regular aerobic exercise. Check blood pressures 2-3 times weekly and record. Follow up: 2 weeks and as needed.

## 2012-05-05 NOTE — Assessment & Plan Note (Signed)
Stop maxzide and start hyzaar

## 2012-05-05 NOTE — Patient Instructions (Addendum)

## 2012-05-05 NOTE — Assessment & Plan Note (Signed)
Check labs con't synthroid 

## 2012-05-05 NOTE — Assessment & Plan Note (Signed)
Check labs con't meds 

## 2012-05-09 ENCOUNTER — Ambulatory Visit (INDEPENDENT_AMBULATORY_CARE_PROVIDER_SITE_OTHER): Payer: BC Managed Care – PPO | Admitting: General Surgery

## 2012-05-09 DIAGNOSIS — L03818 Cellulitis of other sites: Secondary | ICD-10-CM

## 2012-05-09 DIAGNOSIS — L02818 Cutaneous abscess of other sites: Secondary | ICD-10-CM | POA: Insufficient documentation

## 2012-05-09 NOTE — Patient Instructions (Addendum)
Shower tomorrow after removing the bandage. Shower daily after that point in time. Apply a dry gauze bandage and a headband to the area. Continue taking the doxycycline. Do not take the prednisone until the wound has healed. Call for heavy bleeding.

## 2012-05-09 NOTE — Progress Notes (Signed)
Patient ID: Oswaldo Conroy, female   DOB: 07/29/1950, 62 y.o.   MRN: [id number]  No chief complaint on file.   HPI DAFFNEY GREENLY is a 62 y.o. female.   HPI  She is referred by Dr. Laury Axon for evaluation of scalp cysts. She states she has an area on the right side of her scalp that was quite enlarged. It started draining some. It is not as big as it was before. She's been placed on doxycycline for. There is an area posterior to this which is smaller and is umcomfortable at times.  Past Medical History  Diagnosis Date  . Hyperlipidemia   . Thyroid disease   . MVP (mitral valve prolapse)   . Migraine     Past Surgical History  Procedure Laterality Date  . Bunionectomy      Right foot    Family History  Problem Relation Age of Onset  . Coronary artery disease Father   . Arthritis Father   . Heart disease Father     heart transplant candidate but too old--per pt  . Thyroid cancer Mother   . Osteoporosis Sister     Social History History  Substance Use Topics  . Smoking status: Never Smoker   . Smokeless tobacco: Never Used  . Alcohol Use: No    No Known Allergies  Current Outpatient Prescriptions  Medication Sig Dispense Refill  . albuterol (PROVENTIL HFA;VENTOLIN HFA) 108 (90 BASE) MCG/ACT inhaler Inhale 2 puffs into the lungs every 6 (six) hours as needed.  3.7 g  1  . ALPRAZolam (XANAX) 0.5 MG tablet Take 1 tablet (0.5 mg total) by mouth at bedtime as needed for sleep.  30 tablet  0  . calcium carbonate 1250 MG capsule Take 1,250 mg by mouth daily.      . Cholecalciferol (VITAMIN D) 2000 UNITS CAPS Take by mouth.      . ciprofloxacin (CIPRO) 250 MG tablet Take 1 tablet (250 mg total) by mouth 2 (two) times daily.  10 tablet  0  . citalopram (CELEXA) 10 MG tablet Take 1 tablet (10 mg total) by mouth daily.  30 tablet  2  . Cyanocobalamin (VITAMIN B-12 PO) Take 1 tablet by mouth daily.      . cyclobenzaprine (FLEXERIL) 10 MG tablet Take 10 mg by mouth daily as needed.       . doxycycline (VIBRA-TABS) 100 MG tablet Take 1 tablet (100 mg total) by mouth 2 (two) times daily.  20 tablet  0  . fish oil-omega-3 fatty acids 1000 MG capsule Take 1 g by mouth 2 (two) times daily.      [name] glucosamine-chondroitin 500-400 MG tablet Take 1 tablet by mouth 3 (three) times daily.      [name] HYDROcodone-acetaminophen (VICODIN) 5-500 MG per tablet Take 1 tablet by mouth every 6 (six) hours as needed.  30 tablet  0  . lansoprazole (PREVACID) 30 MG capsule Take 30 mg by mouth daily as needed.      [name] levothyroxine (SYNTHROID, LEVOTHROID) 125 MCG tablet 1 tab by mouth daily---repeat labs are due now  90 tablet  0  . losartan-hydrochlorothiazide (HYZAAR) 50-12.5 MG per tablet Take 1 tablet by mouth daily.  30 tablet  2  . metoprolol tartrate (LOPRESSOR) 25 MG tablet Take 1 tablet (25 mg total) by mouth 2 (two) times daily.  180 tablet  3  . minocycline (MINOCIN,DYNACIN) 100 MG capsule Take 100 mg by mouth 2 (two) times daily.      [name]  Multiple Vitamins-Minerals (MULTIVITAMIN PO) Take 1 tablet by mouth daily.      . predniSONE (DELTASONE) 10 MG tablet 3 po qd for 3 days then 2 po qd for 3 days the 1 po qd for 3 days  18 tablet  0  . venlafaxine XR (EFFEXOR XR) 75 MG 24 hr capsule Take 1 capsule (75 mg total) by mouth daily.  90 capsule  1   No current facility-administered medications for this visit.    Review of Systems Review of Systems  Constitutional: Negative for fever and chills.  Respiratory: Negative.   Cardiovascular: Negative.     There were no vitals taken for this visit.  Physical Exam Physical Exam  Constitutional: She appears well-developed and well-nourished. No distress.  HENT:  Head: Normocephalic.  In the right temporal-parietal area, there is a fluctuant mass draining some purulent material. Posterior to this, there is a 1 cm mobile soft tissue mass.    Data Reviewed Dr. Ernst Spell note.  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     Plan    1. Incision and drainage of the scalp abscess. Once this has healed, we can talk about removal of any of the remaining cyst cavity as well as removal of the cyst posterior to this.  Procedure: The right scalp abscess was sterilely prepped and anesthetized with Xylocaine. A cruciate incision was made and purulent material evacuated as well as cystlike material and the cyst capsule. The wound was cleaned with peroxide and a dry dressing was applied. She tolerated the procedure well and was given wound care instructions. She will followup again in 4 weeks. I've asked her not to take the prednisone. She is to continue the doxycycline.        [place] Jr J 05/09/2012, 11:31 AM

## 2012-05-15 ENCOUNTER — Ambulatory Visit (HOSPITAL_COMMUNITY): Payer: BC Managed Care – PPO | Attending: Cardiovascular Disease

## 2012-05-15 ENCOUNTER — Ambulatory Visit (INDEPENDENT_AMBULATORY_CARE_PROVIDER_SITE_OTHER): Payer: Self-pay | Admitting: Surgery

## 2012-05-15 ENCOUNTER — Encounter: Payer: Self-pay | Admitting: Family Medicine

## 2012-05-15 DIAGNOSIS — R0602 Shortness of breath: Secondary | ICD-10-CM

## 2012-05-15 DIAGNOSIS — R072 Precordial pain: Secondary | ICD-10-CM

## 2012-05-15 DIAGNOSIS — R0609 Other forms of dyspnea: Secondary | ICD-10-CM | POA: Insufficient documentation

## 2012-05-15 DIAGNOSIS — R0989 Other specified symptoms and signs involving the circulatory and respiratory systems: Secondary | ICD-10-CM | POA: Insufficient documentation

## 2012-05-15 DIAGNOSIS — R609 Edema, unspecified: Secondary | ICD-10-CM | POA: Insufficient documentation

## 2012-05-15 NOTE — Progress Notes (Signed)
Echocardiogram performed.  

## 2012-05-18 ENCOUNTER — Other Ambulatory Visit: Payer: Self-pay

## 2012-05-18 DIAGNOSIS — I10 Essential (primary) hypertension: Secondary | ICD-10-CM

## 2012-05-18 DIAGNOSIS — R9431 Abnormal electrocardiogram [ECG] [EKG]: Secondary | ICD-10-CM

## 2012-05-23 ENCOUNTER — Ambulatory Visit: Payer: BC Managed Care – PPO | Admitting: Family Medicine

## 2012-06-06 ENCOUNTER — Encounter (INDEPENDENT_AMBULATORY_CARE_PROVIDER_SITE_OTHER): Payer: BC Managed Care – PPO | Admitting: General Surgery

## 2012-06-08 ENCOUNTER — Encounter: Payer: Self-pay | Admitting: Family Medicine

## 2012-06-09 ENCOUNTER — Encounter: Payer: Self-pay | Admitting: Cardiovascular Disease

## 2012-06-09 ENCOUNTER — Ambulatory Visit (INDEPENDENT_AMBULATORY_CARE_PROVIDER_SITE_OTHER): Payer: BC Managed Care – PPO | Admitting: Cardiovascular Disease

## 2012-06-09 VITALS — BP 124/82 | HR 79 | Ht 66.0 in | Wt 186.0 lb

## 2012-06-09 DIAGNOSIS — E039 Hypothyroidism, unspecified: Secondary | ICD-10-CM

## 2012-06-09 DIAGNOSIS — R9439 Abnormal result of other cardiovascular function study: Secondary | ICD-10-CM | POA: Insufficient documentation

## 2012-06-09 DIAGNOSIS — I10 Essential (primary) hypertension: Secondary | ICD-10-CM

## 2012-06-09 MED ORDER — LOSARTAN POTASSIUM-HCTZ 100-12.5 MG PO TABS: 1.0000 | ORAL_TABLET | Freq: Every day | ORAL | Status: DC

## 2012-06-09 NOTE — Assessment & Plan Note (Signed)
Continue replacement Rx May tolerate armor thyroid better

## 2012-06-09 NOTE — Assessment & Plan Note (Signed)
Increase hyzaar to 100/125.  F/U primary

## 2012-06-09 NOTE — Addendum Note (Signed)
Addended by: Scherrie Bateman E on: 06/09/2012 10:36 AM   Modules accepted: Orders

## 2012-06-09 NOTE — Patient Instructions (Addendum)
Your physician recommends that you schedule a follow-up appointment in:  AS NEEDED  Your physician has recommended you make the following change in your medication:  INCREASE LOSARTAN /HCTZ TO 100/12.5 MG EVERYDAY   Your physician has requested that you have cardiac CT. Cardiac computed tomography (CT) is a painless test that uses an x-ray machine to take clear, detailed pictures of your heart. For further information please visit [email or web address] Please follow instruction sheet as given.

## 2012-06-09 NOTE — Progress Notes (Signed)
Patient ID: Samantha Hanson, female   DOB: [birth year], 62 y.o.   MRN: [id number] 62 yo with elevated lipids poorly controlled BP due to compliance issues and family history of CAD.  Seen by [facility] years ago with normal cath. Atypical chest pain for months. Sharp under left breast Not always exertional.  Lasts minutes. No change in pattern recently.  Reviewed stress echo from 4/7 read by Dr Jens Som ECG abnormal in recovery with normal echo images and HTN response to exercise. Needs further w/u.  Discussed options with patient. Favor Cardiac CT over invasive cath.  Will try to get approved.  She also complains about thinning hair , myalgias and difficulty with synthroid dose needing to be escalated.  Had not been very compliant with meds until recenlty  ROS: Denies fever, malais, weight loss, blurry vision, decreased visual acuity, cough, sputum, SOB, hemoptysis, pleuritic pain, palpitaitons, heartburn, abdominal pain, melena, lower extremity edema, claudication, or rash.  All other systems reviewed and negative   General: Affect appropriate Healthy:  appears stated age HEENT: normal Neck supple with no adenopathy JVP normal no bruits no thyromegaly Lungs clear with no wheezing and good diaphragmatic motion Heart:  S1/S2 no murmur,rub, gallop or click PMI normal Abdomen: benighn, BS positve, no tenderness, no AAA no bruit.  No HSM or HJR Distal pulses intact with no bruits No edema Neuro non-focal Skin warm and dry No muscular weakness  Medications Current Outpatient Prescriptions  Medication Sig Dispense Refill  . albuterol (PROVENTIL HFA;VENTOLIN HFA) 108 (90 BASE) MCG/ACT inhaler Inhale 2 puffs into the lungs every 6 (six) hours as needed.  3.7 g  1  . calcium carbonate 1250 MG capsule Take 1,250 mg by mouth daily.      . Cholecalciferol (VITAMIN D) 2000 UNITS CAPS Take by mouth.      . Cyanocobalamin (VITAMIN B-12 PO) Take 1 tablet by mouth daily.      . cyclobenzaprine (FLEXERIL)  10 MG tablet Take 10 mg by mouth daily as needed.      . fish oil-omega-3 fatty acids 1000 MG capsule Take 1 g by mouth 2 (two) times daily.      [name] glucosamine-chondroitin 500-400 MG tablet Take 1 tablet by mouth 3 (three) times daily.      [name] HYDROcodone-acetaminophen (VICODIN) 5-500 MG per tablet Take 1 tablet by mouth every 6 (six) hours as needed.  30 tablet  0  . lansoprazole (PREVACID) 30 MG capsule Take 30 mg by mouth daily as needed.      [name] levothyroxine (SYNTHROID, LEVOTHROID) 125 MCG tablet 1 tab by mouth daily---repeat labs are due now  90 tablet  0  . losartan-hydrochlorothiazide (HYZAAR) 50-12.5 MG per tablet Take 1 tablet by mouth daily.  30 tablet  2  . metoprolol tartrate (LOPRESSOR) 25 MG tablet Take 1 tablet (25 mg total) by mouth 2 (two) times daily.  180 tablet  3  . Multiple Vitamins-Minerals (MULTIVITAMIN PO) Take 1 tablet by mouth daily.      [name] venlafaxine XR (EFFEXOR XR) 75 MG 24 hr capsule Take 1 capsule (75 mg total) by mouth daily.  90 capsule  1  . predniSONE (DELTASONE) 10 MG tablet 3 po qd for 3 days then 2 po qd for 3 days the 1 po qd for 3 days  18 tablet  0   No current facility-administered medications for this visit.    Allergies Review of patient's allergies indicates no known allergies.  Family History: Family History  Problem  Relation Age of Onset  . Coronary artery disease Father   . Arthritis Father   . Heart disease Father     heart transplant candidate but too old--per pt  . Thyroid cancer Mother   . Osteoporosis Sister     Social History: History   Social History  . Marital Status: Married    Spouse Name: N/A    Number of Children: N/A  . Years of Education: N/A   Occupational History  . Not on file.   Social History Main Topics  . Smoking status: Current Every Day Smoker  . Smokeless tobacco: Never Used  . Alcohol Use: No  . Drug Use: No  . Sexually Active: Not on file   Other Topics Concern  . Not on file   Social History  Narrative  . No narrative on file    Electrocardiogram:  NSR normal ECG rate 71 05/05/12  Assessment and Plan

## 2012-06-09 NOTE — Assessment & Plan Note (Signed)
From history pre test prop still not high Favor cardiac CT over invasive cath.  Will try to get aproved

## 2012-06-14 ENCOUNTER — Telehealth: Payer: Self-pay | Admitting: *Deleted

## 2012-06-14 NOTE — Telephone Encounter (Signed)
         Wendall Stade, MD - FW: Cardiac CTA ','<More Detail >>       FW: Cardiac CTA  Yves Dill, MD  MRN: [id number] DOB: 07-09-1950     Pt Home: [phone]     Sent: Wed Jun 14, 2012 10:11 AM     To: Alois Cliche, LPN                          Message    Talk to patient about cath   ----- Message -----   From: Carmelina Paddock   Sent: 06/12/2012 3:46 PM   To: Wendall Stade, MD   Subject: Cardiac CTA       Per nurse reviewer at [facility] patient is at high risk due to weight and lipid results. [employer] recommending cardiac cath.       [phone] option 2 for MD review    ID# [id number]   Neysa Arts April 12, 2048      ThanksMorrie Sheldon                     Forwarded by:    Wendall Stade, MD Date: 06/14/2012         LMTCB .Zack Seal

## 2012-06-15 NOTE — Telephone Encounter (Signed)
PER PT WOULD RATHER HAVE CARDIAC CT   IF AT ALL POSSIBLE  WILL FORWARD TO DR Eden Emms  SO HE MAY CALL I NS CO ./CY

## 2012-06-21 NOTE — Telephone Encounter (Signed)
LMTCB ./CY 

## 2012-06-22 NOTE — Telephone Encounter (Signed)
lmtcb ./cy 

## 2012-06-27 NOTE — Telephone Encounter (Signed)
LMTCB ./CY 

## 2012-07-13 ENCOUNTER — Encounter: Payer: Self-pay | Admitting: Family Medicine

## 2012-07-13 ENCOUNTER — Ambulatory Visit (INDEPENDENT_AMBULATORY_CARE_PROVIDER_SITE_OTHER): Payer: BC Managed Care – PPO | Admitting: Family Medicine

## 2012-07-13 VITALS — BP 118/64 | HR 88 | Temp 98.1°F | Ht 67.0 in | Wt 185.0 lb

## 2012-07-13 DIAGNOSIS — I10 Essential (primary) hypertension: Secondary | ICD-10-CM

## 2012-07-13 DIAGNOSIS — J441 Chronic obstructive pulmonary disease with (acute) exacerbation: Secondary | ICD-10-CM

## 2012-07-13 DIAGNOSIS — F3289 Other specified depressive episodes: Secondary | ICD-10-CM

## 2012-07-13 DIAGNOSIS — R059 Cough, unspecified: Secondary | ICD-10-CM

## 2012-07-13 DIAGNOSIS — G629 Polyneuropathy, unspecified: Secondary | ICD-10-CM

## 2012-07-13 DIAGNOSIS — F329 Major depressive disorder, single episode, unspecified: Secondary | ICD-10-CM

## 2012-07-13 DIAGNOSIS — G609 Hereditary and idiopathic neuropathy, unspecified: Secondary | ICD-10-CM

## 2012-07-13 DIAGNOSIS — I7789 Other specified disorders of arteries and arterioles: Secondary | ICD-10-CM

## 2012-07-13 DIAGNOSIS — R05 Cough: Secondary | ICD-10-CM

## 2012-07-13 DIAGNOSIS — M0520 Rheumatoid vasculitis with rheumatoid arthritis of unspecified site: Secondary | ICD-10-CM

## 2012-07-13 DIAGNOSIS — F17200 Nicotine dependence, unspecified, uncomplicated: Secondary | ICD-10-CM

## 2012-07-13 DIAGNOSIS — R413 Other amnesia: Secondary | ICD-10-CM

## 2012-07-13 DIAGNOSIS — E785 Hyperlipidemia, unspecified: Secondary | ICD-10-CM

## 2012-07-13 DIAGNOSIS — Z0000 Encounter for general adult medical examination without abnormal findings: Secondary | ICD-10-CM

## 2012-07-13 DIAGNOSIS — E039 Hypothyroidism, unspecified: Secondary | ICD-10-CM

## 2012-07-13 LAB — BASIC METABOLIC PANEL
CO2: 24 mEq/L (ref 19–32)
Calcium: 10.2 mg/dL (ref 8.4–10.5)
GFR: 72.57 mL/min (ref >60.00)
Potassium: 3.8 mEq/L (ref 3.5–5.1)
Sodium: 138 mEq/L (ref 135–145)

## 2012-07-13 LAB — CBC WITH DIFFERENTIAL/PLATELET
Basophils Relative: 0.8 % (ref 0.0–3.0)
HCT: 39 % (ref 36.0–46.0)
Hemoglobin: 12.8 g/dL (ref 12.0–15.0)
Lymphocytes Relative: 37.3 % (ref 12.0–46.0)
Lymphs Abs: 2.6 10*3/uL (ref 0.7–4.0)
Monocytes Relative: 4.9 % (ref 3.0–12.0)
Neutro Abs: 3.8 10*3/uL (ref 1.4–7.7)
RBC: 4.25 Mil/uL (ref 3.87–5.11)

## 2012-07-13 LAB — HEPATIC FUNCTION PANEL
ALT: 27 U/L (ref 0–35)
AST: 18 U/L (ref 0–37)
Albumin: 4.1 g/dL (ref 3.5–5.2)
Alkaline Phosphatase: 38 U/L — ABNORMAL LOW (ref 39–117)
Total Protein: 7.2 g/dL (ref 6.0–8.3)

## 2012-07-13 LAB — MICROALBUMIN / CREATININE URINE RATIO: Microalb, Ur: 0.8 mg/dL (ref 0.0–1.9)

## 2012-07-13 LAB — LDL CHOLESTEROL, DIRECT: Direct LDL: 144.9 mg/dL

## 2012-07-13 MED ORDER — ALBUTEROL SULFATE HFA 108 (90 BASE) MCG/ACT IN AERS: 2.0000 | INHALATION_SPRAY | Freq: Four times a day (QID) | RESPIRATORY_TRACT | Status: DC | PRN

## 2012-07-13 MED ORDER — HYDROCODONE-ACETAMINOPHEN 5-500 MG PO TABS: 1.0000 | ORAL_TABLET | Freq: Four times a day (QID) | ORAL | Status: DC | PRN

## 2012-07-13 MED ORDER — VENLAFAXINE HCL ER 150 MG PO CP24: 150.0000 mg | ORAL_CAPSULE | Freq: Every day | ORAL | Status: DC

## 2012-07-13 NOTE — Progress Notes (Signed)
Subjective:     Samantha Hanson is a 62 y.o. female and is here for a comprehensive physical exam. The patient reports no problems.  History   Social History  . Marital Status: Married    Spouse Name: N/A    Number of Children: N/A  . Years of Education: N/A   Occupational History  . Not on file.   Social History Main Topics  . Smoking status: Current Every Day Smoker  . Smokeless tobacco: Never Used  . Alcohol Use: No  . Drug Use: No  . Sexually Active: Not on file   Other Topics Concern  . Not on file   Social History Narrative  . No narrative on file   Health Maintenance  Topic Date Due  . Colonoscopy  08/07/1998  . Mammogram  03/05/2012  . Influenza Vaccine  10/09/2012  . Pap Smear  07/13/2014  . Tetanus/tdap  08/20/2014  . Zostavax  Completed    The following portions of the patient's history were reviewed and updated as appropriate:  She  has a past medical history of Hyperlipidemia; Thyroid disease; MVP (mitral valve prolapse); and Migraine. She  does not have any pertinent problems on file. She  has past surgical history that includes Bunionectomy. Her family history includes Arthritis in her father; Coronary artery disease in her father; Heart disease in her father; Osteoporosis in her sister; and Thyroid cancer in her mother. She  reports that she has been smoking.  She has never used smokeless tobacco. She reports that she does not drink alcohol or use illicit drugs. She has a current medication list which includes the following prescription(s): albuterol, calcium carbonate, vitamin d, cyanocobalamin, cyclobenzaprine, fish oil-omega-3 fatty acids, glucosamine-chondroitin, hydrocodone-acetaminophen, lansoprazole, levothyroxine, losartan-hydrochlorothiazide, metoprolol tartrate, multiple vitamins-minerals, prednisone, and venlafaxine xr. Current Outpatient Prescriptions on File Prior to Visit  Medication Sig Dispense Refill  . calcium carbonate 1250 MG  capsule Take 1,250 mg by mouth daily.      . Cholecalciferol (VITAMIN D) 2000 UNITS CAPS Take by mouth.      . Cyanocobalamin (VITAMIN B-12 PO) Take 1 tablet by mouth daily.      . cyclobenzaprine (FLEXERIL) 10 MG tablet Take 10 mg by mouth daily as needed.      . fish oil-omega-3 fatty acids 1000 MG capsule Take 1 g by mouth daily.       [name] glucosamine-chondroitin 500-400 MG tablet Take 1 tablet by mouth 3 (three) times daily.      . lansoprazole (PREVACID) 30 MG capsule Take 30 mg by mouth daily as needed.      [name] levothyroxine (SYNTHROID, LEVOTHROID) 125 MCG tablet 1 tab by mouth daily---repeat labs are due now  90 tablet  0  . losartan-hydrochlorothiazide (HYZAAR) 100-12.5 MG per tablet Take 1 tablet by mouth daily.  30 tablet  11  . metoprolol tartrate (LOPRESSOR) 25 MG tablet Take 1 tablet (25 mg total) by mouth 2 (two) times daily.  180 tablet  3  . Multiple Vitamins-Minerals (MULTIVITAMIN PO) Take 1 tablet by mouth daily.      . predniSONE (DELTASONE) 10 MG tablet 3 po qd for 3 days then 2 po qd for 3 days the 1 po qd for 3 days  18 tablet  0   No current facility-administered medications on file prior to visit.   She has No Known Allergies..  Review of Systems Review of Systems  Constitutional: Negative for activity change, appetite change and fatigue.  HENT: Negative for hearing  loss, congestion, tinnitus and ear discharge.  dentist q44m Eyes: Negative for visual disturbance (see optho q1y -- vision corrected to 20/20 with glasses).  Respiratory: Negative for cough, chest tightness and shortness of breath.   Cardiovascular: Negative for chest pain, palpitations and leg swelling.  Gastrointestinal: Negative for abdominal pain, diarrhea, constipation and abdominal distention.  Genitourinary: Negative for urgency, frequency, decreased urine volume and difficulty urinating.  Musculoskeletal: Negative for back pain, arthralgias and gait problem.  Skin: Negative for color change, pallor  and rash.  Neurological: Negative for dizziness, light-headedness, numbness and headaches.  Hematological: Negative for adenopathy. Does not bruise/bleed easily.  Psychiatric/Behavioral: Negative for suicidal ideas, confusion, sleep disturbance, self-injury, dysphoric mood, decreased concentration and agitation.       Objective:    BP 118/64  Pulse 88  Temp(Src) 98.1 F (36.7 C) (Oral)  Ht 5\' 7"  (1.702 m)  Wt 185 lb (83.915 kg)  BMI 28.97 kg/m2  SpO2 95% General appearance: alert, cooperative, appears stated age and no distress Head: Normocephalic, without obvious abnormality, atraumatic Eyes: conjunctivae/corneas clear. PERRL, EOM's intact. Fundi benign. Ears: normal TM's and external ear canals both ears Nose: Nares normal. Septum midline. Mucosa normal. No drainage or sinus tenderness. Throat: lips, mucosa, and tongue normal; teeth and gums normal Neck: no adenopathy, no carotid bruit, no JVD, supple, symmetrical, trachea midline and thyroid not enlarged, symmetric, no tenderness/mass/nodules Back: symmetric, no curvature. ROM normal. No CVA tenderness. Lungs: clear to auscultation bilaterally Breasts: normal appearance, no masses or tenderness Heart: regular rate and rhythm, S1, S2 normal, no murmur, click, rub or gallop Abdomen: soft, non-tender; bowel sounds normal; no masses,  no organomegaly Pelvic: not indicated; post-menopausal, no abnormal Pap smears in past Extremities: extremities normal, atraumatic, no cyanosis or edema Pulses: 2+ and symmetric Skin: Skin color, texture, turgor normal. No rashes or lesions Lymph nodes: Cervical, supraclavicular, and axillary nodes normal. Neurologic: Sensory: normal, numbness and tingling in feet Psych- no depression, no anxiey      Assessment:    Healthy female exam.      Plan:    ghm utd  Check labs See After Visit Summary for Counseling Recommendations

## 2012-07-13 NOTE — Assessment & Plan Note (Signed)
Pt not ready to quit

## 2012-07-13 NOTE — Assessment & Plan Note (Signed)
Stable con't meds 

## 2012-07-13 NOTE — Assessment & Plan Note (Signed)
Check labs 

## 2012-07-13 NOTE — Assessment & Plan Note (Signed)
Neuro referral

## 2012-07-13 NOTE — Assessment & Plan Note (Signed)
lyrica 50 mg -- 1 po qhs for 1 week then she may inc to bid and inc to tid prn Refer to neuro

## 2012-07-13 NOTE — Patient Instructions (Addendum)
Preventive Care for Adults, Female A healthy lifestyle and preventive care can promote health and wellness. Preventive health guidelines for women include the following key practices.  A routine yearly physical is a good way to check with your caregiver about your health and preventive screening. It is a chance to share any concerns and updates on your health, and to receive a thorough exam.  Visit your dentist for a routine exam and preventive care every 6 months. Brush your teeth twice a day and floss once a day. Good oral hygiene prevents tooth decay and gum disease.  The frequency of eye exams is based on your age, health, family medical history, use of contact lenses, and other factors. Follow your caregiver's recommendations for frequency of eye exams.  Eat a healthy diet. Foods like vegetables, fruits, whole grains, low-fat dairy products, and lean protein foods contain the nutrients you need without too many calories. Decrease your intake of foods high in solid fats, added sugars, and salt. Eat the right amount of calories for you.Get information about a proper diet from your caregiver, if necessary.  Regular physical exercise is one of the most important things you can do for your health. Most adults should get at least 150 minutes of moderate-intensity exercise (any activity that increases your heart rate and causes you to sweat) each week. In addition, most adults need muscle-strengthening exercises on 2 or more days a week.  Maintain a healthy weight. The body mass index (BMI) is a screening tool to identify possible weight problems. It provides an estimate of body fat based on height and weight. Your caregiver can help determine your BMI, and can help you achieve or maintain a healthy weight.For adults 20 years and older:  A BMI below 18.5 is considered underweight.  A BMI of 18.5 to 24.9 is normal.  A BMI of 25 to 29.9 is considered overweight.  A BMI of 30 and above is  considered obese.  Maintain normal blood lipids and cholesterol levels by exercising and minimizing your intake of saturated fat. Eat a balanced diet with plenty of fruit and vegetables. Blood tests for lipids and cholesterol should begin at age 20 and be repeated every 5 years. If your lipid or cholesterol levels are high, you are over 50, or you are at high risk for heart disease, you may need your cholesterol levels checked more frequently.Ongoing high lipid and cholesterol levels should be treated with medicines if diet and exercise are not effective.  If you smoke, find out from your caregiver how to quit. If you do not use tobacco, do not start.  If you are pregnant, do not drink alcohol. If you are breastfeeding, be very cautious about drinking alcohol. If you are not pregnant and choose to drink alcohol, do not exceed 1 drink per day. One drink is considered to be 12 ounces (355 mL) of beer, 5 ounces (148 mL) of wine, or 1.5 ounces (44 mL) of liquor.  Avoid use of street drugs. Do not share needles with anyone. Ask for help if you need support or instructions about stopping the use of drugs.  High blood pressure causes heart disease and increases the risk of stroke. Your blood pressure should be checked at least every 1 to 2 years. Ongoing high blood pressure should be treated with medicines if weight loss and exercise are not effective.  If you are 55 to 64 years old, ask your caregiver if you should take aspirin to prevent strokes.  Diabetes   screening involves taking a blood sample to check your fasting blood sugar level. This should be done once every 3 years, after age 45, if you are within normal weight and without risk factors for diabetes. Testing should be considered at a younger age or be carried out more frequently if you are overweight and have at least 1 risk factor for diabetes.  Breast cancer screening is essential preventive care for women. You should practice "breast  self-awareness." This means understanding the normal appearance and feel of your breasts and may include breast self-examination. Any changes detected, no matter how small, should be reported to a caregiver. Women in their 20s and 30s should have a clinical breast exam (CBE) by a caregiver as part of a regular health exam every 1 to 3 years. After age 40, women should have a CBE every year. Starting at age 40, women should consider having a mammography (breast X-ray test) every year. Women who have a family history of breast cancer should talk to their caregiver about genetic screening. Women at a high risk of breast cancer should talk to their caregivers about having magnetic resonance imaging (MRI) and a mammography every year.  The Pap test is a screening test for cervical cancer. A Pap test can show cell changes on the cervix that might become cervical cancer if left untreated. A Pap test is a procedure in which cells are obtained and examined from the lower end of the uterus (cervix).  Women should have a Pap test starting at age 21.  Between ages 21 and 29, Pap tests should be repeated every 2 years.  Beginning at age 30, you should have a Pap test every 3 years as long as the past 3 Pap tests have been normal.  Some women have medical problems that increase the chance of getting cervical cancer. Talk to your caregiver about these problems. It is especially important to talk to your caregiver if a new problem develops soon after your last Pap test. In these cases, your caregiver may recommend more frequent screening and Pap tests.  The above recommendations are the same for women who have or have not gotten the vaccine for human papillomavirus (HPV).  If you had a hysterectomy for a problem that was not cancer or a condition that could lead to cancer, then you no longer need Pap tests. Even if you no longer need a Pap test, a regular exam is a good idea to make sure no other problems are  starting.  If you are between ages 65 and 70, and you have had normal Pap tests going back 10 years, you no longer need Pap tests. Even if you no longer need a Pap test, a regular exam is a good idea to make sure no other problems are starting.  If you have had past treatment for cervical cancer or a condition that could lead to cancer, you need Pap tests and screening for cancer for at least 20 years after your treatment.  If Pap tests have been discontinued, risk factors (such as a new sexual partner) need to be reassessed to determine if screening should be resumed.  The HPV test is an additional test that may be used for cervical cancer screening. The HPV test looks for the virus that can cause the cell changes on the cervix. The cells collected during the Pap test can be tested for HPV. The HPV test could be used to screen women aged 30 years and older, and should   be used in women of any age who have unclear Pap test results. After the age of 30, women should have HPV testing at the same frequency as a Pap test.  Colorectal cancer can be detected and often prevented. Most routine colorectal cancer screening begins at the age of 50 and continues through age 75. However, your caregiver may recommend screening at an earlier age if you have risk factors for colon cancer. On a yearly basis, your caregiver may provide home test kits to check for hidden blood in the stool. Use of a small camera at the end of a tube, to directly examine the colon (sigmoidoscopy or colonoscopy), can detect the earliest forms of colorectal cancer. Talk to your caregiver about this at age 50, when routine screening begins. Direct examination of the colon should be repeated every 5 to 10 years through age 75, unless early forms of pre-cancerous polyps or small growths are found.  Hepatitis C blood testing is recommended for all people born from 1945 through 1965 and any individual with known risks for hepatitis C.  Practice  safe sex. Use condoms and avoid high-risk sexual practices to reduce the spread of sexually transmitted infections (STIs). STIs include gonorrhea, chlamydia, syphilis, trichomonas, herpes, HPV, and human immunodeficiency virus (HIV). Herpes, HIV, and HPV are viral illnesses that have no cure. They can result in disability, cancer, and death. Sexually active women aged 25 and younger should be checked for chlamydia. Older women with new or multiple partners should also be tested for chlamydia. Testing for other STIs is recommended if you are sexually active and at increased risk.  Osteoporosis is a disease in which the bones lose minerals and strength with aging. This can result in serious bone fractures. The risk of osteoporosis can be identified using a bone density scan. Women ages 65 and over and women at risk for fractures or osteoporosis should discuss screening with their caregivers. Ask your caregiver whether you should take a calcium supplement or vitamin D to reduce the rate of osteoporosis.  Menopause can be associated with physical symptoms and risks. Hormone replacement therapy is available to decrease symptoms and risks. You should talk to your caregiver about whether hormone replacement therapy is right for you.  Use sunscreen with sun protection factor (SPF) of 30 or more. Apply sunscreen liberally and repeatedly throughout the day. You should seek shade when your shadow is shorter than you. Protect yourself by wearing long sleeves, pants, a wide-brimmed hat, and sunglasses year round, whenever you are outdoors.  Once a month, do a whole body skin exam, using a mirror to look at the skin on your back. Notify your caregiver of new moles, moles that have irregular borders, moles that are larger than a pencil eraser, or moles that have changed in shape or color.  Stay current with required immunizations.  Influenza. You need a dose every fall (or winter). The composition of the flu vaccine  changes each year, so being vaccinated once is not enough.  Pneumococcal polysaccharide. You need 1 to 2 doses if you smoke cigarettes or if you have certain chronic medical conditions. You need 1 dose at age 65 (or older) if you have never been vaccinated.  Tetanus, diphtheria, pertussis (Tdap, Td). Get 1 dose of Tdap vaccine if you are younger than age 65, are over 65 and have contact with an infant, are a healthcare worker, are pregnant, or simply want to be protected from whooping cough. After that, you need a Td   booster dose every 10 years. Consult your caregiver if you have not had at least 3 tetanus and diphtheria-containing shots sometime in your life or have a deep or dirty wound.  HPV. You need this vaccine if you are a woman age 26 or younger. The vaccine is given in 3 doses over 6 months.  Measles, mumps, rubella (MMR). You need at least 1 dose of MMR if you were born in 1957 or later. You may also need a second dose.  Meningococcal. If you are age 19 to 21 and a first-year college student living in a residence hall, or have one of several medical conditions, you need to get vaccinated against meningococcal disease. You may also need additional booster doses.  Zoster (shingles). If you are age 60 or older, you should get this vaccine.  Varicella (chickenpox). If you have never had chickenpox or you were vaccinated but received only 1 dose, talk to your caregiver to find out if you need this vaccine.  Hepatitis A. You need this vaccine if you have a specific risk factor for hepatitis A virus infection or you simply wish to be protected from this disease. The vaccine is usually given as 2 doses, 6 to 18 months apart.  Hepatitis B. You need this vaccine if you have a specific risk factor for hepatitis B virus infection or you simply wish to be protected from this disease. The vaccine is given in 3 doses, usually over 6 months. Preventive Services / Frequency Ages 19 to 39  Blood  pressure check.** / Every 1 to 2 years.  Lipid and cholesterol check.** / Every 5 years beginning at age 20.  Clinical breast exam.** / Every 3 years for women in their 20s and 30s.  Pap test.** / Every 2 years from ages 21 through 29. Every 3 years starting at age 30 through age 65 or 70 with a history of 3 consecutive normal Pap tests.  HPV screening.** / Every 3 years from ages 30 through ages 65 to 70 with a history of 3 consecutive normal Pap tests.  Hepatitis C blood test.** / For any individual with known risks for hepatitis C.  Skin self-exam. / Monthly.  Influenza immunization.** / Every year.  Pneumococcal polysaccharide immunization.** / 1 to 2 doses if you smoke cigarettes or if you have certain chronic medical conditions.  Tetanus, diphtheria, pertussis (Tdap, Td) immunization. / A one-time dose of Tdap vaccine. After that, you need a Td booster dose every 10 years.  HPV immunization. / 3 doses over 6 months, if you are 26 and younger.  Measles, mumps, rubella (MMR) immunization. / You need at least 1 dose of MMR if you were born in 1957 or later. You may also need a second dose.  Meningococcal immunization. / 1 dose if you are age 19 to 21 and a first-year college student living in a residence hall, or have one of several medical conditions, you need to get vaccinated against meningococcal disease. You may also need additional booster doses.  Varicella immunization.** / Consult your caregiver.  Hepatitis A immunization.** / Consult your caregiver. 2 doses, 6 to 18 months apart.  Hepatitis B immunization.** / Consult your caregiver. 3 doses usually over 6 months. Ages 40 to 64  Blood pressure check.** / Every 1 to 2 years.  Lipid and cholesterol check.** / Every 5 years beginning at age 20.  Clinical breast exam.** / Every year after age 40.  Mammogram.** / Every year beginning at age 40   and continuing for as long as you are in good health. Consult with your  caregiver.  Pap test.** / Every 3 years starting at age 30 through age 65 or 70 with a history of 3 consecutive normal Pap tests.  HPV screening.** / Every 3 years from ages 30 through ages 65 to 70 with a history of 3 consecutive normal Pap tests.  Fecal occult blood test (FOBT) of stool. / Every year beginning at age 50 and continuing until age 75. You may not need to do this test if you get a colonoscopy every 10 years.  Flexible sigmoidoscopy or colonoscopy.** / Every 5 years for a flexible sigmoidoscopy or every 10 years for a colonoscopy beginning at age 50 and continuing until age 75.  Hepatitis C blood test.** / For all people born from 1945 through 1965 and any individual with known risks for hepatitis C.  Skin self-exam. / Monthly.  Influenza immunization.** / Every year.  Pneumococcal polysaccharide immunization.** / 1 to 2 doses if you smoke cigarettes or if you have certain chronic medical conditions.  Tetanus, diphtheria, pertussis (Tdap, Td) immunization.** / A one-time dose of Tdap vaccine. After that, you need a Td booster dose every 10 years.  Measles, mumps, rubella (MMR) immunization. / You need at least 1 dose of MMR if you were born in 1957 or later. You may also need a second dose.  Varicella immunization.** / Consult your caregiver.  Meningococcal immunization.** / Consult your caregiver.  Hepatitis A immunization.** / Consult your caregiver. 2 doses, 6 to 18 months apart.  Hepatitis B immunization.** / Consult your caregiver. 3 doses, usually over 6 months. Ages 65 and over  Blood pressure check.** / Every 1 to 2 years.  Lipid and cholesterol check.** / Every 5 years beginning at age 20.  Clinical breast exam.** / Every year after age 40.  Mammogram.** / Every year beginning at age 40 and continuing for as long as you are in good health. Consult with your caregiver.  Pap test.** / Every 3 years starting at age 30 through age 65 or 70 with a 3  consecutive normal Pap tests. Testing can be stopped between 65 and 70 with 3 consecutive normal Pap tests and no abnormal Pap or HPV tests in the past 10 years.  HPV screening.** / Every 3 years from ages 30 through ages 65 or 70 with a history of 3 consecutive normal Pap tests. Testing can be stopped between 65 and 70 with 3 consecutive normal Pap tests and no abnormal Pap or HPV tests in the past 10 years.  Fecal occult blood test (FOBT) of stool. / Every year beginning at age 50 and continuing until age 75. You may not need to do this test if you get a colonoscopy every 10 years.  Flexible sigmoidoscopy or colonoscopy.** / Every 5 years for a flexible sigmoidoscopy or every 10 years for a colonoscopy beginning at age 50 and continuing until age 75.  Hepatitis C blood test.** / For all people born from 1945 through 1965 and any individual with known risks for hepatitis C.  Osteoporosis screening.** / A one-time screening for women ages 65 and over and women at risk for fractures or osteoporosis.  Skin self-exam. / Monthly.  Influenza immunization.** / Every year.  Pneumococcal polysaccharide immunization.** / 1 dose at age 65 (or older) if you have never been vaccinated.  Tetanus, diphtheria, pertussis (Tdap, Td) immunization. / A one-time dose of Tdap vaccine if you are over   65 and have contact with an infant, are a healthcare worker, or simply want to be protected from whooping cough. After that, you need a Td booster dose every 10 years.  Varicella immunization.** / Consult your caregiver.  Meningococcal immunization.** / Consult your caregiver.  Hepatitis A immunization.** / Consult your caregiver. 2 doses, 6 to 18 months apart.  Hepatitis B immunization.** / Check with your caregiver. 3 doses, usually over 6 months. ** Family history and personal history of risk and conditions may change your caregiver's recommendations. Document Released: 03/23/2001 Document Revised: 04/19/2011  Document Reviewed: 06/22/2010 [employer] Patient Information 2014 [employer]  

## 2012-07-14 LAB — POCT URINALYSIS DIPSTICK
Bilirubin, UA: NEGATIVE
Blood, UA: NEGATIVE
Ketones, UA: NEGATIVE
Leukocytes, UA: NEGATIVE
Spec Grav, UA: >=1.03
pH, UA: 6

## 2012-07-17 DIAGNOSIS — E782 Mixed hyperlipidemia: Secondary | ICD-10-CM

## 2012-07-18 ENCOUNTER — Encounter: Payer: Self-pay | Admitting: General Practice

## 2012-07-18 ENCOUNTER — Other Ambulatory Visit: Payer: Self-pay | Admitting: General Practice

## 2012-07-18 MED ORDER — LEVOTHYROXINE SODIUM 125 MCG PO TABS: ORAL_TABLET | ORAL | Status: DC

## 2012-07-24 ENCOUNTER — Ambulatory Visit: Payer: BC Managed Care – PPO | Admitting: Pharmacist

## 2012-07-25 ENCOUNTER — Encounter: Payer: Self-pay | Admitting: *Deleted

## 2012-07-25 NOTE — Telephone Encounter (Signed)
LEFT MESSAGE WITH PT'S HUSBAND  RE THE NEED FOR PT TO HAVE CATH  PER PT'S HUSBAND   MAIL  PT A NOTE RE THE NEED FOR HEART CATHERIZATION .Samantha Hanson

## 2012-07-31 ENCOUNTER — Encounter: Payer: Self-pay | Admitting: Family Medicine

## 2012-08-14 ENCOUNTER — Telehealth: Payer: Self-pay | Admitting: Family Medicine

## 2012-08-14 NOTE — Telephone Encounter (Signed)
In reference to Neurology referral entered 07/13/12, as of today, 08/14/12, patient has not been scheduled.  [facility] attempted to reach patient 3 times and left messages, as have I.  I also mailed patient a letter.  Patient will not respond.

## 2012-08-14 NOTE — Telephone Encounter (Signed)
noted 

## 2012-10-19 ENCOUNTER — Other Ambulatory Visit: Payer: Self-pay | Admitting: *Deleted

## 2012-10-19 ENCOUNTER — Other Ambulatory Visit: Payer: Self-pay | Admitting: General Practice

## 2012-10-19 DIAGNOSIS — I10 Essential (primary) hypertension: Secondary | ICD-10-CM

## 2012-10-19 MED ORDER — METOPROLOL TARTRATE 25 MG PO TABS: 25.0000 mg | ORAL_TABLET | Freq: Two times a day (BID) | ORAL | Status: DC

## 2012-11-07 ENCOUNTER — Encounter (INDEPENDENT_AMBULATORY_CARE_PROVIDER_SITE_OTHER): Payer: Self-pay

## 2012-11-13 ENCOUNTER — Other Ambulatory Visit: Payer: Self-pay | Admitting: Family Medicine

## 2012-12-06 ENCOUNTER — Ambulatory Visit (INDEPENDENT_AMBULATORY_CARE_PROVIDER_SITE_OTHER): Payer: BC Managed Care – PPO

## 2012-12-06 DIAGNOSIS — Z23 Encounter for immunization: Secondary | ICD-10-CM

## 2013-02-02 ENCOUNTER — Other Ambulatory Visit: Payer: Self-pay | Admitting: General Practice

## 2013-02-02 MED ORDER — LANSOPRAZOLE 30 MG PO CPDR: 30.0000 mg | DELAYED_RELEASE_CAPSULE | Freq: Every day | ORAL | Status: DC | PRN

## 2013-02-02 MED ORDER — LEVOTHYROXINE SODIUM 125 MCG PO TABS: ORAL_TABLET | ORAL | Status: DC

## 2013-03-19 ENCOUNTER — Telehealth: Payer: Self-pay | Admitting: *Deleted

## 2013-03-19 ENCOUNTER — Other Ambulatory Visit: Payer: Self-pay | Admitting: *Deleted

## 2013-03-19 DIAGNOSIS — I10 Essential (primary) hypertension: Secondary | ICD-10-CM

## 2013-03-19 MED ORDER — VENLAFAXINE HCL ER 150 MG PO CP24: ORAL_CAPSULE | ORAL | Status: DC

## 2013-03-19 MED ORDER — METOPROLOL TARTRATE 25 MG PO TABS: 25.0000 mg | ORAL_TABLET | Freq: Two times a day (BID) | ORAL | Status: DC

## 2013-03-19 NOTE — Telephone Encounter (Signed)
Patient and called and requested a refill for metoprolol tartrate (LOPRESSOR) 25 MG tablet and venlafaxine XR (EFFEXOR-XR) 150 MG 24 hr capsule    Pharmacy [facility] ([place]) [place]

## 2013-03-19 NOTE — Telephone Encounter (Signed)
Done, JG//CMA 

## 2013-03-20 ENCOUNTER — Other Ambulatory Visit: Payer: Self-pay | Admitting: Family Medicine

## 2013-03-20 NOTE — Telephone Encounter (Signed)
The request came from [facility], so that is where is was sent.     KP

## 2013-03-20 NOTE — Telephone Encounter (Signed)
Patient states her metoprolol should have been sent to [facility] not [facility]. She is now using [facility] exclusively.

## 2013-03-23 ENCOUNTER — Other Ambulatory Visit: Payer: Self-pay | Admitting: *Deleted

## 2013-03-23 DIAGNOSIS — I10 Essential (primary) hypertension: Secondary | ICD-10-CM

## 2013-03-23 MED ORDER — METOPROLOL TARTRATE 25 MG PO TABS: 25.0000 mg | ORAL_TABLET | Freq: Two times a day (BID) | ORAL | Status: DC

## 2013-03-23 MED ORDER — VENLAFAXINE HCL ER 150 MG PO CP24: ORAL_CAPSULE | ORAL | Status: DC

## 2013-04-19 ENCOUNTER — Telehealth: Payer: Self-pay

## 2013-04-19 ENCOUNTER — Other Ambulatory Visit: Payer: Self-pay | Admitting: Family Medicine

## 2013-04-19 MED ORDER — VENLAFAXINE HCL ER 150 MG PO CP24: ORAL_CAPSULE | ORAL | Status: DC

## 2013-04-19 NOTE — Telephone Encounter (Signed)
Ok.     KP

## 2013-04-19 NOTE — Telephone Encounter (Signed)
Call from the patient who is requesting a refill on her Effexor, I explained this medication is a maintenance medication and according to her last OV there was a dose change and she was supposed to had come back in 3 mos, she said she is not coming in, she does not want to come in and she will come in June for her physical. I advised the patient the standard and according to our office protocol she should come every 6 mos, Dr.Lowne requested she returned in 3 for a follow up. Advised in order to continue to receive med's she will need to come in, She declined and started to argue and I advised the patient to have a nice day.      KP

## 2013-04-19 NOTE — Telephone Encounter (Signed)
Samantha Hanson ----wanted you to be aware of this Samantha Hanson -- I sent 14 pills

## 2013-04-23 ENCOUNTER — Telehealth: Payer: Self-pay | Admitting: *Deleted

## 2013-04-23 NOTE — Telephone Encounter (Signed)
Spoke with patient who was very rude and short with me, stating that she feels like "it is stupid for me to have to come in and tell Dr. Etter Sjogren everything is fine with my medicine. When I felt like I needed more, I increase the dose but then it got too expensive so I cut back on the amount I was taking." I advised the patient her physician felt it was in the best interest of the patient to have them follow up in the office after a dosage change. I reminded the patient she was told at the end of her last office visit and it was printed on her patient instruction sheet as well to follow up in 3 months. Patient then began speaking loudly stating "this is stupid, I am not going to come see her to tell her everything is fine. Cancel my appointment tomorrow, I won't be there." Then patient abruptly hung up the phone.   This patient was rude to [employer] and myself. Based on the patient's non compliance with medical advice and her behavior with staff, I recommend dismissal from the practice.

## 2013-04-24 ENCOUNTER — Ambulatory Visit: Payer: BC Managed Care – PPO | Admitting: Family Medicine

## 2013-06-05 ENCOUNTER — Telehealth: Payer: Self-pay | Admitting: Family Medicine

## 2013-06-05 ENCOUNTER — Ambulatory Visit (INDEPENDENT_AMBULATORY_CARE_PROVIDER_SITE_OTHER)
Admission: RE | Admit: 2013-06-05 | Discharge: 2013-06-05 | Disposition: A | Discharge status: Home / Self Care | Payer: BC Managed Care – PPO | Source: Ambulatory Visit | Attending: Family Medicine | Admitting: Family Medicine

## 2013-06-05 ENCOUNTER — Ambulatory Visit (INDEPENDENT_AMBULATORY_CARE_PROVIDER_SITE_OTHER): Payer: BC Managed Care – PPO | Admitting: Family Medicine

## 2013-06-05 ENCOUNTER — Encounter: Payer: Self-pay | Admitting: Family Medicine

## 2013-06-05 VITALS — BP 110/68 | HR 68 | Temp 98.3°F | Wt 183.0 lb

## 2013-06-05 DIAGNOSIS — I10 Essential (primary) hypertension: Secondary | ICD-10-CM

## 2013-06-05 DIAGNOSIS — R059 Cough, unspecified: Secondary | ICD-10-CM

## 2013-06-05 DIAGNOSIS — R05 Cough: Secondary | ICD-10-CM

## 2013-06-05 DIAGNOSIS — M1990 Unspecified osteoarthritis, unspecified site: Secondary | ICD-10-CM

## 2013-06-05 DIAGNOSIS — E039 Hypothyroidism, unspecified: Secondary | ICD-10-CM

## 2013-06-05 DIAGNOSIS — F411 Generalized anxiety disorder: Secondary | ICD-10-CM

## 2013-06-05 DIAGNOSIS — J209 Acute bronchitis, unspecified: Secondary | ICD-10-CM

## 2013-06-05 LAB — BASIC METABOLIC PANEL
BUN: 18 mg/dL (ref 6–23)
CALCIUM: 10.1 mg/dL (ref 8.4–10.5)
CO2: 23 mEq/L (ref 19–32)
CREATININE: 1 mg/dL (ref 0.4–1.2)
Chloride: 102 mEq/L (ref 96–112)
GFR: 61.29 mL/min (ref >60.00)
GLUCOSE: 99 mg/dL (ref 70–99)
Potassium: 3.3 mEq/L — ABNORMAL LOW (ref 3.5–5.1)
Sodium: 137 mEq/L (ref 135–145)

## 2013-06-05 LAB — LIPID PANEL
CHOLESTEROL: 375 mg/dL — AB (ref 0–200)
HDL: 36.8 mg/dL — ABNORMAL LOW (ref >39.00)
LDL CALC: 262 mg/dL — AB (ref 0–99)
TRIGLYCERIDES: 383 mg/dL — AB (ref 0.0–149.0)
Total CHOL/HDL Ratio: 10
VLDL: 76.6 mg/dL — AB (ref 0.0–40.0)

## 2013-06-05 LAB — CBC WITH DIFFERENTIAL/PLATELET
BASOS ABS: 0 10*3/uL (ref 0.0–0.1)
Basophils Relative: 0.5 % (ref 0.0–3.0)
EOS PCT: 2.5 % (ref 0.0–5.0)
Eosinophils Absolute: 0.2 10*3/uL (ref 0.0–0.7)
HCT: 38.3 % (ref 36.0–46.0)
Hemoglobin: 12.6 g/dL (ref 12.0–15.0)
LYMPHS ABS: 2 10*3/uL (ref 0.7–4.0)
Lymphocytes Relative: 31 % (ref 12.0–46.0)
MCHC: 33 g/dL (ref 30.0–36.0)
MCV: 91.7 fl (ref 78.0–100.0)
MONOS PCT: 6.1 % (ref 3.0–12.0)
Monocytes Absolute: 0.4 10*3/uL (ref 0.1–1.0)
NEUTROS PCT: 59.9 % (ref 43.0–77.0)
Neutro Abs: 3.9 10*3/uL (ref 1.4–7.7)
PLATELETS: 277 10*3/uL (ref 150.0–400.0)
RBC: 4.18 Mil/uL (ref 3.87–5.11)
RDW: 15.3 % — AB (ref 11.5–14.6)
WBC: 6.4 10*3/uL (ref 4.5–10.5)

## 2013-06-05 LAB — HEPATIC FUNCTION PANEL
ALT: 24 U/L (ref 0–35)
AST: 19 U/L (ref 0–37)
Albumin: 4.1 g/dL (ref 3.5–5.2)
Alkaline Phosphatase: 41 U/L (ref 39–117)
BILIRUBIN DIRECT: 0.1 mg/dL (ref 0.0–0.3)
Total Bilirubin: 0.4 mg/dL (ref 0.3–1.2)
Total Protein: 7.4 g/dL (ref 6.0–8.3)

## 2013-06-05 LAB — TSH: TSH: 6.31 u[IU]/mL — ABNORMAL HIGH (ref 0.35–5.50)

## 2013-06-05 MED ORDER — HYDROCODONE-ACETAMINOPHEN 5-325 MG PO TABS: 1.0000 | ORAL_TABLET | Freq: Four times a day (QID) | ORAL | Status: DC | PRN

## 2013-06-05 MED ORDER — VENLAFAXINE HCL ER 150 MG PO CP24: 150.0000 mg | ORAL_CAPSULE | Freq: Every day | ORAL | Status: DC

## 2013-06-05 MED ORDER — AMOXICILLIN-POT CLAVULANATE 875-125 MG PO TABS: 1.0000 | ORAL_TABLET | Freq: Two times a day (BID) | ORAL | Status: DC

## 2013-06-05 NOTE — Progress Notes (Signed)
  Subjective:     Samantha Hanson is a 63 y.o. female here for evaluation of a cough. Onset of symptoms was 1 week ago. Symptoms have been gradually worsening since that time. The cough is productive and is aggravated by infection. Associated symptoms include: chills, fever, shortness of breath and sputum production. Patient does not have a history of asthma. Patient does not have a history of environmental allergens. Patient has not traveled recently. Patient does have a history of smoking. Patient has not had a previous chest x-ray. Patient has not had a PPD done.  The following portions of the patient's history were reviewed and updated as appropriate: allergies, current medications, past family history, past medical history, past social history, past surgical history and problem list.  Review of Systems Pertinent items are noted in HPI.    Objective:    Oxygen saturation 97% on room air BP 110/68  Pulse 68  Temp(Src) 98.3 F (36.8 C) (Oral)  Wt 183 lb (83.008 kg)  SpO2 97% General appearance: alert, cooperative, appears stated age and no distress Head: Normocephalic, without obvious abnormality, atraumatic Ears: normal TM's and external ear canals both ears Nose: green discharge, moderate congestion, sinus tenderness bilateral Throat: lips, mucosa, and tongue normal; teeth and gums normal Neck: moderate anterior cervical adenopathy, no JVD, supple, symmetrical, trachea midline and thyroid not enlarged, symmetric, no tenderness/mass/nodules Lungs: rhonchi bilaterally and wheezes bilaterally Heart: S1, S2 normal    Assessment:    Acute Bronchitis    Plan:    Antibiotics per medication orders. Call if shortness of breath worsens, blood in sputum, change in character of cough, development of fever or chills, inability to maintain nutrition and hydration. Avoid exposure to tobacco smoke and fumes. Chest x-ray.   1. HTN (hypertension) stable - Basic metabolic panel - CBC with  Differential  2. Unspecified hypothyroidism Check labs - Hepatic function panel - Lipid panel - TSH  3. Cough  - DG Chest 2 View; Future  4. Generalized anxiety disorder  - venlafaxine XR (EFFEXOR-XR) 150 MG 24 hr capsule; Take 1 capsule (150 mg total) by mouth daily with breakfast. \  Dispense: 30 capsule; Refill: 11  5. Acute bronchitis  - amoxicillin-clavulanate (AUGMENTIN) 875-125 MG per tablet; Take 1 tablet by mouth 2 (two) times daily.  Dispense: 20 tablet; Refill: 0

## 2013-06-05 NOTE — Patient Instructions (Signed)

## 2013-06-05 NOTE — Telephone Encounter (Addendum)
Normal xray   Patient has been made aware      KP

## 2013-06-05 NOTE — Progress Notes (Signed)
Pre visit review using our clinic review tool, if applicable. No additional management support is needed unless otherwise documented below in the visit note. 

## 2013-06-05 NOTE — Telephone Encounter (Signed)
Caller name: Naarah Relation to pt: Call back Alva:  Reason for call:  Pt called in wanting results from chest x-ray.  Informed pt that somebody would contact her as soon as they read the results.

## 2013-06-06 ENCOUNTER — Telehealth: Payer: Self-pay | Admitting: Family Medicine

## 2013-06-06 NOTE — Telephone Encounter (Signed)
Relevant patient education assigned to patient using Emmi. ° °

## 2013-06-18 ENCOUNTER — Telehealth: Payer: Self-pay | Admitting: Family Medicine

## 2013-06-18 ENCOUNTER — Telehealth: Payer: Self-pay

## 2013-06-18 MED ORDER — POTASSIUM CHLORIDE CRYS ER 20 MEQ PO TBCR: 20.0000 meq | EXTENDED_RELEASE_TABLET | Freq: Every day | ORAL | Status: DC

## 2013-06-18 MED ORDER — PRAVASTATIN SODIUM 20 MG PO TABS: 20.0000 mg | ORAL_TABLET | Freq: Every day | ORAL | Status: DC

## 2013-06-18 MED ORDER — MOXIFLOXACIN HCL 400 MG PO TABS: 400.0000 mg | ORAL_TABLET | Freq: Every day | ORAL | Status: DC

## 2013-06-18 NOTE — Telephone Encounter (Signed)
A user error has taken place.

## 2013-06-18 NOTE — Telephone Encounter (Signed)
Message copied by Ewing Schlein on Mon Jun 18, 2013  9:56 AM ------      Message from: Rosalita Chessman      Created: Wed Jun 06, 2013  7:26 PM       K is low--- KCL 20 meq #30 1 po qd , 2 refills      Cholesterol--- LDL goal < 100,  HDL >40,  TG < 150.  Diet and exercise will increase HDL and decrease LDL and TG.  Fish,  Fish Oil, Flaxseed oil will also help increase the HDL and decrease Triglycerides.   Recheck labs in 3 months----  Increased risk for heart attack and stroke      Start Lipitor 20 mg #30  1 po qhs, 2 refillls     272.4 lipid, hep       ------

## 2013-06-18 NOTE — Telephone Encounter (Signed)
pravachol 20 mg #30  1 po qhs , 2 refills        Take co Q 10 200 mg daily

## 2013-06-18 NOTE — Telephone Encounter (Signed)
Patient aware and voiced understanding. Rx sent      KP 

## 2013-06-18 NOTE — Telephone Encounter (Signed)
Rx sent to [facility] on [address].  Patient also advised to pick up Co Q 10 200 mg over the counter.  Stated understanding and agreed.    While on phone, patient shared that she had completed her antibx but continues to experience chest congestion.  Wants to know if she needs to take anything else.  Please advise.

## 2013-06-18 NOTE — Telephone Encounter (Signed)
Spoke with patient and she stated she can not take the Lipitor because it causes muscle weakness. She stated she has tried Crestor in the past another medication but she can not remember the name, she said she switched to the Lipitor because she was told it was a better drug however she can not take it. She said if we are going to prescribe something she would like for it to be generic. Please advise    KP

## 2013-06-18 NOTE — Telephone Encounter (Signed)
avelox 400 mg # 5 1 po qd  Ov if no better

## 2013-07-18 ENCOUNTER — Other Ambulatory Visit: Payer: Self-pay | Admitting: *Deleted

## 2013-07-18 MED ORDER — LOSARTAN POTASSIUM-HCTZ 100-12.5 MG PO TABS: 1.0000 | ORAL_TABLET | Freq: Every day | ORAL | Status: DC

## 2013-08-24 ENCOUNTER — Other Ambulatory Visit: Payer: Self-pay | Admitting: Family Medicine

## 2013-08-27 NOTE — Telephone Encounter (Signed)
Rx sent to the pharmacy by e-script.//AB/CMA 

## 2013-08-28 ENCOUNTER — Telehealth: Payer: Self-pay | Admitting: Family Medicine

## 2013-08-28 DIAGNOSIS — E079 Disorder of thyroid, unspecified: Secondary | ICD-10-CM

## 2013-08-28 NOTE — Telephone Encounter (Signed)
Caller name: Georgann Relation to pt: Call back [place]: [facility] on [address]  Reason for call:  Pt is needing the Rx levothyroxine (SYNTHROID, LEVOTHROID) 125 MCG tablet refilled and sent to her new pharmacy listed above

## 2013-08-29 NOTE — Telephone Encounter (Signed)
Spoke with patient and advised that she needs a thyroid check. Patient agreeable. Put on schedule and entered order.

## 2013-08-29 NOTE — Telephone Encounter (Signed)
Patient is calling again to check on the status below.Marland KitchenMarland KitchenMarland Kitchen

## 2013-08-30 ENCOUNTER — Other Ambulatory Visit (INDEPENDENT_AMBULATORY_CARE_PROVIDER_SITE_OTHER): Payer: Medicare Other

## 2013-08-30 DIAGNOSIS — E039 Hypothyroidism, unspecified: Secondary | ICD-10-CM | POA: Diagnosis not present

## 2013-08-30 LAB — TSH: TSH: 3.28 u[IU]/mL (ref 0.35–4.50)

## 2013-08-31 ENCOUNTER — Telehealth: Payer: Self-pay

## 2013-08-31 MED ORDER — LEVOTHYROXINE SODIUM 125 MCG PO TABS: ORAL_TABLET | ORAL | Status: DC

## 2013-08-31 NOTE — Telephone Encounter (Signed)
Message copied by Reino Bellis on Fri Aug 31, 2013  8:50 AM ------      Message from: Midge Minium      Created: Thu Aug 30, 2013  5:04 PM       TSH is normal- no change in medication.  [place] for refill ------

## 2013-12-04 ENCOUNTER — Telehealth: Payer: Self-pay | Admitting: Family Medicine

## 2013-12-04 DIAGNOSIS — M2550 Pain in unspecified joint: Secondary | ICD-10-CM

## 2013-12-04 MED ORDER — HYDROCODONE-ACETAMINOPHEN 5-325 MG PO TABS: 1.0000 | ORAL_TABLET | Freq: Four times a day (QID) | ORAL | Status: DC | PRN

## 2013-12-04 NOTE — Telephone Encounter (Signed)
Refill x1 

## 2013-12-04 NOTE — Telephone Encounter (Signed)
Patient Rx ready for pick up after 1 pm.     KP

## 2013-12-04 NOTE — Telephone Encounter (Signed)
Last seen and filled 06/05/13 #30 UDS 05/05/12 low risk   Please advise     KP

## 2013-12-04 NOTE — Telephone Encounter (Signed)
Caller name: Jaileigh, [place] Relation to pt: self Call back number: [phone] Pharmacy: [facility] [phone]   Reason for call: pt requesting a refill of HYDROcodone-acetaminophen (NORCO/VICODIN) 5-325 MG per tablet

## 2013-12-08 DIAGNOSIS — Z23 Encounter for immunization: Secondary | ICD-10-CM | POA: Diagnosis not present

## 2014-03-12 ENCOUNTER — Other Ambulatory Visit: Payer: Self-pay | Admitting: Family Medicine

## 2014-04-24 ENCOUNTER — Other Ambulatory Visit: Payer: Self-pay

## 2014-06-26 ENCOUNTER — Other Ambulatory Visit: Payer: Self-pay | Admitting: Family Medicine

## 2014-06-26 NOTE — Telephone Encounter (Signed)
Pt last seen by PCP 05/2013 and advised 6 month follow up. Pt is past due. 30 day supply of venlafaxine sent to pharmacy. Please call pt to arrange f/u before further refills are due.

## 2014-06-26 NOTE — Telephone Encounter (Signed)
Phone has been disconnected 

## 2014-06-26 NOTE — Telephone Encounter (Signed)
Letter mailed to pt.  

## 2014-07-16 ENCOUNTER — Telehealth: Payer: Self-pay | Admitting: Family Medicine

## 2014-07-16 MED ORDER — VENLAFAXINE HCL ER 150 MG PO CP24: ORAL_CAPSULE | ORAL | Status: DC

## 2014-07-16 MED ORDER — LOSARTAN POTASSIUM-HCTZ 100-12.5 MG PO TABS: 1.0000 | ORAL_TABLET | Freq: Every day | ORAL | Status: DC

## 2014-07-16 MED ORDER — METOPROLOL TARTRATE 25 MG PO TABS: ORAL_TABLET | ORAL | Status: DC

## 2014-07-16 NOTE — Telephone Encounter (Signed)
Relation to pt: self  Call back number: [phone] Pharmacy: [facility], [address] [phone] (Phone) [phone] (Fax)         Reason for call:  Pt requesting a refill to hold her over until next appointment 08/06/2014  venlafaxine XR (EFFEXOR-XR) 150 MG 24 hr capsule  losartan-hydrochlorothiazide (HYZAAR) 100-12.5 MG per tablet  metoprolol tartrate (LOPRESSOR) 25 MG tablet

## 2014-07-16 NOTE — Telephone Encounter (Signed)
Rx faxed.    KP 

## 2014-08-06 ENCOUNTER — Encounter: Payer: Self-pay | Admitting: Family Medicine

## 2014-08-06 ENCOUNTER — Ambulatory Visit (INDEPENDENT_AMBULATORY_CARE_PROVIDER_SITE_OTHER): Payer: Medicare Other | Admitting: Family Medicine

## 2014-08-06 VITALS — BP 122/78 | HR 97 | Temp 97.9°F | Resp 18 | Ht 66.0 in | Wt 191.2 lb

## 2014-08-06 DIAGNOSIS — Z23 Encounter for immunization: Secondary | ICD-10-CM

## 2014-08-06 DIAGNOSIS — M2550 Pain in unspecified joint: Secondary | ICD-10-CM

## 2014-08-06 DIAGNOSIS — Z1231 Encounter for screening mammogram for malignant neoplasm of breast: Secondary | ICD-10-CM | POA: Diagnosis not present

## 2014-08-06 DIAGNOSIS — Z1239 Encounter for other screening for malignant neoplasm of breast: Secondary | ICD-10-CM | POA: Diagnosis not present

## 2014-08-06 DIAGNOSIS — E785 Hyperlipidemia, unspecified: Secondary | ICD-10-CM

## 2014-08-06 DIAGNOSIS — R739 Hyperglycemia, unspecified: Secondary | ICD-10-CM

## 2014-08-06 DIAGNOSIS — J441 Chronic obstructive pulmonary disease with (acute) exacerbation: Secondary | ICD-10-CM

## 2014-08-06 DIAGNOSIS — F329 Major depressive disorder, single episode, unspecified: Secondary | ICD-10-CM

## 2014-08-06 DIAGNOSIS — E039 Hypothyroidism, unspecified: Secondary | ICD-10-CM

## 2014-08-06 DIAGNOSIS — I10 Essential (primary) hypertension: Secondary | ICD-10-CM | POA: Diagnosis not present

## 2014-08-06 DIAGNOSIS — G629 Polyneuropathy, unspecified: Secondary | ICD-10-CM

## 2014-08-06 DIAGNOSIS — K219 Gastro-esophageal reflux disease without esophagitis: Secondary | ICD-10-CM

## 2014-08-06 DIAGNOSIS — F32A Depression, unspecified: Secondary | ICD-10-CM

## 2014-08-06 LAB — CBC WITH DIFFERENTIAL/PLATELET
Basophils Absolute: 0 10*3/uL (ref 0.0–0.1)
Basophils Relative: 0.6 % (ref 0.0–3.0)
EOS ABS: 0.1 10*3/uL (ref 0.0–0.7)
Eosinophils Relative: 1.7 % (ref 0.0–5.0)
HCT: 40.7 % (ref 36.0–46.0)
HEMOGLOBIN: 13.4 g/dL (ref 12.0–15.0)
LYMPHS ABS: 1.6 10*3/uL (ref 0.7–4.0)
Lymphocytes Relative: 23.1 % (ref 12.0–46.0)
MCHC: 33 g/dL (ref 30.0–36.0)
MCV: 91.1 fl (ref 78.0–100.0)
Monocytes Absolute: 0.4 10*3/uL (ref 0.1–1.0)
Monocytes Relative: 5.9 % (ref 3.0–12.0)
NEUTROS ABS: 4.9 10*3/uL (ref 1.4–7.7)
NEUTROS PCT: 68.7 % (ref 43.0–77.0)
Platelets: 252 10*3/uL (ref 150.0–400.0)
RBC: 4.46 Mil/uL (ref 3.87–5.11)
RDW: 15.6 % — ABNORMAL HIGH (ref 11.5–15.5)
WBC: 7.1 10*3/uL (ref 4.0–10.5)

## 2014-08-06 LAB — HEPATIC FUNCTION PANEL
ALBUMIN: 4.4 g/dL (ref 3.5–5.2)
ALK PHOS: 47 U/L (ref 39–117)
ALT: 27 U/L (ref 0–35)
AST: 46 U/L — AB (ref 0–37)
Bilirubin, Direct: 0 mg/dL (ref 0.0–0.3)
Total Bilirubin: 0.3 mg/dL (ref 0.2–1.2)
Total Protein: 7.6 g/dL (ref 6.0–8.3)

## 2014-08-06 LAB — BASIC METABOLIC PANEL
BUN: 14 mg/dL (ref 6–23)
CALCIUM: 10 mg/dL (ref 8.4–10.5)
CO2: 26 mEq/L (ref 19–32)
Chloride: 101 mEq/L (ref 96–112)
Creatinine, Ser: 1.08 mg/dL (ref 0.40–1.20)
GFR: 53.95 mL/min — ABNORMAL LOW (ref >60.00)
Glucose, Bld: 115 mg/dL — ABNORMAL HIGH (ref 70–99)
Potassium: 4.3 mEq/L (ref 3.5–5.1)
SODIUM: 136 meq/L (ref 135–145)

## 2014-08-06 LAB — HEMOGLOBIN A1C: HEMOGLOBIN A1C: 6.2 % (ref 4.6–6.5)

## 2014-08-06 MED ORDER — METOPROLOL TARTRATE 25 MG PO TABS: ORAL_TABLET | ORAL | Status: DC

## 2014-08-06 MED ORDER — LOSARTAN POTASSIUM-HCTZ 100-12.5 MG PO TABS: 1.0000 | ORAL_TABLET | Freq: Every day | ORAL | Status: DC

## 2014-08-06 MED ORDER — LEVOTHYROXINE SODIUM 125 MCG PO TABS: 125.0000 ug | ORAL_TABLET | Freq: Every day | ORAL | Status: DC

## 2014-08-06 MED ORDER — GABAPENTIN 100 MG PO CAPS: ORAL_CAPSULE | ORAL | Status: DC

## 2014-08-06 MED ORDER — VENLAFAXINE HCL ER 150 MG PO CP24
ORAL_CAPSULE | ORAL | Status: DC
Start: 2014-08-06 — End: 2014-09-27

## 2014-08-06 MED ORDER — HYDROCODONE-ACETAMINOPHEN 5-325 MG PO TABS: 1.0000 | ORAL_TABLET | Freq: Four times a day (QID) | ORAL | Status: DC | PRN

## 2014-08-06 MED ORDER — POTASSIUM CHLORIDE CRYS ER 20 MEQ PO TBCR: 20.0000 meq | EXTENDED_RELEASE_TABLET | Freq: Every day | ORAL | Status: DC

## 2014-08-06 MED ORDER — PRAVASTATIN SODIUM 20 MG PO TABS: 20.0000 mg | ORAL_TABLET | Freq: Every day | ORAL | Status: DC

## 2014-08-06 MED ORDER — LANSOPRAZOLE 30 MG PO CPDR: 30.0000 mg | DELAYED_RELEASE_CAPSULE | Freq: Every day | ORAL | Status: AC | PRN

## 2014-08-06 MED ORDER — ALBUTEROL SULFATE HFA 108 (90 BASE) MCG/ACT IN AERS: 2.0000 | INHALATION_SPRAY | Freq: Four times a day (QID) | RESPIRATORY_TRACT | Status: DC | PRN

## 2014-08-06 NOTE — Progress Notes (Signed)
Patient ID: Samantha Hanson, female    DOB: 1950/07/04  Age: 64 y.o. MRN: [id number]    Subjective:  Subjective HPI Pessy Delamar presents for f/u htn, cholesterol and c/o burning in feet .   Review of Systems  Constitutional: Negative for diaphoresis, appetite change, fatigue and unexpected weight change.  Eyes: Negative for pain, redness and visual disturbance.  Respiratory: Negative for cough, chest tightness, shortness of breath and wheezing.   Cardiovascular: Negative for chest pain, palpitations and leg swelling.  Endocrine: Negative for cold intolerance, heat intolerance, polydipsia, polyphagia and polyuria.  Genitourinary: Negative for dysuria, frequency and difficulty urinating.  Neurological: Negative for dizziness, light-headedness, numbness and headaches.    History Past Medical History  Diagnosis Date  . Hyperlipidemia   . Thyroid disease   . MVP (mitral valve prolapse)   . Migraine     She has past surgical history that includes Bunionectomy.   Her family history includes Arthritis in her father; Coronary artery disease in her father; Heart disease in her father; Osteoporosis in her sister; Thyroid cancer in her mother.She reports that she has been smoking.  She has never used smokeless tobacco. She reports that she does not drink alcohol or use illicit drugs.  Current Outpatient Prescriptions on File Prior to Visit  Medication Sig Dispense Refill  . calcium carbonate 1250 MG capsule Take 1,250 mg by mouth daily.    . Cholecalciferol (VITAMIN D) 2000 UNITS CAPS Take by mouth.    . Cyanocobalamin (VITAMIN B-12 PO) Take 1 tablet by mouth daily.    . cyclobenzaprine (FLEXERIL) 10 MG tablet Take 10 mg by mouth daily as needed.    . fish oil-omega-3 fatty acids 1000 MG capsule Take 1 g by mouth daily.     [name] glucosamine-chondroitin 500-400 MG tablet Take 1 tablet by mouth 3 (three) times daily.    . Multiple Vitamins-Minerals (MULTIVITAMIN PO) Take 1 tablet by mouth  daily.     No current facility-administered medications on file prior to visit.     Objective:  Objective Physical Exam  Constitutional: She is oriented to person, place, and time. She appears well-developed and well-nourished.  HENT:  Head: Normocephalic and atraumatic.  Eyes: Conjunctivae and EOM are normal.  Neck: Normal range of motion. Neck supple. No JVD present. Carotid bruit is not present. No thyromegaly present.  Cardiovascular: Normal rate, regular rhythm and normal heart sounds.   No murmur heard. Pulmonary/Chest: Effort normal and breath sounds normal. No respiratory distress. She has no wheezes. She has no rales. She exhibits no tenderness.  Musculoskeletal: She exhibits no edema.  Neurological: She is alert and oriented to person, place, and time.  Psychiatric: She has a normal mood and affect. Her behavior is normal. Judgment and thought content normal.   BP 122/78 mmHg  Pulse 97  Temp(Src) 97.9 F (36.6 C) (Oral)  Resp 18  Ht 5\' 6"  (1.676 m)  Wt 191 lb 3.2 oz (86.728 kg)  BMI 30.88 kg/m2  SpO2 97% Wt Readings from Last 3 Encounters:  08/06/14 191 lb 3.2 oz (86.728 kg)  06/05/13 183 lb (83.008 kg)  07/13/12 185 lb (83.915 kg)     Lab Results  Component Value Date   WBC 6.4 06/05/2013   HGB 12.6 06/05/2013   HCT 38.3 06/05/2013   PLT 277.0 06/05/2013   GLUCOSE 99 06/05/2013   CHOL 375* 06/05/2013   TRIG 383.0* 06/05/2013   HDL 36.80* 06/05/2013   LDLDIRECT 144.9 07/13/2012   LDLCALC 262* 06/05/2013  ALT 24 06/05/2013   AST 19 06/05/2013   NA 137 06/05/2013   K 3.3* 06/05/2013   CL 102 06/05/2013   CREATININE 1.0 06/05/2013   BUN 18 06/05/2013   CO2 23 06/05/2013   TSH 3.28 08/30/2013   MICROALBUR 0.8 07/13/2012    Dg Chest 2 View  06/05/2013   CLINICAL DATA:  Cough, congestion, shortness of breath  EXAM: CHEST  2 VIEW  COMPARISON:  None.  FINDINGS: Lungs are clear.  No pleural effusion or pneumothorax.  The heart is normal in size.  Mild  degenerative changes of the visualized thoracolumbar spine.  IMPRESSION: No evidence of acute cardiopulmonary disease.   Electronically Signed   By: Julian Hy M.D.   On: 06/05/2013 14:43     Assessment & Plan:  Plan I have discontinued Ms. Caicedo's amoxicillin-clavulanate and moxifloxacin. I have also changed her levothyroxine. Additionally, I am having her start on gabapentin. Lastly, I am having her maintain her calcium carbonate, cyclobenzaprine, Multiple Vitamins-Minerals (MULTIVITAMIN PO), fish oil-omega-3 fatty acids, glucosamine-chondroitin, Vitamin D, Cyanocobalamin (VITAMIN B-12 PO), HYDROcodone-acetaminophen, albuterol, lansoprazole, metoprolol tartrate, losartan-hydrochlorothiazide, potassium chloride SA, pravastatin, and venlafaxine XR.  Meds ordered this encounter  Medications  . gabapentin (NEURONTIN) 100 MG capsule    Sig: 1 po qhs x 3 days then 1 po bid x 1 week then 1 po tid    Dispense:  90 capsule    Refill:  1  . HYDROcodone-acetaminophen (NORCO/VICODIN) 5-325 MG per tablet    Sig: Take 1 tablet by mouth every 6 (six) hours as needed for moderate pain.    Dispense:  30 tablet    Refill:  0  . albuterol (PROVENTIL HFA;VENTOLIN HFA) 108 (90 BASE) MCG/ACT inhaler    Sig: Inhale 2 puffs into the lungs every 6 (six) hours as needed.    Dispense:  3 Inhaler    Refill:  1  . lansoprazole (PREVACID) 30 MG capsule    Sig: Take 1 capsule (30 mg total) by mouth daily as needed.    Dispense:  90 capsule    Refill:  0  . levothyroxine (SYNTHROID, LEVOTHROID) 125 MCG tablet    Sig: Take 1 tablet (125 mcg total) by mouth daily.    Dispense:  90 tablet    Refill:  1  . metoprolol tartrate (LOPRESSOR) 25 MG tablet    Sig: TAKE 1 TABLET BY MOUTH TWICE A DAY.    Dispense:  60 tablet    Refill:  0  . losartan-hydrochlorothiazide (HYZAAR) 100-12.5 MG per tablet    Sig: Take 1 tablet by mouth daily.    Dispense:  30 tablet    Refill:  0  . potassium chloride SA  (K-DUR,KLOR-CON) 20 MEQ tablet    Sig: Take 1 tablet (20 mEq total) by mouth daily.    Dispense:  30 tablet    Refill:  2  . pravastatin (PRAVACHOL) 20 MG tablet    Sig: Take 1 tablet (20 mg total) by mouth at bedtime.    Dispense:  30 tablet    Refill:  2  . venlafaxine XR (EFFEXOR-XR) 150 MG 24 hr capsule    Sig: take 1 capsule by mouth once daily with BREAKFAST    Dispense:  30 capsule    Refill:  0    PATIENT NEEDS TO BE SEEN BEFORE FURTHER REFILLS CAN BE GIVEN.    Problem List Items Addressed This Visit    Peripheral neuropathy   Relevant Medications   gabapentin (NEURONTIN)  100 MG capsule   venlafaxine XR (EFFEXOR-XR) 150 MG 24 hr capsule   Other Relevant Orders   Ambulatory referral to Neurology   PAIN IN JOINT, MULTIPLE SITES   Relevant Medications   HYDROcodone-acetaminophen (NORCO/VICODIN) 5-325 MG per tablet   ESOPHAGEAL REFLUX   Relevant Medications   lansoprazole (PREVACID) 30 MG capsule    Other Visit Diagnoses    Breast cancer screening    -  Primary    Relevant Orders    MM Digital Screening    Need for prophylactic vaccination against Streptococcus pneumoniae (pneumococcus)        Relevant Orders    Pneumococcal conjugate vaccine 13-valent    Breast cancer screening, high risk patient        Relevant Orders    MM Digital Screening    Hyperlipemia        Relevant Medications    metoprolol tartrate (LOPRESSOR) 25 MG tablet    losartan-hydrochlorothiazide (HYZAAR) 100-12.5 MG per tablet    pravastatin (PRAVACHOL) 20 MG tablet    Other Relevant Orders    Basic metabolic panel    CBC with Differential/Platelet    Hepatic function panel    POCT urinalysis dipstick    Essential hypertension        Relevant Medications    metoprolol tartrate (LOPRESSOR) 25 MG tablet    losartan-hydrochlorothiazide (HYZAAR) 100-12.5 MG per tablet    potassium chloride SA (K-DUR,KLOR-CON) 20 MEQ tablet    pravastatin (PRAVACHOL) 20 MG tablet    Other Relevant Orders     Basic metabolic panel    CBC with Differential/Platelet    Hepatic function panel    POCT urinalysis dipstick    Hyperglycemia        Relevant Orders    Hemoglobin A1c    COPD exacerbation        Relevant Medications    albuterol (PROVENTIL HFA;VENTOLIN HFA) 108 (90 BASE) MCG/ACT inhaler    Hypothyroidism, unspecified hypothyroidism type        Relevant Medications    levothyroxine (SYNTHROID, LEVOTHROID) 125 MCG tablet    metoprolol tartrate (LOPRESSOR) 25 MG tablet    Depression        Relevant Medications    venlafaxine XR (EFFEXOR-XR) 150 MG 24 hr capsule       Follow-up: Return in about 6 months (around 02/05/2015), or if symptoms worsen or fail to improve, for annual exam, fasting.  Garnet Koyanagi, DO

## 2014-08-06 NOTE — Patient Instructions (Signed)

## 2014-08-07 LAB — POCT URINALYSIS DIPSTICK
Bilirubin, UA: NEGATIVE
Blood, UA: NEGATIVE
Glucose, UA: NEGATIVE
Ketones, UA: NEGATIVE
LEUKOCYTES UA: NEGATIVE
Nitrite, UA: NEGATIVE
PH UA: 6
Protein, UA: NEGATIVE
Urobilinogen, UA: 4

## 2014-08-09 ENCOUNTER — Telehealth: Payer: Self-pay | Admitting: Family Medicine

## 2014-08-09 NOTE — Telephone Encounter (Signed)
Reviewed labs with patient and she verbalized understanding, she wanted her cholesterol checked and wanted to know why it was not done. I made her aware labs were done based on her symptoms and she verbalized understanding. I offered her to come back and to get her Lipids checked and she said she would call me back one day next week.    KP

## 2014-08-09 NOTE — Telephone Encounter (Signed)
Relation to pt: self  Call back number: [phone]   Reason for call:  Pt received lab results in mail in need of clarification.

## 2014-08-30 ENCOUNTER — Telehealth: Payer: Self-pay | Admitting: Family Medicine

## 2014-08-30 NOTE — Telephone Encounter (Signed)
Flexeril is listed as Historical, not filled here. Please advise     KP

## 2014-08-30 NOTE — Telephone Encounter (Signed)
Ok to refill flexeril x1--- #30 pills,  Tid prn

## 2014-08-30 NOTE — Telephone Encounter (Signed)
Relation to pt: self Call back number: [phone] Pharmacy: [facility], [address] [phone] (Phone) [phone] (Fax)         Reason for call:  Patient states requesting a refill cyclobenzaprine (FLEXERIL) 10 MG tablet and pravastatin (PRAVACHOL) 20 MG tablet

## 2014-09-02 MED ORDER — CYCLOBENZAPRINE HCL 10 MG PO TABS: 10.0000 mg | ORAL_TABLET | Freq: Three times a day (TID) | ORAL | Status: DC

## 2014-09-02 NOTE — Telephone Encounter (Signed)
Rx faxed.    KP 

## 2014-09-27 ENCOUNTER — Other Ambulatory Visit: Payer: Self-pay

## 2014-09-27 DIAGNOSIS — F32A Depression, unspecified: Secondary | ICD-10-CM

## 2014-09-27 DIAGNOSIS — I10 Essential (primary) hypertension: Secondary | ICD-10-CM

## 2014-09-27 DIAGNOSIS — F329 Major depressive disorder, single episode, unspecified: Secondary | ICD-10-CM

## 2014-09-27 MED ORDER — LOSARTAN POTASSIUM-HCTZ 100-12.5 MG PO TABS: 1.0000 | ORAL_TABLET | Freq: Every day | ORAL | Status: DC

## 2014-09-27 MED ORDER — VENLAFAXINE HCL ER 150 MG PO CP24: ORAL_CAPSULE | ORAL | Status: DC

## 2014-11-13 ENCOUNTER — Other Ambulatory Visit: Payer: Self-pay | Admitting: Family Medicine

## 2014-11-27 DIAGNOSIS — Z23 Encounter for immunization: Secondary | ICD-10-CM | POA: Diagnosis not present

## 2015-02-17 ENCOUNTER — Encounter: Payer: Medicare Other | Admitting: Family Medicine

## 2015-03-10 ENCOUNTER — Telehealth: Payer: Self-pay | Admitting: Family Medicine

## 2015-03-10 DIAGNOSIS — I10 Essential (primary) hypertension: Secondary | ICD-10-CM

## 2015-03-10 DIAGNOSIS — F329 Major depressive disorder, single episode, unspecified: Secondary | ICD-10-CM

## 2015-03-10 DIAGNOSIS — F32A Depression, unspecified: Secondary | ICD-10-CM

## 2015-03-10 DIAGNOSIS — E039 Hypothyroidism, unspecified: Secondary | ICD-10-CM

## 2015-03-10 MED ORDER — METOPROLOL TARTRATE 25 MG PO TABS: 25.0000 mg | ORAL_TABLET | Freq: Two times a day (BID) | ORAL | Status: DC

## 2015-03-10 MED ORDER — LOSARTAN POTASSIUM-HCTZ 100-12.5 MG PO TABS: 1.0000 | ORAL_TABLET | Freq: Every day | ORAL | Status: DC

## 2015-03-10 MED ORDER — LEVOTHYROXINE SODIUM 125 MCG PO TABS: 125.0000 ug | ORAL_TABLET | Freq: Every day | ORAL | Status: DC

## 2015-03-10 MED ORDER — VENLAFAXINE HCL ER 150 MG PO CP24: ORAL_CAPSULE | ORAL | Status: DC

## 2015-03-10 NOTE — Telephone Encounter (Signed)
Patient aware all med's have been faxed.     KP

## 2015-03-10 NOTE — Telephone Encounter (Signed)
Caller name: Self  Can be reached: [phone]  Reason for call: Request refill on venlafaxine XR (EFFEXOR-XR) 150 MG 24 hr capsule [id number] and  levothyroxine (SYNTHROID, LEVOTHROID) 125 MCG tablet [id number] and losartan-hydrochlorothiazide (HYZAAR) 100-12.5 MG per tablet [id number] and metoprolol tartrate (LOPRESSOR) 25 MG tablet [id number]  Patient cancelled CPE 1/9 due to snow and has rescheduled for 5/23

## 2015-03-24 ENCOUNTER — Other Ambulatory Visit: Payer: Self-pay

## 2015-03-24 DIAGNOSIS — I10 Essential (primary) hypertension: Secondary | ICD-10-CM

## 2015-03-24 MED ORDER — LOSARTAN POTASSIUM-HCTZ 100-12.5 MG PO TABS: 1.0000 | ORAL_TABLET | Freq: Every day | ORAL | Status: DC

## 2015-05-06 ENCOUNTER — Other Ambulatory Visit: Payer: Self-pay | Admitting: Family Medicine

## 2015-07-01 ENCOUNTER — Encounter: Payer: Self-pay | Admitting: Family Medicine

## 2015-07-01 ENCOUNTER — Ambulatory Visit (INDEPENDENT_AMBULATORY_CARE_PROVIDER_SITE_OTHER): Payer: Medicare Other | Admitting: Family Medicine

## 2015-07-01 VITALS — BP 132/78 | HR 90 | Temp 98.4°F | Ht 66.0 in | Wt 190.8 lb

## 2015-07-01 DIAGNOSIS — Z23 Encounter for immunization: Secondary | ICD-10-CM

## 2015-07-01 DIAGNOSIS — Z0000 Encounter for general adult medical examination without abnormal findings: Secondary | ICD-10-CM | POA: Diagnosis not present

## 2015-07-01 DIAGNOSIS — F32A Depression, unspecified: Secondary | ICD-10-CM

## 2015-07-01 DIAGNOSIS — E785 Hyperlipidemia, unspecified: Secondary | ICD-10-CM | POA: Diagnosis not present

## 2015-07-01 DIAGNOSIS — R739 Hyperglycemia, unspecified: Secondary | ICD-10-CM | POA: Diagnosis not present

## 2015-07-01 DIAGNOSIS — M722 Plantar fascial fibromatosis: Secondary | ICD-10-CM

## 2015-07-01 DIAGNOSIS — F329 Major depressive disorder, single episode, unspecified: Secondary | ICD-10-CM

## 2015-07-01 DIAGNOSIS — Z1231 Encounter for screening mammogram for malignant neoplasm of breast: Secondary | ICD-10-CM

## 2015-07-01 DIAGNOSIS — Z1239 Encounter for other screening for malignant neoplasm of breast: Secondary | ICD-10-CM

## 2015-07-01 DIAGNOSIS — I10 Essential (primary) hypertension: Secondary | ICD-10-CM

## 2015-07-01 DIAGNOSIS — E039 Hypothyroidism, unspecified: Secondary | ICD-10-CM

## 2015-07-01 DIAGNOSIS — M2550 Pain in unspecified joint: Secondary | ICD-10-CM

## 2015-07-01 MED ORDER — HYDROCODONE-ACETAMINOPHEN 5-325 MG PO TABS: 1.0000 | ORAL_TABLET | Freq: Four times a day (QID) | ORAL | Status: DC | PRN

## 2015-07-01 NOTE — Patient Instructions (Signed)
Preventive Care for Adults, Female A healthy lifestyle and preventive care can promote health and wellness. Preventive health guidelines for women include the following key practices.  A routine yearly physical is a good way to check with your health care provider about your health and preventive screening. It is a chance to share any concerns and updates on your health and to receive a thorough exam.  Visit your dentist for a routine exam and preventive care every 6 months. Brush your teeth twice a day and floss once a day. Good oral hygiene prevents tooth decay and gum disease.  The frequency of eye exams is based on your age, health, family medical history, use of contact lenses, and other factors. Follow your health care provider's recommendations for frequency of eye exams.  Eat a healthy diet. Foods like vegetables, fruits, whole grains, low-fat dairy products, and lean protein foods contain the nutrients you need without too many calories. Decrease your intake of foods high in solid fats, added sugars, and salt. Eat the right amount of calories for you.Get information about a proper diet from your health care provider, if necessary.  Regular physical exercise is one of the most important things you can do for your health. Most adults should get at least 150 minutes of moderate-intensity exercise (any activity that increases your heart rate and causes you to sweat) each week. In addition, most adults need muscle-strengthening exercises on 2 or more days a week.  Maintain a healthy weight. The body mass index (BMI) is a screening tool to identify possible weight problems. It provides an estimate of body fat based on height and weight. Your health care provider can find your BMI and can help you achieve or maintain a healthy weight.For adults 20 years and older:  A BMI below 18.5 is considered underweight.  A BMI of 18.5 to 24.9 is normal.  A BMI of 25 to 29.9 is considered overweight.  A  BMI of 30 and above is considered obese.  Maintain normal blood lipids and cholesterol levels by exercising and minimizing your intake of saturated fat. Eat a balanced diet with plenty of fruit and vegetables. Blood tests for lipids and cholesterol should begin at age 45 and be repeated every 5 years. If your lipid or cholesterol levels are high, you are over 50, or you are at high risk for heart disease, you may need your cholesterol levels checked more frequently.Ongoing high lipid and cholesterol levels should be treated with medicines if diet and exercise are not working.  If you smoke, find out from your health care provider how to quit. If you do not use tobacco, do not start.  Lung cancer screening is recommended for adults aged 45-80 years who are at high risk for developing lung cancer because of a history of smoking. A yearly low-dose CT scan of the lungs is recommended for people who have at least a 30-pack-year history of smoking and are a current smoker or have quit within the past 15 years. A pack year of smoking is smoking an average of 1 pack of cigarettes a day for 1 year (for example: 1 pack a day for 30 years or 2 packs a day for 15 years). Yearly screening should continue until the smoker has stopped smoking for at least 15 years. Yearly screening should be stopped for people who develop a health problem that would prevent them from having lung cancer treatment.  If you are pregnant, do not drink alcohol. If you are  breastfeeding, be very cautious about drinking alcohol. If you are not pregnant and choose to drink alcohol, do not have more than 1 drink per day. One drink is considered to be 12 ounces (355 mL) of beer, 5 ounces (148 mL) of wine, or 1.5 ounces (44 mL) of liquor.  Avoid use of street drugs. Do not share needles with anyone. Ask for help if you need support or instructions about stopping the use of drugs.  High blood pressure causes heart disease and increases the risk  of stroke. Your blood pressure should be checked at least every 1 to 2 years. Ongoing high blood pressure should be treated with medicines if weight loss and exercise do not work.  If you are 55-79 years old, ask your health care provider if you should take aspirin to prevent strokes.  Diabetes screening is done by taking a blood sample to check your blood glucose level after you have not eaten for a certain period of time (fasting). If you are not overweight and you do not have risk factors for diabetes, you should be screened once every 3 years starting at age 45. If you are overweight or obese and you are 40-70 years of age, you should be screened for diabetes every year as part of your cardiovascular risk assessment.  Breast cancer screening is essential preventive care for women. You should practice "breast self-awareness." This means understanding the normal appearance and feel of your breasts and may include breast self-examination. Any changes detected, no matter how small, should be reported to a health care provider. Women in their 20s and 30s should have a clinical breast exam (CBE) by a health care provider as part of a regular health exam every 1 to 3 years. After age 40, women should have a CBE every year. Starting at age 40, women should consider having a mammogram (breast X-ray test) every year. Women who have a family history of breast cancer should talk to their health care provider about genetic screening. Women at a high risk of breast cancer should talk to their health care providers about having an MRI and a mammogram every year.  Breast cancer gene (BRCA)-related cancer risk assessment is recommended for women who have family members with BRCA-related cancers. BRCA-related cancers include breast, ovarian, tubal, and peritoneal cancers. Having family members with these cancers may be associated with an increased risk for harmful changes (mutations) in the breast cancer genes BRCA1 and  BRCA2. Results of the assessment will determine the need for genetic counseling and BRCA1 and BRCA2 testing.  Your health care provider may recommend that you be screened regularly for cancer of the pelvic organs (ovaries, uterus, and vagina). This screening involves a pelvic examination, including checking for microscopic changes to the surface of your cervix (Pap test). You may be encouraged to have this screening done every 3 years, beginning at age 21.  For women ages 30-65, health care providers may recommend pelvic exams and Pap testing every 3 years, or they may recommend the Pap and pelvic exam, combined with testing for human papilloma virus (HPV), every 5 years. Some types of HPV increase your risk of cervical cancer. Testing for HPV may also be done on women of any age with unclear Pap test results.  Other health care providers may not recommend any screening for nonpregnant women who are considered low risk for pelvic cancer and who do not have symptoms. Ask your health care provider if a screening pelvic exam is right for   you.  If you have had past treatment for cervical cancer or a condition that could lead to cancer, you need Pap tests and screening for cancer for at least 20 years after your treatment. If Pap tests have been discontinued, your risk factors (such as having a new sexual partner) need to be reassessed to determine if screening should resume. Some women have medical problems that increase the chance of getting cervical cancer. In these cases, your health care provider may recommend more frequent screening and Pap tests.  Colorectal cancer can be detected and often prevented. Most routine colorectal cancer screening begins at the age of 50 years and continues through age 75 years. However, your health care provider may recommend screening at an earlier age if you have risk factors for colon cancer. On a yearly basis, your health care provider may provide home test kits to check  for hidden blood in the stool. Use of a small camera at the end of a tube, to directly examine the colon (sigmoidoscopy or colonoscopy), can detect the earliest forms of colorectal cancer. Talk to your health care provider about this at age 50, when routine screening begins. Direct exam of the colon should be repeated every 5-10 years through age 75 years, unless early forms of precancerous polyps or small growths are found.  People who are at an increased risk for hepatitis B should be screened for this virus. You are considered at high risk for hepatitis B if:  You were born in a country where hepatitis B occurs often. Talk with your health care provider about which countries are considered high risk.  Your parents were born in a high-risk country and you have not received a shot to protect against hepatitis B (hepatitis B vaccine).  You have HIV or AIDS.  You use needles to inject street drugs.  You live with, or have sex with, someone who has hepatitis B.  You get hemodialysis treatment.  You take certain medicines for conditions like cancer, organ transplantation, and autoimmune conditions.  Hepatitis C blood testing is recommended for all people born from 1945 through 1965 and any individual with known risks for hepatitis C.  Practice safe sex. Use condoms and avoid high-risk sexual practices to reduce the spread of sexually transmitted infections (STIs). STIs include gonorrhea, chlamydia, syphilis, trichomonas, herpes, HPV, and human immunodeficiency virus (HIV). Herpes, HIV, and HPV are viral illnesses that have no cure. They can result in disability, cancer, and death.  You should be screened for sexually transmitted illnesses (STIs) including gonorrhea and chlamydia if:  You are sexually active and are younger than 24 years.  You are older than 24 years and your health care provider tells you that you are at risk for this type of infection.  Your sexual activity has changed  since you were last screened and you are at an increased risk for chlamydia or gonorrhea. Ask your health care provider if you are at risk.  If you are at risk of being infected with HIV, it is recommended that you take a prescription medicine daily to prevent HIV infection. This is called preexposure prophylaxis (PrEP). You are considered at risk if:  You are sexually active and do not regularly use condoms or know the HIV status of your partner(s).  You take drugs by injection.  You are sexually active with a partner who has HIV.  Talk with your health care provider about whether you are at high risk of being infected with HIV. If   you choose to begin PrEP, you should first be tested for HIV. You should then be tested every 3 months for as long as you are taking PrEP.  Osteoporosis is a disease in which the bones lose minerals and strength with aging. This can result in serious bone fractures or breaks. The risk of osteoporosis can be identified using a bone density scan. Women ages 67 years and over and women at risk for fractures or osteoporosis should discuss screening with their health care providers. Ask your health care provider whether you should take a calcium supplement or vitamin D to reduce the rate of osteoporosis.  Menopause can be associated with physical symptoms and risks. Hormone replacement therapy is available to decrease symptoms and risks. You should talk to your health care provider about whether hormone replacement therapy is right for you.  Use sunscreen. Apply sunscreen liberally and repeatedly throughout the day. You should seek shade when your shadow is shorter than you. Protect yourself by wearing long sleeves, pants, a wide-brimmed hat, and sunglasses year round, whenever you are outdoors.  Once a month, do a whole body skin exam, using a mirror to look at the skin on your back. Tell your health care provider of new moles, moles that have irregular borders, moles that  are larger than a pencil eraser, or moles that have changed in shape or color.  Stay current with required vaccines (immunizations).  Influenza vaccine. All adults should be immunized every year.  Tetanus, diphtheria, and acellular pertussis (Td, Tdap) vaccine. Pregnant women should receive 1 dose of Tdap vaccine during each pregnancy. The dose should be obtained regardless of the length of time since the last dose. Immunization is preferred during the 27th-36th week of gestation. An adult who has not previously received Tdap or who does not know her vaccine status should receive 1 dose of Tdap. This initial dose should be followed by tetanus and diphtheria toxoids (Td) booster doses every 10 years. Adults with an unknown or incomplete history of completing a 3-dose immunization series with Td-containing vaccines should begin or complete a primary immunization series including a Tdap dose. Adults should receive a Td booster every 10 years.  Varicella vaccine. An adult without evidence of immunity to varicella should receive 2 doses or a second dose if she has previously received 1 dose. Pregnant females who do not have evidence of immunity should receive the first dose after pregnancy. This first dose should be obtained before leaving the health care facility. The second dose should be obtained 4-8 weeks after the first dose.  Human papillomavirus (HPV) vaccine. Females aged 13-26 years who have not received the vaccine previously should obtain the 3-dose series. The vaccine is not recommended for use in pregnant females. However, pregnancy testing is not needed before receiving a dose. If a female is found to be pregnant after receiving a dose, no treatment is needed. In that case, the remaining doses should be delayed until after the pregnancy. Immunization is recommended for any person with an immunocompromised condition through the age of 61 years if she did not get any or all doses earlier. During the  3-dose series, the second dose should be obtained 4-8 weeks after the first dose. The third dose should be obtained 24 weeks after the first dose and 16 weeks after the second dose.  Zoster vaccine. One dose is recommended for adults aged 30 years or older unless certain conditions are present.  Measles, mumps, and rubella (MMR) vaccine. Adults born  before 1957 generally are considered immune to measles and mumps. Adults born in 1957 or later should have 1 or more doses of MMR vaccine unless there is a contraindication to the vaccine or there is laboratory evidence of immunity to each of the three diseases. A routine second dose of MMR vaccine should be obtained at least 28 days after the first dose for students attending postsecondary schools, health care workers, or international travelers. People who received inactivated measles vaccine or an unknown type of measles vaccine during 1963-1967 should receive 2 doses of MMR vaccine. People who received inactivated mumps vaccine or an unknown type of mumps vaccine before 1979 and are at high risk for mumps infection should consider immunization with 2 doses of MMR vaccine. For females of childbearing age, rubella immunity should be determined. If there is no evidence of immunity, females who are not pregnant should be vaccinated. If there is no evidence of immunity, females who are pregnant should delay immunization until after pregnancy. Unvaccinated health care workers born before 1957 who lack laboratory evidence of measles, mumps, or rubella immunity or laboratory confirmation of disease should consider measles and mumps immunization with 2 doses of MMR vaccine or rubella immunization with 1 dose of MMR vaccine.  Pneumococcal 13-valent conjugate (PCV13) vaccine. When indicated, a person who is uncertain of his immunization history and has no record of immunization should receive the PCV13 vaccine. All adults 65 years of age and older should receive this  vaccine. An adult aged 19 years or older who has certain medical conditions and has not been previously immunized should receive 1 dose of PCV13 vaccine. This PCV13 should be followed with a dose of pneumococcal polysaccharide (PPSV23) vaccine. Adults who are at high risk for pneumococcal disease should obtain the PPSV23 vaccine at least 8 weeks after the dose of PCV13 vaccine. Adults older than 67 years of age who have normal immune system function should obtain the PPSV23 vaccine dose at least 1 year after the dose of PCV13 vaccine.  Pneumococcal polysaccharide (PPSV23) vaccine. When PCV13 is also indicated, PCV13 should be obtained first. All adults aged 65 years and older should be immunized. An adult younger than age 65 years who has certain medical conditions should be immunized. Any person who resides in a nursing home or long-term care facility should be immunized. An adult smoker should be immunized. People with an immunocompromised condition and certain other conditions should receive both PCV13 and PPSV23 vaccines. People with human immunodeficiency virus (HIV) infection should be immunized as soon as possible after diagnosis. Immunization during chemotherapy or radiation therapy should be avoided. Routine use of PPSV23 vaccine is not recommended for American Indians, [place] Natives, or people younger than 65 years unless there are medical conditions that require PPSV23 vaccine. When indicated, people who have unknown immunization and have no record of immunization should receive PPSV23 vaccine. One-time revaccination 5 years after the first dose of PPSV23 is recommended for people aged 19-64 years who have chronic kidney failure, nephrotic syndrome, asplenia, or immunocompromised conditions. People who received 1-2 doses of PPSV23 before age 65 years should receive another dose of PPSV23 vaccine at age 65 years or later if at least 5 years have passed since the previous dose. Doses of PPSV23 are not  needed for people immunized with PPSV23 at or after age 65 years.  Meningococcal vaccine. Adults with asplenia or persistent complement component deficiencies should receive 2 doses of quadrivalent meningococcal conjugate (MenACWY-D) vaccine. The doses should be obtained   at least 2 months apart. Microbiologists working with certain meningococcal bacteria, [place] recruits, people at risk during an outbreak, and people who travel to or live in countries with a high rate of meningitis should be immunized. A first-year college student up through age 34 years who is living in a residence hall should receive a dose if she did not receive a dose on or after her 16th birthday. Adults who have certain high-risk conditions should receive one or more doses of vaccine.  Hepatitis A vaccine. Adults who wish to be protected from this disease, have certain high-risk conditions, work with hepatitis A-infected animals, work in hepatitis A research labs, or travel to or work in countries with a high rate of hepatitis A should be immunized. Adults who were previously unvaccinated and who anticipate close contact with an international adoptee during the first 60 days after arrival in the [place] from a country with a high rate of hepatitis A should be immunized.  Hepatitis B vaccine. Adults who wish to be protected from this disease, have certain high-risk conditions, may be exposed to blood or other infectious body fluids, are household contacts or sex partners of hepatitis B positive people, are clients or workers in certain care facilities, or travel to or work in countries with a high rate of hepatitis B should be immunized.  Haemophilus influenzae type b (Hib) vaccine. A previously unvaccinated person with asplenia or sickle cell disease or having a scheduled splenectomy should receive 1 dose of Hib vaccine. Regardless of previous immunization, a recipient of a hematopoietic stem cell transplant should receive a  3-dose series 6-12 months after her successful transplant. Hib vaccine is not recommended for adults with HIV infection. Preventive Services / Frequency Ages 35 to 4 years  Blood pressure check.** / Every 3-5 years.  Lipid and cholesterol check.** / Every 5 years beginning at age 60.  Clinical breast exam.** / Every 3 years for women in their 71s and 10s.  BRCA-related cancer risk assessment.** / For women who have family members with a BRCA-related cancer (breast, ovarian, tubal, or peritoneal cancers).  Pap test.** / Every 2 years from ages 76 through 26. Every 3 years starting at age 61 through age 76 or 93 with a history of 3 consecutive normal Pap tests.  HPV screening.** / Every 3 years from ages 37 through ages 60 to 51 with a history of 3 consecutive normal Pap tests.  Hepatitis C blood test.** / For any individual with known risks for hepatitis C.  Skin self-exam. / Monthly.  Influenza vaccine. / Every year.  Tetanus, diphtheria, and acellular pertussis (Tdap, Td) vaccine.** / Consult your health care provider. Pregnant women should receive 1 dose of Tdap vaccine during each pregnancy. 1 dose of Td every 10 years.  Varicella vaccine.** / Consult your health care provider. Pregnant females who do not have evidence of immunity should receive the first dose after pregnancy.  HPV vaccine. / 3 doses over 6 months, if 93 and younger. The vaccine is not recommended for use in pregnant females. However, pregnancy testing is not needed before receiving a dose.  Measles, mumps, rubella (MMR) vaccine.** / You need at least 1 dose of MMR if you were born in 1957 or later. You may also need a 2nd dose. For females of childbearing age, rubella immunity should be determined. If there is no evidence of immunity, females who are not pregnant should be vaccinated. If there is no evidence of immunity, females who are  pregnant should delay immunization until after pregnancy.  Pneumococcal  13-valent conjugate (PCV13) vaccine.** / Consult your health care provider.  Pneumococcal polysaccharide (PPSV23) vaccine.** / 1 to 2 doses if you smoke cigarettes or if you have certain conditions.  Meningococcal vaccine.** / 1 dose if you are age 68 to 8 years and a Market researcher living in a residence hall, or have one of several medical conditions, you need to get vaccinated against meningococcal disease. You may also need additional booster doses.  Hepatitis A vaccine.** / Consult your health care provider.  Hepatitis B vaccine.** / Consult your health care provider.  Haemophilus influenzae type b (Hib) vaccine.** / Consult your health care provider. Ages 7 to 53 years  Blood pressure check.** / Every year.  Lipid and cholesterol check.** / Every 5 years beginning at age 25 years.  Lung cancer screening. / Every year if you are aged 11-80 years and have a 30-pack-year history of smoking and currently smoke or have quit within the past 15 years. Yearly screening is stopped once you have quit smoking for at least 15 years or develop a health problem that would prevent you from having lung cancer treatment.  Clinical breast exam.** / Every year after age 48 years.  BRCA-related cancer risk assessment.** / For women who have family members with a BRCA-related cancer (breast, ovarian, tubal, or peritoneal cancers).  Mammogram.** / Every year beginning at age 41 years and continuing for as long as you are in good health. Consult with your health care provider.  Pap test.** / Every 3 years starting at age 65 years through age 37 or 70 years with a history of 3 consecutive normal Pap tests.  HPV screening.** / Every 3 years from ages 72 years through ages 60 to 40 years with a history of 3 consecutive normal Pap tests.  Fecal occult blood test (FOBT) of stool. / Every year beginning at age 21 years and continuing until age 5 years. You may not need to do this test if you get  a colonoscopy every 10 years.  Flexible sigmoidoscopy or colonoscopy.** / Every 5 years for a flexible sigmoidoscopy or every 10 years for a colonoscopy beginning at age 35 years and continuing until age 48 years.  Hepatitis C blood test.** / For all people born from [birth year] through 1965 and any individual with known risks for hepatitis C.  Skin self-exam. / Monthly.  Influenza vaccine. / Every year.  Tetanus, diphtheria, and acellular pertussis (Tdap/Td) vaccine.** / Consult your health care provider. Pregnant women should receive 1 dose of Tdap vaccine during each pregnancy. 1 dose of Td every 10 years.  Varicella vaccine.** / Consult your health care provider. Pregnant females who do not have evidence of immunity should receive the first dose after pregnancy.  Zoster vaccine.** / 1 dose for adults aged 30 years or older.  Measles, mumps, rubella (MMR) vaccine.** / You need at least 1 dose of MMR if you were born in 1957 or later. You may also need a second dose. For females of childbearing age, rubella immunity should be determined. If there is no evidence of immunity, females who are not pregnant should be vaccinated. If there is no evidence of immunity, females who are pregnant should delay immunization until after pregnancy.  Pneumococcal 13-valent conjugate (PCV13) vaccine.** / Consult your health care provider.  Pneumococcal polysaccharide (PPSV23) vaccine.** / 1 to 2 doses if you smoke cigarettes or if you have certain conditions.  Meningococcal vaccine.** /  Consult your health care provider.  Hepatitis A vaccine.** / Consult your health care provider.  Hepatitis B vaccine.** / Consult your health care provider.  Haemophilus influenzae type b (Hib) vaccine.** / Consult your health care provider. Ages 64 years and over  Blood pressure check.** / Every year.  Lipid and cholesterol check.** / Every 5 years beginning at age 23 years.  Lung cancer screening. / Every year if you  are aged 16-80 years and have a 30-pack-year history of smoking and currently smoke or have quit within the past 15 years. Yearly screening is stopped once you have quit smoking for at least 15 years or develop a health problem that would prevent you from having lung cancer treatment.  Clinical breast exam.** / Every year after age 74 years.  BRCA-related cancer risk assessment.** / For women who have family members with a BRCA-related cancer (breast, ovarian, tubal, or peritoneal cancers).  Mammogram.** / Every year beginning at age 44 years and continuing for as long as you are in good health. Consult with your health care provider.  Pap test.** / Every 3 years starting at age 58 years through age 22 or 39 years with 3 consecutive normal Pap tests. Testing can be stopped between 65 and 70 years with 3 consecutive normal Pap tests and no abnormal Pap or HPV tests in the past 10 years.  HPV screening.** / Every 3 years from ages 64 years through ages 70 or 61 years with a history of 3 consecutive normal Pap tests. Testing can be stopped between 65 and 70 years with 3 consecutive normal Pap tests and no abnormal Pap or HPV tests in the past 10 years.  Fecal occult blood test (FOBT) of stool. / Every year beginning at age 40 years and continuing until age 27 years. You may not need to do this test if you get a colonoscopy every 10 years.  Flexible sigmoidoscopy or colonoscopy.** / Every 5 years for a flexible sigmoidoscopy or every 10 years for a colonoscopy beginning at age 7 years and continuing until age 32 years.  Hepatitis C blood test.** / For all people born from [birth year] through 1967 and any individual with known risks for hepatitis C.  Osteoporosis screening.** / A one-time screening for women ages 30 years and over and women at risk for fractures or osteoporosis.  Skin self-exam. / Monthly.  Influenza vaccine. / Every year.  Tetanus, diphtheria, and acellular pertussis (Tdap/Td)  vaccine.** / 1 dose of Td every 10 years.  Varicella vaccine.** / Consult your health care provider.  Zoster vaccine.** / 1 dose for adults aged 35 years or older.  Pneumococcal 13-valent conjugate (PCV13) vaccine.** / Consult your health care provider.  Pneumococcal polysaccharide (PPSV23) vaccine.** / 1 dose for all adults aged 46 years and older.  Meningococcal vaccine.** / Consult your health care provider.  Hepatitis A vaccine.** / Consult your health care provider.  Hepatitis B vaccine.** / Consult your health care provider.  Haemophilus influenzae type b (Hib) vaccine.** / Consult your health care provider. ** Family history and personal history of risk and conditions may change your health care provider's recommendations.   This information is not intended to replace advice given to you by your health care provider. Make sure you discuss any questions you have with your health care provider.   Document Released: 03/23/2001 Document Revised: 02/15/2014 Document Reviewed: 06/22/2010 Elsevier Interactive Patient Education [employer].

## 2015-07-01 NOTE — Progress Notes (Signed)
Subjective:   Samantha Hanson is a 65 y.o. female who presents for Medicare Annual (Subsequent) preventive examination. Pt needs her meds refilled and c/o urinary frequency and odor.   She is also c/o L heel pain -- no known injury  Review of Systems:  Review of Systems  Constitutional: Negative for activity change, appetite change and fatigue.  HENT: Negative for hearing loss, congestion, tinnitus and ear discharge.  dentist q36mEyes: Negative for visual disturbance (see optho q1y -- vision corrected to 20/20 with glasses).  Respiratory: Negative for cough, chest tightness and shortness of breath.   Cardiovascular: Negative for chest pain, palpitations and leg swelling.  Gastrointestinal: Negative for abdominal pain, diarrhea, constipation and abdominal distention.  Genitourinary: Negative for urgency, frequency, decreased urine volume and difficulty urinating.  Musculoskeletal: Negative for back pain, arthralgias and gait problem.  Skin: Negative for color change, pallor and rash.  Neurological: Negative for dizziness, light-headedness, numbness and headaches.  Hematological: Negative for adenopathy. Does not bruise/bleed easily.  Psychiatric/Behavioral: Negative for suicidal ideas, confusion, sleep disturbance, self-injury, dysphoric mood, decreased concentration and agitation.             Objective:     Vitals: BP 132/78 mmHg  Pulse 90  Temp(Src) 98.4 F (36.9 C) (Oral)  Ht '5\' 6"'  (1.676 m)  Wt 190 lb 12.8 oz (86.546 kg)  BMI 30.81 kg/m2  SpO2 98%  Body mass index is 30.81 kg/(m^2). BP 132/78 mmHg  Pulse 90  Temp(Src) 98.4 F (36.9 C) (Oral)  Ht '5\' 6"'  (1.676 m)  Wt 190 lb 12.8 oz (86.546 kg)  BMI 30.81 kg/m2  SpO2 98% General appearance: alert, cooperative, appears stated age and no distress Head: Normocephalic, without obvious abnormality, atraumatic Eyes: negative findings: lids and lashes normal and pupils equal, round, reactive to light and  accomodation Ears: normal TM's and external ear canals both ears Nose: Nares normal. Septum midline. Mucosa normal. No drainage or sinus tenderness. Throat: lips, mucosa, and tongue normal; teeth and gums normal Neck: no adenopathy, no carotid bruit, no JVD, supple, symmetrical, trachea midline and thyroid not enlarged, symmetric, no tenderness/mass/nodules Back: symmetric, no curvature. ROM normal. No CVA tenderness. Lungs: clear to auscultation bilaterally Breasts: normal appearance, no masses or tenderness Heart: S1, S2 normal Abdomen: soft, non-tender; bowel sounds normal; no masses,  no organomegaly Pelvic: not indicated; post-menopausal, no abnormal Pap smears in past Extremities: extremities normal, atraumatic, no cyanosis or edema Pulses: 2+ and symmetric Skin: Skin color, texture, turgor normal. No rashes or lesions Lymph nodes: Cervical, supraclavicular, and axillary nodes normal. Neurologic: Alert and oriented X 3, normal strength and tone. Normal symmetric reflexes. Normal coordination and gait  Tobacco History  Smoking status  . Current Every Day Smoker  Smokeless tobacco  . Never Used     Ready to quit: Not Answered Counseling given: Not Answered   Past Medical History  Diagnosis Date  . Hyperlipidemia   . Thyroid disease   . MVP (mitral valve prolapse)   . Migraine    Past Surgical History  Procedure Laterality Date  . Bunionectomy      Right foot   Family History  Problem Relation Age of Onset  . Coronary artery disease Father   . Arthritis Father   . Heart disease Father     heart transplant candidate but too old--per pt  . Thyroid cancer Mother   . Osteoporosis Sister    History  Sexual Activity  . Sexual Activity: Not Currently  Outpatient Encounter Prescriptions as of 07/01/2015  Medication Sig  . albuterol (PROVENTIL HFA;VENTOLIN HFA) 108 (90 BASE) MCG/ACT inhaler Inhale 2 puffs into the lungs every 6 (six) hours as needed.  . calcium  carbonate 1250 MG capsule Take 1,250 mg by mouth daily.  . Cholecalciferol (VITAMIN D) 2000 UNITS CAPS Take by mouth.  . Cyanocobalamin (VITAMIN B-12 PO) Take 1 tablet by mouth daily.  . cyclobenzaprine (FLEXERIL) 10 MG tablet Take 1 tablet (10 mg total) by mouth 3 (three) times daily.  . fish oil-omega-3 fatty acids 1000 MG capsule Take 1 g by mouth daily.   [name] gabapentin (NEURONTIN) 100 MG capsule take 1 tablet by mouth at bedtime for 3 days then 1 tablet by mouth twice a day for 1 week then take 1 tablet by mouth three times a day  . glucosamine-chondroitin 500-400 MG tablet Take 1 tablet by mouth 3 (three) times daily.  [name] HYDROcodone-acetaminophen (NORCO/VICODIN) 5-325 MG tablet Take 1 tablet by mouth every 6 (six) hours as needed for moderate pain.  [name] lansoprazole (PREVACID) 30 MG capsule Take 1 capsule (30 mg total) by mouth daily as needed.  [name] levothyroxine (SYNTHROID, LEVOTHROID) 125 MCG tablet Take 1 tablet (125 mcg total) by mouth daily.  [name] losartan-hydrochlorothiazide (HYZAAR) 100-12.5 MG tablet Take 1 tablet by mouth daily.  . metoprolol tartrate (LOPRESSOR) 25 MG tablet Take 1 tablet (25 mg total) by mouth 2 (two) times daily.  . Multiple Vitamins-Minerals (MULTIVITAMIN PO) Take 1 tablet by mouth daily.  . potassium chloride SA (K-DUR,KLOR-CON) 20 MEQ tablet Take 1 tablet (20 mEq total) by mouth daily.  . pravastatin (PRAVACHOL) 20 MG tablet Take 1 tablet (20 mg total) by mouth at bedtime.  [name] venlafaxine XR (EFFEXOR-XR) 150 MG 24 hr capsule take 1 capsule by mouth once daily with BREAKFAST  . [DISCONTINUED] HYDROcodone-acetaminophen (NORCO/VICODIN) 5-325 MG per tablet Take 1 tablet by mouth every 6 (six) hours as needed for moderate pain.  . [DISCONTINUED] levothyroxine (SYNTHROID, LEVOTHROID) 125 MCG tablet Take 1 tablet (125 mcg total) by mouth daily.  . [DISCONTINUED] losartan-hydrochlorothiazide (HYZAAR) 100-12.5 MG tablet Take 1 tablet by mouth daily.  . [DISCONTINUED] metoprolol  tartrate (LOPRESSOR) 25 MG tablet Take 1 tablet (25 mg total) by mouth 2 (two) times daily.  . [DISCONTINUED] potassium chloride SA (K-DUR,KLOR-CON) 20 MEQ tablet Take 1 tablet (20 mEq total) by mouth daily.  . [DISCONTINUED] pravastatin (PRAVACHOL) 20 MG tablet Take 1 tablet (20 mg total) by mouth at bedtime.  . [DISCONTINUED] pravastatin (PRAVACHOL) 20 MG tablet Take 1 tablet (20 mg total) by mouth at bedtime.  . [DISCONTINUED] venlafaxine XR (EFFEXOR-XR) 150 MG 24 hr capsule take 1 capsule by mouth once daily with BREAKFAST   No facility-administered encounter medications on file as of 07/01/2015.    Activities of Daily Living In your present state of health, do you have any difficulty performing the following activities: 07/01/2015 08/06/2014  Hearing? N N  Vision? N Y  Difficulty concentrating or making decisions? N (No Data)  Walking or climbing stairs? N Y  Dressing or bathing? N N  Doing errands, shopping? N N    Patient Care Team: Ann Held, DO as PCP - General    Assessment:    cpe Exercise Activities and Dietary recommendations Current Exercise Habits: The patient does not participate in regular exercise at present, Exercise limited by: None identified  Goals    None     Fall Risk Fall Risk  07/01/2015 08/06/2014 07/13/2012  Falls in the past year? Yes No No  Number falls in past yr: 1 - -  Injury with Fall? No - -  Risk for fall due to : Other (Comment) - -  Risk for fall due to (comments): tripped over cats - -  Follow up Falls prevention discussed - -   Depression Screen PHQ 2/9 Scores 07/01/2015 08/06/2014 07/13/2012  PHQ - 2 Score 0 2 4  PHQ- 9 Score - - 17     Cognitive Testing mmse 30/30  Immunization History  Administered Date(s) Administered  . Influenza, High Dose Seasonal PF 12/08/2013, 11/27/2014  . Influenza,inj,Quad PF,36+ Mos 12/06/2012  . Pneumococcal Conjugate-13 08/06/2014  . Td 08/19/2004  . Zoster 08/27/2010   Screening  Tests Health Maintenance  Topic Date Due  . Hepatitis C Screening  05-27-1950  . MAMMOGRAM  03/05/2012  . TETANUS/TDAP  08/20/2014  . COLONOSCOPY  08/06/2015 (Originally 08/07/1998)  . PNA vac Low Risk Adult (2 of 2 - PPSV23) 08/06/2015  . INFLUENZA VACCINE  09/09/2015  . DEXA SCAN  Completed  . ZOSTAVAX  Completed      Plan:    see AVS During the course of the visit the patient was educated and counseled about the following appropriate screening and preventive services:   Vaccines to include Pneumoccal, Influenza, Hepatitis B, Td, Zostavax, HCV  Electrocardiogram  Cardiovascular Disease  Colorectal cancer screening  Bone density screening  Diabetes screening  Glaucoma screening  Mammography/PAP  Nutrition counseling   Patient Instructions (the written plan) was given to the patient.  1. Breast cancer screening  - MM DIGITAL SCREENING BILATERAL; Future - HYDROcodone-acetaminophen (NORCO/VICODIN) 5-325 MG tablet; Take 1 tablet by mouth every 6 (six) hours as needed for moderate pain.  Dispense: 30 tablet; Refill: 0 - Basic Metabolic Panel (BMET) - Urinalysis - Hepatic function panel - CBC with Differential/Platelet - TSH  2. Need for Tdap vaccination  - HYDROcodone-acetaminophen (NORCO/VICODIN) 5-325 MG tablet; Take 1 tablet by mouth every 6 (six) hours as needed for moderate pain.  Dispense: 30 tablet; Refill: 0 - Basic Metabolic Panel (BMET) - Urinalysis - Hepatic function panel - CBC with Differential/Platelet - TSH  3. Pain in joint, multiple sites  - HYDROcodone-acetaminophen (NORCO/VICODIN) 5-325 MG tablet; Take 1 tablet by mouth every 6 (six) hours as needed for moderate pain.  Dispense: 30 tablet; Refill: 0 - Basic Metabolic Panel (BMET) - Urinalysis - Hepatic function panel - CBC with Differential/Platelet - TSH  4. Breast cancer screening, high risk patient  - MM DIGITAL SCREENING BILATERAL; Future - HYDROcodone-acetaminophen (NORCO/VICODIN)  5-325 MG tablet; Take 1 tablet by mouth every 6 (six) hours as needed for moderate pain.  Dispense: 30 tablet; Refill: 0 - Basic Metabolic Panel (BMET) - Urinalysis - Hepatic function panel - CBC with Differential/Platelet - TSH  5. Essential hypertension con't metoprolol and losartan - HYDROcodone-acetaminophen (NORCO/VICODIN) 5-325 MG tablet; Take 1 tablet by mouth every 6 (six) hours as needed for moderate pain.  Dispense: 30 tablet; Refill: 0 - Basic Metabolic Panel (BMET) - Urinalysis - Hepatic function panel - CBC with Differential/Platelet - TSH  6. Hypothyroidism, unspecified hypothyroidism type   - HYDROcodone-acetaminophen (NORCO/VICODIN) 5-325 MG tablet; Take 1 tablet by mouth every 6 (six) hours as needed for moderate pain.  Dispense: 30 tablet; Refill: 0 - Basic Metabolic Panel (BMET) - Urinalysis - Hepatic function panel - CBC with Differential/Platelet - TSH  7. Hyperlipemia  - HYDROcodone-acetaminophen (NORCO/VICODIN) 5-325 MG tablet; Take 1 tablet by mouth every  6 (six) hours as needed for moderate pain.  Dispense: 30 tablet; Refill: 0 - Basic Metabolic Panel (BMET) - Urinalysis - Hepatic function panel - CBC with Differential/Platelet - TSH - Lipid panel; Future  8. Hyperglycemia  - Hemoglobin A1c - Hemoglobin A1c; Future  9. Preventative health care See above - Lipid panel; Future - Hemoglobin A1c; Future - Comp Met (CMET); Future  10. Routine history and physical examination of adult See above  11. Plantar fasciitis Stretches shown Ice Change foot wear Gel inserts  - Ambulatory referral to [facility], DO  07/10/2015

## 2015-07-02 LAB — CBC WITH DIFFERENTIAL/PLATELET
BASOS PCT: 0.7 % (ref 0.0–3.0)
Basophils Absolute: 0.1 10*3/uL (ref 0.0–0.1)
Eosinophils Absolute: 0.1 10*3/uL (ref 0.0–0.7)
Eosinophils Relative: 1.8 % (ref 0.0–5.0)
HEMATOCRIT: 39.6 % (ref 36.0–46.0)
Hemoglobin: 12.9 g/dL (ref 12.0–15.0)
LYMPHS ABS: 2.2 10*3/uL (ref 0.7–4.0)
LYMPHS PCT: 29.7 % (ref 12.0–46.0)
MCHC: 32.5 g/dL (ref 30.0–36.0)
MCV: 90.5 fl (ref 78.0–100.0)
MONOS PCT: 3.6 % (ref 3.0–12.0)
Monocytes Absolute: 0.3 10*3/uL (ref 0.1–1.0)
NEUTROS ABS: 4.7 10*3/uL (ref 1.4–7.7)
Neutrophils Relative %: 64.2 % (ref 43.0–77.0)
PLATELETS: 268 10*3/uL (ref 150.0–400.0)
RBC: 4.38 Mil/uL (ref 3.87–5.11)
RDW: 15.8 % — AB (ref 11.5–15.5)
WBC: 7.3 10*3/uL (ref 4.0–10.5)

## 2015-07-02 LAB — BASIC METABOLIC PANEL
BUN: 14 mg/dL (ref 6–23)
CHLORIDE: 102 meq/L (ref 96–112)
CO2: 28 meq/L (ref 19–32)
CREATININE: 0.9 mg/dL (ref 0.40–1.20)
Calcium: 9.8 mg/dL (ref 8.4–10.5)
GFR: 66.4 mL/min (ref >60.00)
Glucose, Bld: 97 mg/dL (ref 70–99)
Potassium: 4 mEq/L (ref 3.5–5.1)
Sodium: 135 mEq/L (ref 135–145)

## 2015-07-02 LAB — HEPATIC FUNCTION PANEL
ALT: 25 U/L (ref 0–35)
AST: 23 U/L (ref 0–37)
Albumin: 4.4 g/dL (ref 3.5–5.2)
Alkaline Phosphatase: 51 U/L (ref 39–117)
BILIRUBIN DIRECT: 0 mg/dL (ref 0.0–0.3)
BILIRUBIN TOTAL: 0.5 mg/dL (ref 0.2–1.2)
Total Protein: 7.1 g/dL (ref 6.0–8.3)

## 2015-07-02 LAB — HEMOGLOBIN A1C: Hgb A1c MFr Bld: 6.5 % (ref 4.6–6.5)

## 2015-07-02 LAB — URINALYSIS
Bilirubin Urine: NEGATIVE
Hgb urine dipstick: NEGATIVE
LEUKOCYTES UA: NEGATIVE
Nitrite: NEGATIVE
SPECIFIC GRAVITY, URINE: 1.02 (ref 1.000–1.030)
Total Protein, Urine: NEGATIVE
URINE GLUCOSE: NEGATIVE
Urobilinogen, UA: 0.2 (ref 0.0–1.0)
pH: 5.5 (ref 5.0–8.0)

## 2015-07-02 LAB — TSH: TSH: 1.57 u[IU]/mL (ref 0.35–4.50)

## 2015-07-09 ENCOUNTER — Telehealth: Payer: Self-pay | Admitting: Family Medicine

## 2015-07-09 ENCOUNTER — Encounter: Payer: Self-pay | Admitting: Family Medicine

## 2015-07-09 NOTE — Telephone Encounter (Signed)
error 

## 2015-07-10 ENCOUNTER — Encounter: Payer: Self-pay | Admitting: Family Medicine

## 2015-07-10 MED ORDER — PRAVASTATIN SODIUM 20 MG PO TABS: 20.0000 mg | ORAL_TABLET | Freq: Every day | ORAL | Status: DC

## 2015-07-10 MED ORDER — VENLAFAXINE HCL ER 150 MG PO CP24: ORAL_CAPSULE | ORAL | Status: DC

## 2015-07-10 MED ORDER — POTASSIUM CHLORIDE CRYS ER 20 MEQ PO TBCR: 20.0000 meq | EXTENDED_RELEASE_TABLET | Freq: Every day | ORAL | Status: DC

## 2015-07-10 MED ORDER — LEVOTHYROXINE SODIUM 125 MCG PO TABS: 125.0000 ug | ORAL_TABLET | Freq: Every day | ORAL | Status: DC

## 2015-07-10 MED ORDER — METOPROLOL TARTRATE 25 MG PO TABS: 25.0000 mg | ORAL_TABLET | Freq: Two times a day (BID) | ORAL | Status: DC

## 2015-07-10 MED ORDER — LOSARTAN POTASSIUM-HCTZ 100-12.5 MG PO TABS: 1.0000 | ORAL_TABLET | Freq: Every day | ORAL | Status: DC

## 2015-07-10 NOTE — Telephone Encounter (Signed)
There is an order for Lipid but it was not done.   Can it be added?

## 2015-07-10 NOTE — Assessment & Plan Note (Signed)
con't metoprolol and losartan hct

## 2015-07-10 NOTE — Telephone Encounter (Signed)
The order for lipid id a future order. It has been 9 days so the patient will need to come back.     KP

## 2015-07-21 ENCOUNTER — Ambulatory Visit: Payer: Medicare Other | Admitting: Podiatry

## 2015-07-24 ENCOUNTER — Other Ambulatory Visit: Payer: Self-pay | Admitting: Family Medicine

## 2015-09-22 ENCOUNTER — Other Ambulatory Visit: Payer: Self-pay | Admitting: Family Medicine

## 2015-09-22 DIAGNOSIS — E039 Hypothyroidism, unspecified: Secondary | ICD-10-CM

## 2015-11-02 DIAGNOSIS — Z23 Encounter for immunization: Secondary | ICD-10-CM | POA: Diagnosis not present

## 2015-11-16 ENCOUNTER — Other Ambulatory Visit: Payer: Self-pay | Admitting: Family Medicine

## 2015-12-30 ENCOUNTER — Other Ambulatory Visit: Payer: Self-pay | Admitting: Family Medicine

## 2016-01-04 ENCOUNTER — Encounter: Payer: Self-pay | Admitting: Family Medicine

## 2016-01-20 ENCOUNTER — Other Ambulatory Visit: Payer: Self-pay | Admitting: Family Medicine

## 2016-01-20 NOTE — Telephone Encounter (Signed)
30 day supply of venlafaxine sent to pharmacy. Pt was due for follow up with Dr Carollee Herter on 01/01/16 and is past due. She will need to be seen before further refills will be given. Please call pt to schedule appt. Thanks!

## 2016-01-27 ENCOUNTER — Other Ambulatory Visit: Payer: Self-pay | Admitting: Family Medicine

## 2016-01-27 DIAGNOSIS — I10 Essential (primary) hypertension: Secondary | ICD-10-CM

## 2016-02-03 NOTE — Telephone Encounter (Signed)
Appt scheduled

## 2016-02-17 ENCOUNTER — Other Ambulatory Visit: Payer: Self-pay | Admitting: Family Medicine

## 2016-03-12 ENCOUNTER — Ambulatory Visit (INDEPENDENT_AMBULATORY_CARE_PROVIDER_SITE_OTHER): Payer: Medicare Other | Admitting: Family Medicine

## 2016-03-12 ENCOUNTER — Encounter: Payer: Self-pay | Admitting: Family Medicine

## 2016-03-12 VITALS — BP 116/70 | HR 75 | Temp 98.4°F | Resp 16 | Ht 66.0 in | Wt 191.8 lb

## 2016-03-12 DIAGNOSIS — G6289 Other specified polyneuropathies: Secondary | ICD-10-CM

## 2016-03-12 DIAGNOSIS — I10 Essential (primary) hypertension: Secondary | ICD-10-CM

## 2016-03-12 DIAGNOSIS — R269 Unspecified abnormalities of gait and mobility: Secondary | ICD-10-CM

## 2016-03-12 DIAGNOSIS — R739 Hyperglycemia, unspecified: Secondary | ICD-10-CM | POA: Diagnosis not present

## 2016-03-12 DIAGNOSIS — R42 Dizziness and giddiness: Secondary | ICD-10-CM

## 2016-03-12 DIAGNOSIS — E039 Hypothyroidism, unspecified: Secondary | ICD-10-CM | POA: Diagnosis not present

## 2016-03-12 DIAGNOSIS — M545 Low back pain: Secondary | ICD-10-CM

## 2016-03-12 DIAGNOSIS — E785 Hyperlipidemia, unspecified: Secondary | ICD-10-CM | POA: Diagnosis not present

## 2016-03-12 DIAGNOSIS — R296 Repeated falls: Secondary | ICD-10-CM

## 2016-03-12 LAB — LIPID PANEL
CHOL/HDL RATIO: 10
CHOLESTEROL: 331 mg/dL — AB (ref 0–200)
HDL: 33.6 mg/dL — ABNORMAL LOW (ref >39.00)
NonHDL: 297.56
Triglycerides: 382 mg/dL — ABNORMAL HIGH (ref 0.0–149.0)
VLDL: 76.4 mg/dL — AB (ref 0.0–40.0)

## 2016-03-12 LAB — COMPREHENSIVE METABOLIC PANEL
ALT: 18 U/L (ref 0–35)
AST: 15 U/L (ref 0–37)
Albumin: 4.3 g/dL (ref 3.5–5.2)
Alkaline Phosphatase: 45 U/L (ref 39–117)
BUN: 16 mg/dL (ref 6–23)
CHLORIDE: 101 meq/L (ref 96–112)
CO2: 26 mEq/L (ref 19–32)
Calcium: 9.6 mg/dL (ref 8.4–10.5)
Creatinine, Ser: 0.9 mg/dL (ref 0.40–1.20)
GFR: 66.26 mL/min (ref >60.00)
GLUCOSE: 106 mg/dL — AB (ref 70–99)
POTASSIUM: 3.7 meq/L (ref 3.5–5.1)
SODIUM: 133 meq/L — AB (ref 135–145)
Total Bilirubin: 0.4 mg/dL (ref 0.2–1.2)
Total Protein: 7.3 g/dL (ref 6.0–8.3)

## 2016-03-12 LAB — LDL CHOLESTEROL, DIRECT: LDL DIRECT: 99 mg/dL

## 2016-03-12 LAB — VITAMIN B12: VITAMIN B 12: 388 pg/mL (ref 211–911)

## 2016-03-12 LAB — TSH: TSH: 1.5 u[IU]/mL (ref 0.35–4.50)

## 2016-03-12 LAB — HEMOGLOBIN A1C: HEMOGLOBIN A1C: 6.4 % (ref 4.6–6.5)

## 2016-03-12 MED ORDER — VENLAFAXINE HCL ER 150 MG PO CP24: ORAL_CAPSULE | ORAL | 1 refills | Status: DC

## 2016-03-12 MED ORDER — LOSARTAN POTASSIUM-HCTZ 100-12.5 MG PO TABS: 1.0000 | ORAL_TABLET | Freq: Every day | ORAL | 1 refills | Status: DC

## 2016-03-12 MED ORDER — HYDROCODONE-ACETAMINOPHEN 5-325 MG PO TABS
1.0000 | ORAL_TABLET | Freq: Four times a day (QID) | ORAL | 0 refills | Status: DC | PRN
Start: 2016-03-12 — End: 2016-07-19

## 2016-03-12 MED ORDER — METOPROLOL TARTRATE 25 MG PO TABS: 25.0000 mg | ORAL_TABLET | Freq: Two times a day (BID) | ORAL | 1 refills | Status: DC

## 2016-03-12 NOTE — Progress Notes (Signed)
Pre visit review using our clinic review tool, if applicable. No additional management support is needed unless otherwise documented below in the visit note. 

## 2016-03-12 NOTE — Assessment & Plan Note (Signed)
Stable con't meds 

## 2016-03-12 NOTE — Assessment & Plan Note (Signed)
Check labs con't meds 

## 2016-03-12 NOTE — Patient Instructions (Signed)

## 2016-03-12 NOTE — Assessment & Plan Note (Signed)
Pt does not want referral at this time May be related to vertigo Check labs Encouraged pt to see neuro-- but she wantss to wait

## 2016-03-12 NOTE — Progress Notes (Signed)
Subjective:    Patient ID: Samantha Hanson, female    DOB: March 25, 1950, 66 y.o.   MRN: [id number]  Chief Complaint  Patient presents with  . Follow-up    blood pressure, cholesterol, thyroid, and anxiety    HPI Patient is in today for follow up blood pressure, anxiety, thyroid, and cholesterol.  Also complains of some low back pain.  She fell while cleaning 8 days ago.  Past Medical History:  Diagnosis Date  . Hyperlipidemia   . Migraine   . MVP (mitral valve prolapse)   . Thyroid disease     Past Surgical History:  Procedure Laterality Date  . BUNIONECTOMY     Right foot    Family History  Problem Relation Age of Onset  . Coronary artery disease Father   . Arthritis Father   . Heart disease Father     heart transplant candidate but too old--per pt  . Thyroid cancer Mother   . Osteoporosis Sister     Social History   Social History  . Marital status: Married    Spouse name: N/A  . Number of children: N/A  . Years of education: N/A   Occupational History  . Not on file.   Social History Main Topics  . Smoking status: Current Every Day Smoker  . Smokeless tobacco: Never Used  . Alcohol use No  . Drug use: No  . Sexual activity: Not Currently   Other Topics Concern  . Not on file   Social History Narrative  . No narrative on file    Outpatient Medications Prior to Visit  Medication Sig Dispense Refill  . albuterol (PROVENTIL HFA;VENTOLIN HFA) 108 (90 BASE) MCG/ACT inhaler Inhale 2 puffs into the lungs every 6 (six) hours as needed. 3 Inhaler 1  . calcium carbonate 1250 MG capsule Take 1,250 mg by mouth daily.    . Cholecalciferol (VITAMIN D) 2000 UNITS CAPS Take by mouth.    . Cyanocobalamin (VITAMIN B-12 PO) Take 1 tablet by mouth daily.    . cyclobenzaprine (FLEXERIL) 10 MG tablet Take 1 tablet (10 mg total) by mouth 3 (three) times daily. 30 tablet 0  . fish oil-omega-3 fatty acids 1000 MG capsule Take 1 g by mouth daily.     [name] gabapentin  (NEURONTIN) 100 MG capsule take 1 capsule by mouth at bedtime for 3 days then 1 capsule by mouth twice a day for 1 week then take 1 capsule by mouth three times a day 90 capsule 1  . glucosamine-chondroitin 500-400 MG tablet Take 1 tablet by mouth 3 (three) times daily.    [name] levothyroxine (SYNTHROID, LEVOTHROID) 125 MCG tablet take 1 tablet by mouth once daily 90 tablet 3  . Multiple Vitamins-Minerals (MULTIVITAMIN PO) Take 1 tablet by mouth daily.    . potassium chloride SA (K-DUR,KLOR-CON) 20 MEQ tablet Take 1 tablet (20 mEq total) by mouth daily. 30 tablet 2  . HYDROcodone-acetaminophen (NORCO/VICODIN) 5-325 MG tablet Take 1 tablet by mouth every 6 (six) hours as needed for moderate pain. 30 tablet 0  . losartan-hydrochlorothiazide (HYZAAR) 100-12.5 MG tablet take 1 tablet by mouth once daily 30 tablet 3  . metoprolol tartrate (LOPRESSOR) 25 MG tablet Take 1 tablet (25 mg total) by mouth 2 (two) times daily. 60 tablet 3  . metoprolol tartrate (LOPRESSOR) 25 MG tablet take 1 tablet by mouth twice a day 60 tablet 3  . venlafaxine XR (EFFEXOR-XR) 150 MG 24 hr capsule take 1 capsule by mouth every morning  WITH BREAKFAST 30 capsule 0  . lansoprazole (PREVACID) 30 MG capsule Take 1 capsule (30 mg total) by mouth daily as needed. 90 capsule 0  . pravastatin (PRAVACHOL) 20 MG tablet Take 1 tablet (20 mg total) by mouth at bedtime. 30 tablet 2   No facility-administered medications prior to visit.     No Known Allergies  Review of Systems  Constitutional: Negative for fever and malaise/fatigue.  HENT: Negative for congestion.   Eyes: Negative for blurred vision.  Respiratory: Negative for cough and shortness of breath.   Cardiovascular: Negative for chest pain, palpitations and leg swelling.  Gastrointestinal: Negative for vomiting.  Musculoskeletal: Positive for back pain.  Skin: Negative for rash.  Neurological: Negative for loss of consciousness and headaches.       Objective:      Physical Exam  Constitutional: She is oriented to person, place, and time. She appears well-developed and well-nourished. No distress.  HENT:  Head: Normocephalic and atraumatic.  Eyes: Conjunctivae are normal.  Neck: Normal range of motion. No thyromegaly present.  Cardiovascular: Normal rate and regular rhythm.   Pulmonary/Chest: Effort normal and breath sounds normal. She has no wheezes.  Abdominal: Soft. Bowel sounds are normal. There is no tenderness.  Musculoskeletal: Normal range of motion. She exhibits tenderness. She exhibits no edema or deformity.  Neurological: She is alert and oriented to person, place, and time. She has normal reflexes. She displays normal reflexes. She exhibits normal muscle tone. Coordination and gait normal.  +SLR R leg +45 degrees   Skin: Skin is warm and dry. She is not diaphoretic.  Psychiatric: She has a normal mood and affect.  Nursing note and vitals reviewed.   BP 116/70 (BP Location: Left Arm, Cuff Size: Normal)   Pulse 75   Temp 98.4 F (36.9 C) (Oral)   Resp 16   Ht 5\' 6"  (1.676 m)   Wt 191 lb 12.8 oz (87 kg)   SpO2 98%   BMI 30.96 kg/m  Wt Readings from Last 3 Encounters:  03/12/16 191 lb 12.8 oz (87 kg)  07/01/15 190 lb 12.8 oz (86.5 kg)  08/06/14 191 lb 3.2 oz (86.7 kg)     Lab Results  Component Value Date   WBC 7.3 07/01/2015   HGB 12.9 07/01/2015   HCT 39.6 07/01/2015   PLT 268.0 07/01/2015   GLUCOSE 106 (H) 03/12/2016   CHOL 331 (H) 03/12/2016   TRIG 382.0 (H) 03/12/2016   HDL 33.60 (L) 03/12/2016   LDLDIRECT 99.0 03/12/2016   LDLCALC 262 (H) 06/05/2013   ALT 18 03/12/2016   AST 15 03/12/2016   NA 133 (L) 03/12/2016   K 3.7 03/12/2016   CL 101 03/12/2016   CREATININE 0.90 03/12/2016   BUN 16 03/12/2016   CO2 26 03/12/2016   TSH 1.50 03/12/2016   HGBA1C 6.4 03/12/2016   MICROALBUR 0.8 07/13/2012    Lab Results  Component Value Date   TSH 1.50 03/12/2016   Lab Results  Component Value Date   WBC 7.3  07/01/2015   HGB 12.9 07/01/2015   HCT 39.6 07/01/2015   MCV 90.5 07/01/2015   PLT 268.0 07/01/2015   Lab Results  Component Value Date   NA 133 (L) 03/12/2016   K 3.7 03/12/2016   CO2 26 03/12/2016   GLUCOSE 106 (H) 03/12/2016   BUN 16 03/12/2016   CREATININE 0.90 03/12/2016   BILITOT 0.4 03/12/2016   ALKPHOS 45 03/12/2016   AST 15 03/12/2016   ALT 18  03/12/2016   PROT 7.3 03/12/2016   ALBUMIN 4.3 03/12/2016   CALCIUM 9.6 03/12/2016   GFR 66.26 03/12/2016   Lab Results  Component Value Date   CHOL 331 (H) 03/12/2016   Lab Results  Component Value Date   HDL 33.60 (L) 03/12/2016   Lab Results  Component Value Date   LDLCALC 262 (H) 06/05/2013   Lab Results  Component Value Date   TRIG 382.0 (H) 03/12/2016   Lab Results  Component Value Date   CHOLHDL 10 03/12/2016   Lab Results  Component Value Date   HGBA1C 6.4 03/12/2016       Assessment & Plan:   Problem List Items Addressed This Visit      Unprioritized   Essential hypertension    Stable con't meds      Relevant Medications   metoprolol tartrate (LOPRESSOR) 25 MG tablet   losartan-hydrochlorothiazide (HYZAAR) 100-12.5 MG tablet   Other Relevant Orders   Comprehensive metabolic panel (Completed)   Falls frequently    Pt does not want referral at this time May be related to vertigo Check labs Encouraged pt to see neuro-- but she wantss to wait      Hypothyroidism    Check labs con't meds      Relevant Medications   metoprolol tartrate (LOPRESSOR) 25 MG tablet   Other Relevant Orders   TSH (Completed)   Peripheral neuropathy (HCC)    Check labs       Relevant Medications   venlafaxine XR (EFFEXOR-XR) 150 MG 24 hr capsule    Other Visit Diagnoses    Hyperlipidemia, unspecified hyperlipidemia type    -  Primary   Relevant Medications   metoprolol tartrate (LOPRESSOR) 25 MG tablet   losartan-hydrochlorothiazide (HYZAAR) 100-12.5 MG tablet   Other Relevant Orders   Lipid panel  (Completed)   LDL cholesterol, direct (Completed)   Acute bilateral low back pain, with sciatica presence unspecified       Relevant Medications   HYDROcodone-acetaminophen (NORCO/VICODIN) 5-325 MG tablet   Other Relevant Orders   DG Lumbar Spine 2-3 Views   DG Sacrum/Coccyx   Hyperglycemia       Relevant Orders   Hemoglobin A1c (Completed)   Gait disturbance       Relevant Orders   Vitamin B12 (Completed)   Vertigo          I have discontinued Ms. Ruben's metoprolol tartrate and pravastatin. I have also changed her metoprolol tartrate and losartan-hydrochlorothiazide. Additionally, I am having her maintain her calcium carbonate, Multiple Vitamins-Minerals (MULTIVITAMIN PO), fish oil-omega-3 fatty acids, glucosamine-chondroitin, Vitamin D, Cyanocobalamin (VITAMIN B-12 PO), albuterol, lansoprazole, cyclobenzaprine, potassium chloride SA, levothyroxine, gabapentin, venlafaxine XR, and HYDROcodone-acetaminophen.  Meds ordered this encounter  Medications  . venlafaxine XR (EFFEXOR-XR) 150 MG 24 hr capsule    Sig: take 1 capsule by mouth every morning WITH BREAKFAST    Dispense:  90 capsule    Refill:  1  . metoprolol tartrate (LOPRESSOR) 25 MG tablet    Sig: Take 1 tablet (25 mg total) by mouth 2 (two) times daily.    Dispense:  180 tablet    Refill:  1  . losartan-hydrochlorothiazide (HYZAAR) 100-12.5 MG tablet    Sig: Take 1 tablet by mouth daily.    Dispense:  90 tablet    Refill:  1  . HYDROcodone-acetaminophen (NORCO/VICODIN) 5-325 MG tablet    Sig: Take 1 tablet by mouth every 6 (six) hours as needed for moderate pain.  Dispense:  30 tablet    Refill:  0    CMA served as scribe during this visit. History, Physical and Plan performed by medical provider. Documentation and orders reviewed and attested to.  Ann Held, DO

## 2016-03-12 NOTE — Assessment & Plan Note (Signed)
Check labs 

## 2016-03-16 ENCOUNTER — Other Ambulatory Visit: Payer: Self-pay | Admitting: Family Medicine

## 2016-03-16 ENCOUNTER — Telehealth: Payer: Self-pay | Admitting: Family Medicine

## 2016-03-16 DIAGNOSIS — E785 Hyperlipidemia, unspecified: Secondary | ICD-10-CM

## 2016-03-16 MED ORDER — FENOFIBRATE 160 MG PO TABS: 160.0000 mg | ORAL_TABLET | Freq: Every day | ORAL | 2 refills | Status: DC

## 2016-03-16 NOTE — Telephone Encounter (Signed)
Pt returned call for lab results.

## 2016-03-16 NOTE — Telephone Encounter (Signed)
Called the patient see result notes

## 2016-05-21 ENCOUNTER — Other Ambulatory Visit: Payer: Self-pay | Admitting: Family Medicine

## 2016-07-19 ENCOUNTER — Ambulatory Visit (INDEPENDENT_AMBULATORY_CARE_PROVIDER_SITE_OTHER): Payer: Medicare Other | Admitting: Family Medicine

## 2016-07-19 ENCOUNTER — Encounter: Payer: Self-pay | Admitting: Family Medicine

## 2016-07-19 VITALS — BP 126/70 | HR 72 | Temp 98.3°F | Resp 16 | Ht 66.0 in | Wt 188.4 lb

## 2016-07-19 DIAGNOSIS — M545 Low back pain: Secondary | ICD-10-CM | POA: Diagnosis not present

## 2016-07-19 DIAGNOSIS — E039 Hypothyroidism, unspecified: Secondary | ICD-10-CM

## 2016-07-19 DIAGNOSIS — E785 Hyperlipidemia, unspecified: Secondary | ICD-10-CM | POA: Diagnosis not present

## 2016-07-19 DIAGNOSIS — E2839 Other primary ovarian failure: Secondary | ICD-10-CM | POA: Diagnosis not present

## 2016-07-19 DIAGNOSIS — I10 Essential (primary) hypertension: Secondary | ICD-10-CM

## 2016-07-19 DIAGNOSIS — L4050 Arthropathic psoriasis, unspecified: Secondary | ICD-10-CM | POA: Diagnosis not present

## 2016-07-19 LAB — CBC WITH DIFFERENTIAL/PLATELET
BASOS PCT: 1.2 % (ref 0.0–3.0)
Basophils Absolute: 0.1 10*3/uL (ref 0.0–0.1)
EOS PCT: 1.4 % (ref 0.0–5.0)
Eosinophils Absolute: 0.1 10*3/uL (ref 0.0–0.7)
HCT: 39.1 % (ref 36.0–46.0)
Hemoglobin: 13 g/dL (ref 12.0–15.0)
LYMPHS ABS: 2.3 10*3/uL (ref 0.7–4.0)
Lymphocytes Relative: 38 % (ref 12.0–46.0)
MCHC: 33.3 g/dL (ref 30.0–36.0)
MCV: 89.1 fl (ref 78.0–100.0)
MONO ABS: 0.5 10*3/uL (ref 0.1–1.0)
Monocytes Relative: 7.5 % (ref 3.0–12.0)
Neutro Abs: 3.1 10*3/uL (ref 1.4–7.7)
Neutrophils Relative %: 51.9 % (ref 43.0–77.0)
Platelets: 279 10*3/uL (ref 150.0–400.0)
RBC: 4.39 Mil/uL (ref 3.87–5.11)
RDW: 14.4 % (ref 11.5–15.5)
WBC: 6 10*3/uL (ref 4.0–10.5)

## 2016-07-19 LAB — COMPREHENSIVE METABOLIC PANEL
ALBUMIN: 4.3 g/dL (ref 3.5–5.2)
ALK PHOS: 42 U/L (ref 39–117)
ALT: 19 U/L (ref 0–35)
AST: 20 U/L (ref 0–37)
BILIRUBIN TOTAL: 0.3 mg/dL (ref 0.2–1.2)
BUN: 16 mg/dL (ref 6–23)
CO2: 28 mEq/L (ref 19–32)
Calcium: 10.2 mg/dL (ref 8.4–10.5)
Chloride: 100 mEq/L (ref 96–112)
Creatinine, Ser: 1.02 mg/dL (ref 0.40–1.20)
GFR: 57.29 mL/min — AB (ref >60.00)
Glucose, Bld: 98 mg/dL (ref 70–99)
POTASSIUM: 4 meq/L (ref 3.5–5.1)
Sodium: 135 mEq/L (ref 135–145)
TOTAL PROTEIN: 7.1 g/dL (ref 6.0–8.3)

## 2016-07-19 LAB — TSH: TSH: 1.4 u[IU]/mL (ref 0.35–4.50)

## 2016-07-19 LAB — LIPID PANEL
CHOLESTEROL: 317 mg/dL — AB (ref 0–200)
HDL: 38.6 mg/dL — ABNORMAL LOW (ref >39.00)
Total CHOL/HDL Ratio: 8
Triglycerides: 425 mg/dL — ABNORMAL HIGH (ref 0.0–149.0)

## 2016-07-19 LAB — LDL CHOLESTEROL, DIRECT: Direct LDL: 93 mg/dL

## 2016-07-19 MED ORDER — GABAPENTIN 100 MG PO CAPS: 100.0000 mg | ORAL_CAPSULE | Freq: Three times a day (TID) | ORAL | 2 refills | Status: DC

## 2016-07-19 MED ORDER — HYDROCODONE-ACETAMINOPHEN 5-325 MG PO TABS: 1.0000 | ORAL_TABLET | Freq: Four times a day (QID) | ORAL | 0 refills | Status: DC | PRN

## 2016-07-19 MED ORDER — PREDNISONE 10 MG PO TABS: ORAL_TABLET | ORAL | 0 refills | Status: DC

## 2016-07-19 NOTE — Progress Notes (Addendum)
Patient ID: Samantha Hanson, female   DOB: [birth year], 66 y.o.   MRN: [id number]     Subjective:  I acted as a Education administrator for Dr. Carollee Herter.  Guerry Bruin, [place]   Patient ID: Samantha Hanson, female    DOB: 1950/03/09, 66 y.o.   MRN: [id number]  Chief Complaint  Patient presents with  . Arthritis     a lot worse    HPI  Patient is in today for arthritis. She has been having a lot of trouble trying to manage her pain.  Her son is now trying to help her manage.  Her feet has been swollen.  Left shoulder pain for months.  Right lower back sharp pain. Both hands she has painful joints.  It is hard to walk, gives out fast and out of breath.  Has had right hip pain that hurts.  Overall pain is a lot more than moderate.  She seen Dr. Estanislado Pandy back in 2013 when we referred her.  Dr. Estanislado Pandy last step was to start her on Enebrel and at that time it was too expensive and she never went back.   Patient Care Team: Carollee Herter, Alferd Apa, DO as PCP - General   Past Medical History:  Diagnosis Date  . Hyperlipidemia   . Migraine   . MVP (mitral valve prolapse)   . Thyroid disease     Past Surgical History:  Procedure Laterality Date  . BUNIONECTOMY     Right foot    Family History  Problem Relation Age of Onset  . Coronary artery disease Father   . Arthritis Father   . Heart disease Father        heart transplant candidate but too old--per pt  . Thyroid cancer Mother   . Osteoporosis Sister     Social History   Social History  . Marital status: Married    Spouse name: N/A  . Number of children: N/A  . Years of education: N/A   Occupational History  . Not on file.   Social History Main Topics  . Smoking status: Current Every Day Smoker  . Smokeless tobacco: Never Used  . Alcohol use No  . Drug use: No  . Sexual activity: Not Currently   Other Topics Concern  . Not on file   Social History Narrative  . No narrative on file    Outpatient Medications Prior to Visit    Medication Sig Dispense Refill  . albuterol (PROVENTIL HFA;VENTOLIN HFA) 108 (90 BASE) MCG/ACT inhaler Inhale 2 puffs into the lungs every 6 (six) hours as needed. 3 Inhaler 1  . calcium carbonate 1250 MG capsule Take 1,250 mg by mouth daily.    . Cholecalciferol (VITAMIN D) 2000 UNITS CAPS Take by mouth.    . Cyanocobalamin (VITAMIN B-12 PO) Take 1 tablet by mouth daily.    . cyclobenzaprine (FLEXERIL) 10 MG tablet Take 1 tablet (10 mg total) by mouth 3 (three) times daily. 30 tablet 0  . fenofibrate 160 MG tablet Take 1 tablet (160 mg total) by mouth daily. 30 tablet 2  . fish oil-omega-3 fatty acids 1000 MG capsule Take 1 g by mouth daily.     [name] glucosamine-chondroitin 500-400 MG tablet Take 1 tablet by mouth 3 (three) times daily.    [name] levothyroxine (SYNTHROID, LEVOTHROID) 125 MCG tablet take 1 tablet by mouth once daily 90 tablet 3  . losartan-hydrochlorothiazide (HYZAAR) 100-12.5 MG tablet Take 1 tablet by mouth daily. 90 tablet 1  . metoprolol tartrate (LOPRESSOR)  25 MG tablet Take 1 tablet (25 mg total) by mouth 2 (two) times daily. 180 tablet 1  . Multiple Vitamins-Minerals (MULTIVITAMIN PO) Take 1 tablet by mouth daily.    . potassium chloride SA (K-DUR,KLOR-CON) 20 MEQ tablet Take 1 tablet (20 mEq total) by mouth daily. 30 tablet 2  . venlafaxine XR (EFFEXOR-XR) 150 MG 24 hr capsule take 1 capsule by mouth every morning WITH BREAKFAST 90 capsule 1  . gabapentin (NEURONTIN) 100 MG capsule take 1 capsule by mouth at bedtime for 3 days then 1 capsule 90 capsule 1  . HYDROcodone-acetaminophen (NORCO/VICODIN) 5-325 MG tablet Take 1 tablet by mouth every 6 (six) hours as needed for moderate pain. 30 tablet 0  . lansoprazole (PREVACID) 30 MG capsule Take 1 capsule (30 mg total) by mouth daily as needed. 90 capsule 0   No facility-administered medications prior to visit.     No Known Allergies  Review of Systems  Constitutional: Negative for fever and malaise/fatigue.  HENT: Negative  for congestion.   Eyes: Negative for blurred vision.  Respiratory: Negative for cough and shortness of breath.   Cardiovascular: Negative for chest pain, palpitations and leg swelling.  Gastrointestinal: Negative for vomiting.  Musculoskeletal: Positive for back pain and joint pain.       Left shoulder, both hands, right hip pan  Skin: Negative for rash.  Neurological: Negative for loss of consciousness and headaches.       Objective:    Physical Exam  Constitutional: She is oriented to person, place, and time. She appears well-developed and well-nourished.  HENT:  Head: Normocephalic and atraumatic.  Eyes: Conjunctivae and EOM are normal.  Neck: Normal range of motion. Neck supple. No JVD present. Carotid bruit is not present. No thyromegaly present.  Cardiovascular: Normal rate, regular rhythm and normal heart sounds.   No murmur heard. Pulmonary/Chest: Effort normal and breath sounds normal. No respiratory distress. She has no wheezes. She has no rales. She exhibits no tenderness.  Musculoskeletal: She exhibits edema, tenderness and deformity.       Right shoulder: She exhibits decreased range of motion, tenderness, swelling, deformity and pain.       Left shoulder: She exhibits decreased range of motion, tenderness and pain.       Right hip: She exhibits decreased range of motion, decreased strength and tenderness.       Left hip: She exhibits decreased range of motion, decreased strength and tenderness.       Right knee: She exhibits decreased range of motion. Tenderness found.       Left knee: She exhibits decreased range of motion and swelling. Tenderness found.       Right hand: She exhibits decreased range of motion, tenderness, bony tenderness and deformity.       Left hand: She exhibits decreased range of motion, tenderness, bony tenderness, deformity and swelling.  Neurological: She is alert and oriented to person, place, and time.  Psychiatric: She has a normal mood and  affect.  Nursing note and vitals reviewed.   BP 126/70 (BP Location: Left Arm, Cuff Size: Large)   Pulse 72   Temp 98.3 F (36.8 C) (Oral)   Resp 16   Ht 5\' 6"  (1.676 m)   Wt 188 lb 6.4 oz (85.5 kg)   SpO2 96%   BMI 30.41 kg/m  Wt Readings from Last 3 Encounters:  07/19/16 188 lb 6.4 oz (85.5 kg)  03/12/16 191 lb 12.8 oz (87 kg)  07/01/15 190 lb  12.8 oz (86.5 kg)   BP Readings from Last 3 Encounters:  07/19/16 126/70  03/12/16 116/70  07/01/15 132/78     Immunization History  Administered Date(s) Administered  . Influenza, High Dose Seasonal PF 12/08/2013, 11/27/2014  . Influenza,inj,Quad PF,36+ Mos 12/06/2012  . Influenza-Unspecified 10/10/2015  . Pneumococcal Conjugate-13 08/06/2014  . Td 08/19/2004  . Zoster 08/27/2010    Health Maintenance  Topic Date Due  . Hepatitis C Screening  1950/08/11  . COLONOSCOPY  08/07/1998  . MAMMOGRAM  03/05/2012  . TETANUS/TDAP  08/20/2014  . PNA vac Low Risk Adult (2 of 2 - PPSV23) 08/06/2015  . INFLUENZA VACCINE  09/08/2016  . DEXA SCAN  Completed    Lab Results  Component Value Date   WBC 6.0 07/19/2016   HGB 13.0 07/19/2016   HCT 39.1 07/19/2016   PLT 279.0 07/19/2016   GLUCOSE 98 07/19/2016   CHOL 317 (H) 07/19/2016   TRIG (H) 07/19/2016    425.0 Triglyceride is over 400; calculations on Lipids are invalid.   HDL 38.60 (L) 07/19/2016   LDLDIRECT 93.0 07/19/2016   LDLCALC 262 (H) 06/05/2013   ALT 19 07/19/2016   AST 20 07/19/2016   NA 135 07/19/2016   K 4.0 07/19/2016   CL 100 07/19/2016   CREATININE 1.02 07/19/2016   BUN 16 07/19/2016   CO2 28 07/19/2016   TSH 1.40 07/19/2016   HGBA1C 6.4 03/12/2016   MICROALBUR 0.8 07/13/2012    Lab Results  Component Value Date   TSH 1.40 07/19/2016   Lab Results  Component Value Date   WBC 6.0 07/19/2016   HGB 13.0 07/19/2016   HCT 39.1 07/19/2016   MCV 89.1 07/19/2016   PLT 279.0 07/19/2016   Lab Results  Component Value Date   NA 135 07/19/2016   K 4.0  07/19/2016   CO2 28 07/19/2016   GLUCOSE 98 07/19/2016   BUN 16 07/19/2016   CREATININE 1.02 07/19/2016   BILITOT 0.3 07/19/2016   ALKPHOS 42 07/19/2016   AST 20 07/19/2016   ALT 19 07/19/2016   PROT 7.1 07/19/2016   ALBUMIN 4.3 07/19/2016   CALCIUM 10.2 07/19/2016   GFR 57.29 (L) 07/19/2016   Lab Results  Component Value Date   CHOL 317 (H) 07/19/2016   Lab Results  Component Value Date   HDL 38.60 (L) 07/19/2016   Lab Results  Component Value Date   LDLCALC 262 (H) 06/05/2013   Lab Results  Component Value Date   TRIG (H) 07/19/2016    425.0 Triglyceride is over 400; calculations on Lipids are invalid.   Lab Results  Component Value Date   CHOLHDL 8 07/19/2016   Lab Results  Component Value Date   HGBA1C 6.4 03/12/2016         Assessment & Plan:   Problem List Items Addressed This Visit      Unprioritized   Essential hypertension    Well controlled, no changes to meds. Encouraged heart healthy diet such as the DASH diet and exercise as tolerated.       Relevant Orders   TSH (Completed)   CBC with Differential/Platelet (Completed)   Lipid panel (Completed)   Comprehensive metabolic panel (Completed)   Hyperlipidemia    Tolerating statin, encouraged heart healthy diet, avoid trans fats, minimize simple carbs and saturated fats. Increase exercise as tolerated      Relevant Orders   TSH (Completed)   CBC with Differential/Platelet (Completed)   Lipid panel (Completed)   Comprehensive  metabolic panel (Completed)   LDL cholesterol, direct (Completed)   Hypothyroidism    con't synthroid Check labs      Relevant Orders   TSH (Completed)    Other Visit Diagnoses    Psoriatic arthritis ([place])    -  Primary   Relevant Medications   gabapentin (NEURONTIN) 100 MG capsule   predniSONE (DELTASONE) 10 MG tablet   Other Relevant Orders   Ambulatory referral to Rheumatology   Acute bilateral low back pain, with sciatica presence unspecified        Relevant Medications   predniSONE (DELTASONE) 10 MG tablet   HYDROcodone-acetaminophen (NORCO/VICODIN) 5-325 MG tablet   Estrogen deficiency       Relevant Orders   DG Bone Density      I have discontinued Ms. Stancil's gabapentin. I am also having her start on gabapentin and predniSONE. Additionally, I am having her maintain her calcium carbonate, Multiple Vitamins-Minerals (MULTIVITAMIN PO), fish oil-omega-3 fatty acids, glucosamine-chondroitin, Vitamin D, Cyanocobalamin (VITAMIN B-12 PO), albuterol, lansoprazole, cyclobenzaprine, potassium chloride SA, levothyroxine, venlafaxine XR, metoprolol tartrate, losartan-hydrochlorothiazide, fenofibrate, and HYDROcodone-acetaminophen.  Meds ordered this encounter  Medications  . gabapentin (NEURONTIN) 100 MG capsule    Sig: Take 1 capsule (100 mg total) by mouth 3 (three) times daily.    Dispense:  90 capsule    Refill:  2  . predniSONE (DELTASONE) 10 MG tablet    Sig: TAKE 3 TABLETS PO QD FOR 3 DAYS THEN TAKE 2 TABLETS PO QD FOR 3 DAYS THEN TAKE 1 TABLET PO QD FOR 3 DAYS THEN TAKE 1/2 TAB PO QD FOR 3 DAYS    Dispense:  20 tablet    Refill:  0  . HYDROcodone-acetaminophen (NORCO/VICODIN) 5-325 MG tablet    Sig: Take 1 tablet by mouth every 6 (six) hours as needed for moderate pain.    Dispense:  30 tablet    Refill:  0    CMA served as scribe during this visit. History, Physical and Plan performed by medical provider. Documentation and orders reviewed and attested to.  Ann Held, DO

## 2016-07-19 NOTE — Patient Instructions (Signed)

## 2016-07-21 NOTE — Assessment & Plan Note (Signed)
Tolerating statin, encouraged heart healthy diet, avoid trans fats, minimize simple carbs and saturated fats. Increase exercise as tolerated 

## 2016-07-21 NOTE — Assessment & Plan Note (Signed)
Well controlled, no changes to meds. Encouraged heart healthy diet such as the DASH diet and exercise as tolerated.  °

## 2016-07-21 NOTE — Assessment & Plan Note (Signed)
con't synthroid Check labs  

## 2016-08-12 ENCOUNTER — Encounter: Payer: Self-pay | Admitting: Family Medicine

## 2016-09-17 ENCOUNTER — Other Ambulatory Visit: Payer: Self-pay | Admitting: Family Medicine

## 2016-09-28 ENCOUNTER — Other Ambulatory Visit: Payer: Self-pay | Admitting: Family Medicine

## 2016-09-28 DIAGNOSIS — E039 Hypothyroidism, unspecified: Secondary | ICD-10-CM

## 2016-09-28 NOTE — Telephone Encounter (Signed)
Faxed refill of Levothyroxine/thx dmf

## 2016-10-26 DIAGNOSIS — G8929 Other chronic pain: Secondary | ICD-10-CM | POA: Diagnosis not present

## 2016-10-26 DIAGNOSIS — M2550 Pain in unspecified joint: Secondary | ICD-10-CM | POA: Diagnosis not present

## 2016-10-26 DIAGNOSIS — M79642 Pain in left hand: Secondary | ICD-10-CM | POA: Diagnosis not present

## 2016-10-26 DIAGNOSIS — M545 Low back pain: Secondary | ICD-10-CM | POA: Diagnosis not present

## 2016-10-26 DIAGNOSIS — Z6830 Body mass index (BMI) 30.0-30.9, adult: Secondary | ICD-10-CM | POA: Diagnosis not present

## 2016-10-26 DIAGNOSIS — M79641 Pain in right hand: Secondary | ICD-10-CM | POA: Diagnosis not present

## 2016-10-26 DIAGNOSIS — E669 Obesity, unspecified: Secondary | ICD-10-CM | POA: Diagnosis not present

## 2016-11-03 ENCOUNTER — Other Ambulatory Visit: Payer: Self-pay | Admitting: Family Medicine

## 2016-11-26 ENCOUNTER — Other Ambulatory Visit: Payer: Self-pay | Admitting: Family Medicine

## 2016-11-26 DIAGNOSIS — I10 Essential (primary) hypertension: Secondary | ICD-10-CM

## 2016-11-30 NOTE — Telephone Encounter (Signed)
Pt called in to follow up on refill request. Pt says that she is now completely out now. Original request was sent on Sunday 11/26/16.    Please assist and advise further.

## 2016-12-01 ENCOUNTER — Other Ambulatory Visit: Payer: Self-pay

## 2016-12-01 DIAGNOSIS — M545 Low back pain: Secondary | ICD-10-CM

## 2016-12-01 MED ORDER — HYDROCODONE-ACETAMINOPHEN 5-325 MG PO TABS: 1.0000 | ORAL_TABLET | Freq: Four times a day (QID) | ORAL | 0 refills | Status: DC | PRN

## 2016-12-02 DIAGNOSIS — Z23 Encounter for immunization: Secondary | ICD-10-CM | POA: Diagnosis not present

## 2016-12-07 ENCOUNTER — Encounter: Payer: Self-pay | Admitting: *Deleted

## 2016-12-07 ENCOUNTER — Telehealth: Payer: Self-pay | Admitting: Family Medicine

## 2016-12-07 ENCOUNTER — Other Ambulatory Visit: Payer: Self-pay | Admitting: *Deleted

## 2016-12-07 DIAGNOSIS — Z79899 Other long term (current) drug therapy: Secondary | ICD-10-CM

## 2016-12-07 NOTE — Telephone Encounter (Signed)
rx placed up front she need to complete contract and UDS.  Patient notified

## 2016-12-07 NOTE — Telephone Encounter (Signed)
Relation to LJ:QGBE Call back number:[phone] Pharmacy: [facility], [place] [address] [phone] (Phone) [phone] (Fax)     Reason for call:  Patient requesting HYDROcodone-acetaminophen (NORCO/VICODIN) 5-325 MG tablet

## 2016-12-14 ENCOUNTER — Other Ambulatory Visit: Payer: Medicare Other

## 2016-12-14 DIAGNOSIS — Z79899 Other long term (current) drug therapy: Secondary | ICD-10-CM | POA: Diagnosis not present

## 2016-12-16 LAB — PAIN MGMT, PROFILE 8 W/CONF, U
6 Acetylmorphine: NEGATIVE ng/mL (ref <10)
ALCOHOL METABOLITES: NEGATIVE ng/mL (ref <500)
Amphetamines: NEGATIVE ng/mL (ref <500)
BENZODIAZEPINES: NEGATIVE ng/mL (ref <100)
Buprenorphine, Urine: NEGATIVE ng/mL (ref <5)
CREATININE: 140.5 mg/dL
Cocaine Metabolite: NEGATIVE ng/mL (ref <150)
MARIJUANA METABOLITE: NEGATIVE ng/mL (ref <20)
MDA: NEGATIVE ng/mL (ref <200)
MDMA: NEGATIVE ng/mL (ref <200)
MDMA: NEGATIVE ng/mL (ref <500)
Opiates: NEGATIVE ng/mL (ref <100)
Oxidant: NEGATIVE ug/mL (ref <200)
Oxycodone: NEGATIVE ng/mL (ref <100)
PH: 6.84 (ref 4.5–9.0)

## 2016-12-20 ENCOUNTER — Other Ambulatory Visit: Payer: Self-pay | Admitting: Family Medicine

## 2016-12-20 DIAGNOSIS — I10 Essential (primary) hypertension: Secondary | ICD-10-CM

## 2017-02-23 ENCOUNTER — Telehealth: Payer: Self-pay | Admitting: Family Medicine

## 2017-02-23 NOTE — Telephone Encounter (Signed)
Copied from [place] [phone]. Topic: General - Other >> Feb 23, 2017 10:13 AM Malena Catholic I, NT wrote: Reason for IDU:PBDH she have a new insurance  card it [employer] [id number]

## 2017-02-23 NOTE — Telephone Encounter (Signed)
Noted  

## 2017-02-25 ENCOUNTER — Other Ambulatory Visit: Payer: Self-pay | Admitting: Emergency Medicine

## 2017-02-25 DIAGNOSIS — E039 Hypothyroidism, unspecified: Secondary | ICD-10-CM

## 2017-02-25 DIAGNOSIS — I10 Essential (primary) hypertension: Secondary | ICD-10-CM

## 2017-03-02 ENCOUNTER — Other Ambulatory Visit: Payer: Self-pay | Admitting: *Deleted

## 2017-03-02 DIAGNOSIS — I10 Essential (primary) hypertension: Secondary | ICD-10-CM

## 2017-03-02 DIAGNOSIS — E039 Hypothyroidism, unspecified: Secondary | ICD-10-CM

## 2017-03-02 MED ORDER — FENOFIBRATE 160 MG PO TABS: ORAL_TABLET | ORAL | 0 refills | Status: DC

## 2017-03-02 MED ORDER — LOSARTAN POTASSIUM-HCTZ 100-12.5 MG PO TABS: 1.0000 | ORAL_TABLET | Freq: Every day | ORAL | 0 refills | Status: DC

## 2017-03-02 MED ORDER — LEVOTHYROXINE SODIUM 125 MCG PO TABS: 125.0000 ug | ORAL_TABLET | Freq: Every day | ORAL | 0 refills | Status: DC

## 2017-03-03 ENCOUNTER — Telehealth: Payer: Self-pay | Admitting: Family Medicine

## 2017-03-03 DIAGNOSIS — I10 Essential (primary) hypertension: Secondary | ICD-10-CM

## 2017-03-03 DIAGNOSIS — L4050 Arthropathic psoriasis, unspecified: Secondary | ICD-10-CM

## 2017-03-03 NOTE — Telephone Encounter (Signed)
Copied from [place] [id number]. Topic: Quick Communication - See Telephone Encounter >> Mar 03, 2017  1:31 PM Samantha Hanson wrote: CRM for notification. See Telephone encounter for:   Samantha Hanson [phone]   [facility] Faxed request on 02/23/17  regarding: new prescription is needed:   Samantha Hanson has not filled these before   Fenofibrate take 1 tablet by mouth once daily with food    Levothyroxine Sodium 125 mcg Oral Daily    Losartan Potassium-HCTZ 1 tablet Oral Daily  [place], [address] [place] [address] Phone: [phone] Fax: [phone]    03/03/17.

## 2017-03-03 NOTE — Telephone Encounter (Signed)
Reached out to pharmacy and Rx were sent out this morning

## 2017-03-03 NOTE — Telephone Encounter (Signed)
1 month supply has been sent to pharmacy- 03/02/2017

## 2017-03-08 MED ORDER — VENLAFAXINE HCL ER 150 MG PO CP24: ORAL_CAPSULE | ORAL | 1 refills | Status: DC

## 2017-03-08 MED ORDER — METOPROLOL TARTRATE 25 MG PO TABS: 25.0000 mg | ORAL_TABLET | Freq: Two times a day (BID) | ORAL | 1 refills | Status: DC

## 2017-03-08 NOTE — Telephone Encounter (Signed)
Do you want to continue on the gabapentin?

## 2017-03-08 NOTE — Telephone Encounter (Signed)
VENLAFAXINE , GABAPENTIN, metoprolol tartrate (LOPRESSOR) 25 MG  refill Last OV: 07/19/16; noted for f/u in 6 months, but no appointment made. This is when gabapentin was started. Last Refill: Gabapentin 07/19/16, 180 tabs/2 refills Pharmacy: [facility] ([address] [facility] patient)  I filled the metoprolol and venlafaxine. I was unsure about gabapentin, since it was started 07/19/16.

## 2017-03-08 NOTE — Telephone Encounter (Signed)
Ok to refill x 6 months 

## 2017-03-08 NOTE — Telephone Encounter (Signed)
Copied from [place] [id number]. Topic: Quick Communication - Rx Refill/Question >> Mar 08, 2017  9:17 AM Carolyn Stare wrote: Medication   VENLAFAXINE , GABAPENTIN, metoprolol tartrate (LOPRESSOR) 25 MG tablet   Has the patient contacted their pharmacy  yes    Preferred Pharmacy  [facility]    Agent: Please be advised that RX refills may take up to 3 business days. We ask that you follow-up with your pharmacy.

## 2017-03-09 MED ORDER — GABAPENTIN 100 MG PO CAPS: 100.0000 mg | ORAL_CAPSULE | Freq: Three times a day (TID) | ORAL | 1 refills | Status: DC

## 2017-03-09 NOTE — Addendum Note (Signed)
Addended by: Kem Boroughs D on: 03/09/2017 08:17 AM   Modules accepted: Orders

## 2017-03-09 NOTE — Telephone Encounter (Signed)
Left detailed message on machine that rxs was sent in.

## 2017-03-18 ENCOUNTER — Other Ambulatory Visit: Payer: Self-pay | Admitting: Family Medicine

## 2017-03-18 ENCOUNTER — Other Ambulatory Visit: Payer: Self-pay

## 2017-03-18 DIAGNOSIS — M545 Low back pain: Secondary | ICD-10-CM

## 2017-03-18 MED ORDER — HYDROCODONE-ACETAMINOPHEN 5-325 MG PO TABS: 1.0000 | ORAL_TABLET | Freq: Four times a day (QID) | ORAL | 0 refills | Status: DC | PRN

## 2017-03-18 NOTE — Progress Notes (Signed)
Database checked today Med refilled

## 2017-05-23 ENCOUNTER — Other Ambulatory Visit: Payer: Self-pay | Admitting: Family Medicine

## 2017-05-23 DIAGNOSIS — I10 Essential (primary) hypertension: Secondary | ICD-10-CM

## 2017-06-10 ENCOUNTER — Other Ambulatory Visit: Payer: Self-pay | Admitting: Family Medicine

## 2017-06-10 DIAGNOSIS — M545 Low back pain: Secondary | ICD-10-CM

## 2017-06-10 DIAGNOSIS — E039 Hypothyroidism, unspecified: Secondary | ICD-10-CM

## 2017-06-10 NOTE — Telephone Encounter (Signed)
Copied from [place] [phone]. Topic: Quick Communication - Rx Refill/Question >> Jun 10, 2017  8:37 AM Synthia Innocent wrote: Medication: HYDROcodone-acetaminophen (NORCO/VICODIN) 5-325 MG tablet  Has the patient contacted their pharmacy? Yes.   (Agent: If no, request that the patient contact the pharmacy for the refill.) Preferred Pharmacy (with phone number or street name): [facility] on [address] Agent: Please be advised that RX refills may take up to 3 business days. We ask that you follow-up with your pharmacy.  Requesting call once sent

## 2017-06-10 NOTE — Telephone Encounter (Signed)
Rx refill request: hydrocodone acetaminophen 5-325 mg  Last filled: 03/18/17 #30  LOV: 07/19/16  PCP: [facility]  Pharmacy: verified

## 2017-06-14 ENCOUNTER — Telehealth: Payer: Self-pay | Admitting: Family Medicine

## 2017-06-14 NOTE — Telephone Encounter (Signed)
Author phoned pt. at home number To communicate need to make appointment for follow-up per 6/11 note from Dr. Etter Sjogren and UDS need before Dr. Etter Sjogren can refill narcotic. Pt. Unable to be reached, so mychart message sent. Dr. Etter Sjogren made aware.   Requesting: Hydrocodone-Acetaminophen 5-325mg  po q 6hrs prn Contract: yes UDS: 12/14/16 Last OV: 07/19/16 Next Ov: N/A Last refill: 03/18/17 Database:

## 2017-06-14 NOTE — Telephone Encounter (Signed)
Requested refill on Hydrocodone- Acetaminophen; last refill 03/18/17; # 30; no refills Last office visit 07/19/16 PCP Dr. Carollee Herter [facility]acy: [facility] [address].   Has pended order on Hydrocodone-Acetaminophen

## 2017-06-14 NOTE — Telephone Encounter (Unsigned)
Copied from [place] [id number]. Topic: Quick Communication - Rx Refill/Question >> Jun 10, 2017  8:37 AM Synthia Innocent wrote: Medication: HYDROcodone-acetaminophen (NORCO/VICODIN) 5-325 MG tablet  Has the patient contacted their pharmacy? Yes.   (Agent: If no, request that the patient contact the pharmacy for the refill.) Preferred Pharmacy (with phone number or street name): [facility] on [address] Agent: Please be advised that RX refills may take up to 3 business days. We ask that you follow-up with your pharmacy.

## 2017-06-14 NOTE — Telephone Encounter (Signed)
agree

## 2017-06-16 ENCOUNTER — Ambulatory Visit: Payer: Medicare Other | Admitting: Family Medicine

## 2017-06-18 ENCOUNTER — Encounter: Payer: Self-pay | Admitting: Family Medicine

## 2017-06-20 ENCOUNTER — Ambulatory Visit (HOSPITAL_BASED_OUTPATIENT_CLINIC_OR_DEPARTMENT_OTHER)
Admission: RE | Admit: 2017-06-20 | Discharge: 2017-06-20 | Disposition: A | Discharge status: Home / Self Care | Payer: Medicare HMO | Source: Ambulatory Visit | Attending: Family Medicine | Admitting: Family Medicine

## 2017-06-20 ENCOUNTER — Encounter: Payer: Self-pay | Admitting: Family Medicine

## 2017-06-20 ENCOUNTER — Ambulatory Visit (INDEPENDENT_AMBULATORY_CARE_PROVIDER_SITE_OTHER): Payer: Medicare HMO | Admitting: Family Medicine

## 2017-06-20 VITALS — BP 122/72 | HR 81 | Temp 98.5°F | Resp 16 | Ht 66.0 in | Wt 195.8 lb

## 2017-06-20 DIAGNOSIS — E785 Hyperlipidemia, unspecified: Secondary | ICD-10-CM | POA: Diagnosis not present

## 2017-06-20 DIAGNOSIS — G8929 Other chronic pain: Secondary | ICD-10-CM

## 2017-06-20 DIAGNOSIS — E039 Hypothyroidism, unspecified: Secondary | ICD-10-CM | POA: Diagnosis not present

## 2017-06-20 DIAGNOSIS — M5441 Lumbago with sciatica, right side: Secondary | ICD-10-CM

## 2017-06-20 DIAGNOSIS — J441 Chronic obstructive pulmonary disease with (acute) exacerbation: Secondary | ICD-10-CM

## 2017-06-20 DIAGNOSIS — L4050 Arthropathic psoriasis, unspecified: Secondary | ICD-10-CM

## 2017-06-20 DIAGNOSIS — I700 Atherosclerosis of aorta: Secondary | ICD-10-CM | POA: Insufficient documentation

## 2017-06-20 DIAGNOSIS — I10 Essential (primary) hypertension: Secondary | ICD-10-CM

## 2017-06-20 DIAGNOSIS — M545 Low back pain: Secondary | ICD-10-CM | POA: Diagnosis not present

## 2017-06-20 DIAGNOSIS — F320 Major depressive disorder, single episode, mild: Secondary | ICD-10-CM

## 2017-06-20 DIAGNOSIS — R059 Cough, unspecified: Secondary | ICD-10-CM

## 2017-06-20 DIAGNOSIS — J9801 Acute bronchospasm: Secondary | ICD-10-CM

## 2017-06-20 DIAGNOSIS — F411 Generalized anxiety disorder: Secondary | ICD-10-CM

## 2017-06-20 DIAGNOSIS — R05 Cough: Secondary | ICD-10-CM | POA: Insufficient documentation

## 2017-06-20 DIAGNOSIS — E038 Other specified hypothyroidism: Secondary | ICD-10-CM | POA: Diagnosis not present

## 2017-06-20 DIAGNOSIS — F17200 Nicotine dependence, unspecified, uncomplicated: Secondary | ICD-10-CM

## 2017-06-20 DIAGNOSIS — M5136 Other intervertebral disc degeneration, lumbar region: Secondary | ICD-10-CM | POA: Insufficient documentation

## 2017-06-20 DIAGNOSIS — G6289 Other specified polyneuropathies: Secondary | ICD-10-CM

## 2017-06-20 DIAGNOSIS — E538 Deficiency of other specified B group vitamins: Secondary | ICD-10-CM

## 2017-06-20 MED ORDER — CYCLOBENZAPRINE HCL 10 MG PO TABS: 10.0000 mg | ORAL_TABLET | Freq: Three times a day (TID) | ORAL | 0 refills | Status: DC

## 2017-06-20 MED ORDER — VENLAFAXINE HCL ER 150 MG PO CP24: ORAL_CAPSULE | ORAL | 3 refills | Status: DC

## 2017-06-20 MED ORDER — FLUTICASONE PROPIONATE HFA 110 MCG/ACT IN AERO: 2.0000 | INHALATION_SPRAY | Freq: Two times a day (BID) | RESPIRATORY_TRACT | 12 refills | Status: DC

## 2017-06-20 MED ORDER — LEVOTHYROXINE SODIUM 125 MCG PO TABS: 125.0000 ug | ORAL_TABLET | Freq: Every day | ORAL | 3 refills | Status: DC

## 2017-06-20 MED ORDER — FENOFIBRATE 160 MG PO TABS: ORAL_TABLET | ORAL | 1 refills | Status: DC

## 2017-06-20 MED ORDER — POTASSIUM CHLORIDE CRYS ER 20 MEQ PO TBCR: 20.0000 meq | EXTENDED_RELEASE_TABLET | Freq: Every day | ORAL | 2 refills | Status: DC

## 2017-06-20 MED ORDER — GABAPENTIN 100 MG PO CAPS: 100.0000 mg | ORAL_CAPSULE | Freq: Three times a day (TID) | ORAL | 1 refills | Status: DC

## 2017-06-20 MED ORDER — HYDROCODONE-ACETAMINOPHEN 5-325 MG PO TABS: 1.0000 | ORAL_TABLET | Freq: Four times a day (QID) | ORAL | 0 refills | Status: DC | PRN

## 2017-06-20 MED ORDER — IPRATROPIUM-ALBUTEROL 0.5-2.5 (3) MG/3ML IN SOLN
3.0000 mL | Freq: Once | RESPIRATORY_TRACT | Status: AC
  Administered 2017-06-20: 3 mL via RESPIRATORY_TRACT

## 2017-06-20 MED ORDER — METOPROLOL TARTRATE 25 MG PO TABS: 25.0000 mg | ORAL_TABLET | Freq: Two times a day (BID) | ORAL | 1 refills | Status: DC

## 2017-06-20 MED ORDER — ALBUTEROL SULFATE HFA 108 (90 BASE) MCG/ACT IN AERS: 2.0000 | INHALATION_SPRAY | Freq: Four times a day (QID) | RESPIRATORY_TRACT | 1 refills | Status: DC | PRN

## 2017-06-20 NOTE — Telephone Encounter (Signed)
If her pharmacy did not tell her hers was on back order -- its ok to keep taking

## 2017-06-20 NOTE — Progress Notes (Signed)
/Patient ID: Samantha Hanson, female    DOB: [birth year]  Age: 67 y.o. MRN: [id number]    Subjective:  Subjective  HPI Samantha Hanson presents for med f/u and she c/o back pain that is chronic Review of Systems  Constitutional: Negative for appetite change, diaphoresis, fatigue and unexpected weight change.  Eyes: Negative for pain, redness and visual disturbance.  Respiratory: Negative for cough, chest tightness, shortness of breath and wheezing.   Cardiovascular: Negative for chest pain, palpitations and leg swelling.  Endocrine: Negative for cold intolerance, heat intolerance, polydipsia, polyphagia and polyuria.  Genitourinary: Negative for difficulty urinating, dysuria and frequency.  Musculoskeletal: Positive for back pain. Negative for gait problem and joint swelling.  Neurological: Negative for dizziness, light-headedness, numbness and headaches.  Psychiatric/Behavioral: Positive for decreased concentration, dysphoric mood and sleep disturbance. Negative for agitation, behavioral problems, hallucinations, self-injury and suicidal ideas. The patient is nervous/anxious. The patient is not hyperactive.     History Past Medical History:  Diagnosis Date  . Hyperlipidemia   . Migraine   . MVP (mitral valve prolapse)   . Thyroid disease     She has a past surgical history that includes Bunionectomy.   Her family history includes Arthritis in her father; Coronary artery disease in her father; Heart disease in her father; Osteoporosis in her sister; Thyroid cancer in her mother.She reports that she has been smoking.  She has a 75.00 pack-year smoking history. She has never used smokeless tobacco. She reports that she does not drink alcohol or use drugs.  Current Outpatient Medications on File Prior to Visit  Medication Sig Dispense Refill  . calcium carbonate 1250 MG capsule Take 1,250 mg by mouth daily.    . Cholecalciferol (VITAMIN D) 2000 UNITS CAPS Take by mouth.    .  Cyanocobalamin (B-12 PO) Take 1 tablet by mouth daily.    . fish oil-omega-3 fatty acids 1000 MG capsule Take 1 g by mouth daily.     [name] glucosamine-chondroitin 500-400 MG tablet Take 1 tablet by mouth 3 (three) times daily.    . lansoprazole (PREVACID) 30 MG capsule Take 1 capsule (30 mg total) by mouth daily as needed. 90 capsule 0  . losartan-hydrochlorothiazide (HYZAAR) 100-12.5 MG tablet TAKE 1 TABLET EVERY DAY 90 tablet 0  . Multiple Vitamins-Minerals (MULTIVITAMIN PO) Take 1 tablet by mouth daily.     No current facility-administered medications on file prior to visit.      Objective:  Objective  Physical Exam  Constitutional: She is oriented to person, place, and time. She appears well-developed and well-nourished.  HENT:  Head: Normocephalic and atraumatic.  Eyes: Conjunctivae and EOM are normal.  Neck: Normal range of motion. Neck supple. No JVD present. Carotid bruit is not present. No thyromegaly present.  Cardiovascular: Normal rate, regular rhythm and normal heart sounds.  No murmur heard. Pulmonary/Chest: Effort normal and breath sounds normal. No respiratory distress. She has no wheezes. She has no rales. She exhibits no tenderness.  Musculoskeletal: She exhibits tenderness. She exhibits no edema.       Lumbar back: She exhibits decreased range of motion.  Neurological: She is alert and oriented to person, place, and time.  Psychiatric: She has a normal mood and affect.  Nursing note and vitals reviewed.  BP 122/72 (BP Location: Left Arm, Cuff Size: Large)   Pulse 81   Temp 98.5 F (36.9 C) (Oral)   Resp 16   Ht 5\' 6"  (1.676 m)   Wt 195 lb 12.8 oz (88.8  kg)   SpO2 98%   BMI 31.60 kg/m  Wt Readings from Last 3 Encounters:  06/20/17 195 lb 12.8 oz (88.8 kg)  07/19/16 188 lb 6.4 oz (85.5 kg)  03/12/16 191 lb 12.8 oz (87 kg)     Lab Results  Component Value Date   WBC 6.0 07/19/2016   HGB 13.0 07/19/2016   HCT 39.1 07/19/2016   PLT 279.0 07/19/2016    GLUCOSE 114 (H) 06/20/2017   CHOL 394 (H) 06/20/2017   TRIG (H) 06/20/2017    885.0 Triglyceride is over 400; calculations on Lipids are invalid.   HDL 34.00 (L) 06/20/2017   LDLDIRECT 82.0 06/20/2017   LDLCALC 262 (H) 06/05/2013   ALT 20 06/20/2017   AST 13 06/20/2017   NA 134 (L) 06/20/2017   K 3.8 06/20/2017   CL 97 06/20/2017   CREATININE 0.96 06/20/2017   BUN 16 06/20/2017   CO2 26 06/20/2017   TSH 1.56 06/20/2017   HGBA1C 6.4 03/12/2016   MICROALBUR 0.8 07/13/2012    Dg Chest 2 View  Result Date: 06/05/2013 CLINICAL DATA:  Cough, congestion, shortness of breath EXAM: CHEST  2 VIEW COMPARISON:  None. FINDINGS: Lungs are clear.  No pleural effusion or pneumothorax. The heart is normal in size. Mild degenerative changes of the visualized thoracolumbar spine. IMPRESSION: No evidence of acute cardiopulmonary disease. Electronically Signed   By: Julian Hy M.D.   On: 06/05/2013 14:43     Assessment & Plan:  Plan  I have discontinued Samantha Hanson's Cyanocobalamin (VITAMIN B-12 PO) and predniSONE. I have also changed her levothyroxine. Additionally, I am having her start on fluticasone. Lastly, I am having her maintain her calcium carbonate, Multiple Vitamins-Minerals (MULTIVITAMIN PO), fish oil-omega-3 fatty acids, glucosamine-chondroitin, Vitamin D, lansoprazole, losartan-hydrochlorothiazide, Cyanocobalamin (B-12 PO), HYDROcodone-acetaminophen, albuterol, fenofibrate, cyclobenzaprine, gabapentin, metoprolol tartrate, potassium chloride SA, and venlafaxine XR. We administered ipratropium-albuterol.  Meds ordered this encounter  Medications  . HYDROcodone-acetaminophen (NORCO/VICODIN) 5-325 MG tablet    Sig: Take 1 tablet by mouth every 6 (six) hours as needed for moderate pain.    Dispense:  30 tablet    Refill:  0  . albuterol (PROVENTIL HFA;VENTOLIN HFA) 108 (90 Base) MCG/ACT inhaler    Sig: Inhale 2 puffs into the lungs every 6 (six) hours as needed.    Dispense:  3  Inhaler    Refill:  1  . fenofibrate 160 MG tablet    Sig: take 1 tablet by mouth once daily with food    Dispense:  90 tablet    Refill:  1    Plz advise needs appt for Dec  . cyclobenzaprine (FLEXERIL) 10 MG tablet    Sig: Take 1 tablet (10 mg total) by mouth 3 (three) times daily.    Dispense:  30 tablet    Refill:  0  . gabapentin (NEURONTIN) 100 MG capsule    Sig: Take 1 capsule (100 mg total) by mouth 3 (three) times daily.    Dispense:  270 capsule    Refill:  1  . levothyroxine (SYNTHROID, LEVOTHROID) 125 MCG tablet    Sig: Take 1 tablet (125 mcg total) by mouth daily.    Dispense:  90 tablet    Refill:  3  . metoprolol tartrate (LOPRESSOR) 25 MG tablet    Sig: Take 1 tablet (25 mg total) by mouth 2 (two) times daily.    Dispense:  180 tablet    Refill:  1  . potassium chloride SA (K-DUR,KLOR-CON) 20  MEQ tablet    Sig: Take 1 tablet (20 mEq total) by mouth daily.    Dispense:  30 tablet    Refill:  2  . venlafaxine XR (EFFEXOR-XR) 150 MG 24 hr capsule    Sig: take 1 capsule by mouth every morning WITH BREAKFAST    Dispense:  90 capsule    Refill:  3  . ipratropium-albuterol (DUONEB) 0.5-2.5 (3) MG/3ML nebulizer solution 3 mL  . fluticasone (FLOVENT HFA) 110 MCG/ACT inhaler    Sig: Inhale 2 puffs into the lungs 2 (two) times daily.    Dispense:  1 Inhaler    Refill:  12    Problem List Items Addressed This Visit      Unprioritized   Acute bilateral low back pain    Refer to ortho  Check xrays since pain has worsened      Relevant Medications   HYDROcodone-acetaminophen (NORCO/VICODIN) 5-325 MG tablet   cyclobenzaprine (FLEXERIL) 10 MG tablet   Anxiety state    Refill med Stable       Relevant Medications   venlafaxine XR (EFFEXOR-XR) 150 MG 24 hr capsule   CIGARETTE SMOKER    Pt is not ready to quit  Will try to cut down       COPD exacerbation ([place])    Neb given in office with relief Con' t prn albuterol Start flovent hfa bid  rto prn          Relevant Medications   albuterol (PROVENTIL HFA;VENTOLIN HFA) 108 (90 Base) MCG/ACT inhaler   ipratropium-albuterol (DUONEB) 0.5-2.5 (3) MG/3ML nebulizer solution 3 mL (Completed)   fluticasone (FLOVENT HFA) 110 MCG/ACT inhaler   Essential hypertension    Well controlled, no changes to meds. Encouraged heart healthy diet such as the DASH diet and exercise as tolerated.       Relevant Medications   fenofibrate 160 MG tablet   metoprolol tartrate (LOPRESSOR) 25 MG tablet   potassium chloride SA (K-DUR,KLOR-CON) 20 MEQ tablet   Other Relevant Orders   Comprehensive metabolic panel (Completed)   Lipid panel (Completed)   Lipid panel   TSH   Comprehensive metabolic panel   Hyperlipidemia    Encouraged heart healthy diet, increase exercise, avoid trans fats, consider a krill oil cap daily      Relevant Medications   fenofibrate 160 MG tablet   metoprolol tartrate (LOPRESSOR) 25 MG tablet   Hypothyroidism    Check labs con't meds       Relevant Medications   levothyroxine (SYNTHROID, LEVOTHROID) 125 MCG tablet   metoprolol tartrate (LOPRESSOR) 25 MG tablet   Other Relevant Orders   TSH (Completed)   Lipid panel   TSH   Comprehensive metabolic panel   Peripheral neuropathy    Gabapentin per orders  rto prn      Relevant Medications   cyclobenzaprine (FLEXERIL) 10 MG tablet   gabapentin (NEURONTIN) 100 MG capsule   venlafaxine XR (EFFEXOR-XR) 150 MG 24 hr capsule   Psoriatic arthritis (HCC)   Relevant Medications   gabapentin (NEURONTIN) 100 MG capsule    Other Visit Diagnoses    Chronic right-sided low back pain with right-sided sciatica    -  Primary   Relevant Medications   HYDROcodone-acetaminophen (NORCO/VICODIN) 5-325 MG tablet   cyclobenzaprine (FLEXERIL) 10 MG tablet   gabapentin (NEURONTIN) 100 MG capsule   venlafaxine XR (EFFEXOR-XR) 150 MG 24 hr capsule   Other Relevant Orders   Pain Mgmt, Profile 8 w/Conf, U   DG  Lumbar Spine Complete (Completed)    Ambulatory referral to Orthopedic Surgery   Hyperlipidemia LDL goal <100       Relevant Medications   fenofibrate 160 MG tablet   metoprolol tartrate (LOPRESSOR) 25 MG tablet   Other Relevant Orders   Comprehensive metabolic panel (Completed)   Lipid panel (Completed)   Lipid panel   TSH   Comprehensive metabolic panel   Cough       Relevant Orders   DG Chest 2 View (Completed)   Depression, major, single episode, mild (HCC)       Relevant Medications   venlafaxine XR (EFFEXOR-XR) 150 MG 24 hr capsule   Bronchospasm       Relevant Medications   fluticasone (FLOVENT HFA) 110 MCG/ACT inhaler      Follow-up: Return in about 6 months (around 12/21/2017), or if symptoms worsen or fail to improve.  Ann Held, DO

## 2017-06-20 NOTE — Patient Instructions (Signed)
Bronchospasm, Adult Bronchospasm is when airways in the lungs get smaller. When this happens, it can be hard to breathe. You may cough. You may also make a whistling sound when you breathe (wheeze). Follow these instructions at home: Medicines  Take over-the-counter and prescription medicines only as told by your doctor.  If you need to use an inhaler or nebulizer to take your medicine, ask your doctor how to use it.  If you were given a spacer, always use it with your inhaler. Lifestyle  Change your heating and air conditioning filter. Do this at least once a month.  Try not to use fireplaces and wood stoves.  Do not  smoke. Do not  allow smoking in your home.  Try not to use things that have a strong smell, like perfume.  Get rid of pests (such as roaches and mice) and their poop.  Remove any mold from your home.  Keep your house clean. Get rid of dust.  Use cleaning products that have no smell.  Replace carpet with wood, tile, or vinyl flooring.  Use allergy-proof pillows, mattress covers, and box spring covers.  Wash bed sheets and blankets every week. Use hot water. Dry them in a dryer.  Use blankets that are made of polyester or cotton.  Wash your hands often.  Keep pets out of your bedroom.  When you exercise, try not to breathe in cold air. General instructions  Have a plan for getting medical care. Know these things: ? When to call your doctor. ? When to call local emergency services (911 in the [place] ? Where to go in an emergency.  Stay up to date on your shots (immunizations).  When you have an episode: ? Stay calm. ? Relax. ? Breathe slowly. Contact a doctor if:  Your muscles ache.  Your chest hurts.  The color of the mucus you cough up (sputum) changes from clear or white to yellow, green, gray, or bloody.  The mucus you cough up gets thicker.  You have a fever. Get help right away if:  The whistling sound gets worse, even after you  take your medicines.  Your coughing gets worse.  You find it even harder to breathe.  Your chest hurts very much. Summary  Bronchospasm is when airways in the lungs get smaller.  When this happens, it can be hard to breathe. You may cough. You may also make a whistling sound when you breathe.  Stay away from things that cause you to have episodes. These include smoke or dust. This information is not intended to replace advice given to you by your health care provider. Make sure you discuss any questions you have with your health care provider. Document Released: 11/22/2008 Document Revised: 01/29/2016 Document Reviewed: 01/29/2016 Elsevier Interactive Patient Education  2017 Elsevier Inc.  

## 2017-06-21 LAB — COMPREHENSIVE METABOLIC PANEL
ALT: 20 U/L (ref 0–35)
AST: 13 U/L (ref 0–37)
Albumin: 4.1 g/dL (ref 3.5–5.2)
Alkaline Phosphatase: 44 U/L (ref 39–117)
BUN: 16 mg/dL (ref 6–23)
CHLORIDE: 97 meq/L (ref 96–112)
CO2: 26 meq/L (ref 19–32)
Calcium: 9.7 mg/dL (ref 8.4–10.5)
Creatinine, Ser: 0.96 mg/dL (ref 0.40–1.20)
GFR: 61.27 mL/min (ref >60.00)
GLUCOSE: 114 mg/dL — AB (ref 70–99)
POTASSIUM: 3.8 meq/L (ref 3.5–5.1)
SODIUM: 134 meq/L — AB (ref 135–145)
Total Bilirubin: 0.3 mg/dL (ref 0.2–1.2)
Total Protein: 6.9 g/dL (ref 6.0–8.3)

## 2017-06-21 LAB — LIPID PANEL
CHOL/HDL RATIO: 12
Cholesterol: 394 mg/dL — ABNORMAL HIGH (ref 0–200)
HDL: 34 mg/dL — AB (ref >39.00)
Triglycerides: 885 mg/dL — ABNORMAL HIGH (ref 0.0–149.0)

## 2017-06-21 LAB — TSH: TSH: 1.56 u[IU]/mL (ref 0.35–4.50)

## 2017-06-21 LAB — LDL CHOLESTEROL, DIRECT: Direct LDL: 82 mg/dL

## 2017-06-21 NOTE — Telephone Encounter (Signed)
Patient was in office yesterday

## 2017-06-22 ENCOUNTER — Telehealth: Payer: Self-pay | Admitting: *Deleted

## 2017-06-22 ENCOUNTER — Encounter: Payer: Self-pay | Admitting: Family Medicine

## 2017-06-22 DIAGNOSIS — J441 Chronic obstructive pulmonary disease with (acute) exacerbation: Secondary | ICD-10-CM | POA: Insufficient documentation

## 2017-06-22 DIAGNOSIS — I700 Atherosclerosis of aorta: Secondary | ICD-10-CM | POA: Insufficient documentation

## 2017-06-22 DIAGNOSIS — M545 Low back pain, unspecified: Secondary | ICD-10-CM | POA: Insufficient documentation

## 2017-06-22 DIAGNOSIS — L4050 Arthropathic psoriasis, unspecified: Secondary | ICD-10-CM | POA: Insufficient documentation

## 2017-06-22 LAB — PAIN MGMT, PROFILE 8 W/CONF, U
6 Acetylmorphine: NEGATIVE ng/mL (ref <10)
ALPHAHYDROXYALPRAZOLAM: NEGATIVE ng/mL (ref <25)
ALPHAHYDROXYTRIAZOLAM: NEGATIVE ng/mL (ref <50)
AMINOCLONAZEPAM: NEGATIVE ng/mL (ref <25)
Alcohol Metabolites: NEGATIVE ng/mL (ref <500)
Alphahydroxymidazolam: NEGATIVE ng/mL (ref <50)
Amphetamines: NEGATIVE ng/mL (ref <500)
BENZODIAZEPINES: POSITIVE ng/mL — AB (ref <100)
BUPRENORPHINE, URINE: NEGATIVE ng/mL (ref <5)
CREATININE: 72.9 mg/dL
Cocaine Metabolite: NEGATIVE ng/mL (ref <150)
Hydroxyethylflurazepam: NEGATIVE ng/mL (ref <50)
Lorazepam: NEGATIVE ng/mL (ref <50)
MDMA: NEGATIVE ng/mL (ref <500)
Marijuana Metabolite: NEGATIVE ng/mL (ref <20)
NORDIAZEPAM: NEGATIVE ng/mL (ref <50)
OPIATES: NEGATIVE ng/mL (ref <100)
OXAZEPAM: 51 ng/mL — AB (ref <50)
OXIDANT: NEGATIVE ug/mL (ref <200)
Oxycodone: NEGATIVE ng/mL (ref <100)
PH: 6.7 (ref 4.5–9.0)
TEMAZEPAM: NEGATIVE ng/mL (ref <50)

## 2017-06-22 NOTE — Assessment & Plan Note (Signed)
Neb given in office with relief Con' t prn albuterol Start flovent hfa bid  rto prn

## 2017-06-22 NOTE — Assessment & Plan Note (Signed)
Refill med  Stable  

## 2017-06-22 NOTE — Assessment & Plan Note (Signed)
Encouraged heart healthy diet, increase exercise, avoid trans fats, consider a krill oil cap daily 

## 2017-06-22 NOTE — Assessment & Plan Note (Signed)
Well controlled, no changes to meds. Encouraged heart healthy diet such as the DASH diet and exercise as tolerated.  °

## 2017-06-22 NOTE — Telephone Encounter (Signed)
Author sent pt. MyChart message relaying Dr. Nonda Lou response regarding cholesterol control and starting on a baby aspirin daily.

## 2017-06-22 NOTE — Assessment & Plan Note (Signed)
Gabapentin per orders  rto prn

## 2017-06-22 NOTE — Telephone Encounter (Signed)
Notes recorded by Marcello Moores, RN on 06/22/2017 at 8:37 AM EDT Pt. Given results. Verbalizes understanding. Requests to speak with Dr. Carollee Herter about Aortic atherosclerosis mentioned on her spine x-ray. tHANKS.

## 2017-06-22 NOTE — Assessment & Plan Note (Signed)
Refer to ortho  Check xrays since pain has worsened

## 2017-06-22 NOTE — Assessment & Plan Note (Signed)
Check labs con't meds 

## 2017-06-22 NOTE — Telephone Encounter (Signed)
Almost everyone has that as they get older.  Cholesterol control is important and take asa 81 mg daily

## 2017-06-22 NOTE — Assessment & Plan Note (Signed)
Pt is not ready to quit  Will try to cut down

## 2017-06-23 ENCOUNTER — Telehealth: Payer: Self-pay

## 2017-06-23 NOTE — Telephone Encounter (Signed)
PA initiated via Covermymeds; KEY: [id number]. Awaiting determination.

## 2017-06-24 NOTE — Telephone Encounter (Signed)
PA approved.   PA Case: [id number], Status: Approved, Coverage Starts on: 06/23/2017 12:00:00 AM, Coverage Ends on: 06/23/2019 12:00:00 AM. Questions? Contact [phone].

## 2017-06-27 ENCOUNTER — Telehealth: Payer: Self-pay | Admitting: *Deleted

## 2017-06-27 NOTE — Telephone Encounter (Signed)
Faxed over xray results to [facility].  Left message on machine that we sent them.

## 2017-06-27 NOTE — Telephone Encounter (Signed)
Copied from [place] [phone]. Topic: Inquiry >> Jun 22, 2017  4:17 PM Conception Chancy, NT wrote: Reason for CRM: patient is calling and states that she spoke with [facility] and they are needing the results and the xrays from Monday 06/20/17 before they can call and schedule her a appt. Please advise.

## 2017-06-29 ENCOUNTER — Other Ambulatory Visit: Payer: Self-pay | Admitting: *Deleted

## 2017-06-29 DIAGNOSIS — E785 Hyperlipidemia, unspecified: Secondary | ICD-10-CM

## 2017-06-29 DIAGNOSIS — I10 Essential (primary) hypertension: Secondary | ICD-10-CM

## 2017-08-29 ENCOUNTER — Other Ambulatory Visit: Payer: Self-pay | Admitting: Family Medicine

## 2017-08-29 DIAGNOSIS — I10 Essential (primary) hypertension: Secondary | ICD-10-CM

## 2017-11-07 ENCOUNTER — Other Ambulatory Visit: Payer: Self-pay | Admitting: Family Medicine

## 2017-11-07 DIAGNOSIS — I10 Essential (primary) hypertension: Secondary | ICD-10-CM

## 2018-02-07 ENCOUNTER — Other Ambulatory Visit: Payer: Self-pay | Admitting: Family Medicine

## 2018-02-07 DIAGNOSIS — I10 Essential (primary) hypertension: Secondary | ICD-10-CM

## 2018-03-24 ENCOUNTER — Other Ambulatory Visit: Payer: Self-pay | Admitting: *Deleted

## 2018-03-24 DIAGNOSIS — I10 Essential (primary) hypertension: Secondary | ICD-10-CM

## 2018-03-24 MED ORDER — LOSARTAN POTASSIUM-HCTZ 100-12.5 MG PO TABS: ORAL_TABLET | ORAL | 0 refills | Status: DC

## 2018-05-08 ENCOUNTER — Other Ambulatory Visit: Payer: Self-pay | Admitting: Family Medicine

## 2018-05-08 ENCOUNTER — Telehealth: Payer: Self-pay | Admitting: Family Medicine

## 2018-05-08 DIAGNOSIS — E785 Hyperlipidemia, unspecified: Secondary | ICD-10-CM

## 2018-05-08 DIAGNOSIS — I10 Essential (primary) hypertension: Secondary | ICD-10-CM

## 2018-05-08 NOTE — Telephone Encounter (Signed)
She needs f/u-- supposed to be seen q21m for pain meds

## 2018-05-08 NOTE — Telephone Encounter (Unsigned)
Copied from [place] [id number]. Topic: Quick Communication - Rx Refill/Question >> May 08, 2018  3:24 PM Alanda Slim E wrote: Medication: HYDROcodone-acetaminophen (NORCO/VICODIN) 5-325 MG tablet  Has the patient contacted their pharmacy?  Yes - call PCP office  Preferred Pharmacy (with phone number or street name): [id number] [address], [place] [phone] (Phone) [phone] (Fax)    Agent: Please be advised that RX refills may take up to 3 business days. We ask that you follow-up with your pharmacy.

## 2018-05-09 NOTE — Telephone Encounter (Signed)
Called patient to inform her that she is supposed to have follow up visit every 3 months and is due for one in order to have medication refilled. Left message on patients  answering machine.

## 2018-05-10 ENCOUNTER — Other Ambulatory Visit: Payer: Self-pay | Admitting: Family Medicine

## 2018-05-10 DIAGNOSIS — M545 Low back pain, unspecified: Secondary | ICD-10-CM

## 2018-05-10 MED ORDER — HYDROCODONE-ACETAMINOPHEN 5-325 MG PO TABS: 1.0000 | ORAL_TABLET | Freq: Four times a day (QID) | ORAL | 0 refills | Status: DC | PRN

## 2018-05-10 NOTE — Telephone Encounter (Signed)
Patient notified that she will need to schedule an appointment for follow up.  She will call as soon as everything is settled.

## 2018-05-10 NOTE — Telephone Encounter (Signed)
I just got to her message.  Is there any way we send in and then get her set up for ov and uds/contract.

## 2018-05-10 NOTE — Telephone Encounter (Signed)
Please advise 

## 2018-05-10 NOTE — Telephone Encounter (Signed)
Patient calling and states that tomorrow is the grave side service for her father. States that she will be standing and walking most of the day, which causes her back to hurt. Would like to know if a few pilled could be sent in to the pharmacy to get her through tomorrow? Patient states that she has not had this medication filled since last May and does not feel comfortable making an appointment at this time due to [place] risks. Please advise.

## 2018-05-10 NOTE — Telephone Encounter (Signed)
She can due web ex-- but is she due for contract / uds?

## 2018-05-10 NOTE — Telephone Encounter (Signed)
I sent it in 

## 2018-06-26 ENCOUNTER — Encounter: Payer: Self-pay | Admitting: Family Medicine

## 2018-06-26 ENCOUNTER — Ambulatory Visit (INDEPENDENT_AMBULATORY_CARE_PROVIDER_SITE_OTHER): Payer: Medicare HMO | Admitting: Family Medicine

## 2018-06-26 ENCOUNTER — Other Ambulatory Visit: Payer: Self-pay

## 2018-06-26 VITALS — BP 150/91 | Wt 206.0 lb

## 2018-06-26 DIAGNOSIS — E039 Hypothyroidism, unspecified: Secondary | ICD-10-CM

## 2018-06-26 DIAGNOSIS — I10 Essential (primary) hypertension: Secondary | ICD-10-CM | POA: Diagnosis not present

## 2018-06-26 DIAGNOSIS — M5137 Other intervertebral disc degeneration, lumbosacral region: Secondary | ICD-10-CM

## 2018-06-26 DIAGNOSIS — M5441 Lumbago with sciatica, right side: Secondary | ICD-10-CM

## 2018-06-26 DIAGNOSIS — F320 Major depressive disorder, single episode, mild: Secondary | ICD-10-CM

## 2018-06-26 DIAGNOSIS — E785 Hyperlipidemia, unspecified: Secondary | ICD-10-CM

## 2018-06-26 MED ORDER — VENLAFAXINE HCL ER 150 MG PO CP24: ORAL_CAPSULE | ORAL | 3 refills | Status: DC

## 2018-06-26 MED ORDER — FENOFIBRATE 160 MG PO TABS: ORAL_TABLET | ORAL | 1 refills | Status: DC

## 2018-06-26 MED ORDER — METOPROLOL TARTRATE 25 MG PO TABS: 25.0000 mg | ORAL_TABLET | Freq: Two times a day (BID) | ORAL | 1 refills | Status: DC

## 2018-06-26 MED ORDER — LEVOTHYROXINE SODIUM 125 MCG PO TABS: 125.0000 ug | ORAL_TABLET | Freq: Every day | ORAL | 3 refills | Status: DC

## 2018-06-26 MED ORDER — LOSARTAN POTASSIUM-HCTZ 100-12.5 MG PO TABS: ORAL_TABLET | ORAL | 1 refills | Status: DC

## 2018-06-26 NOTE — Progress Notes (Signed)
Virtual Visit via Video Note  I connected with Samantha Hanson on 06/26/18 at  3:15 PM EDT by a video enabled telemedicine application and verified that I am speaking with the correct person using two identifiers.  Location: Patient: home Provider: home   I discussed the limitations of evaluation and management by telemedicine and the availability of in person appointments. The patient expressed understanding and agreed to proceed.  History of Present Illness: Pt is home needing refill on her bp meds.  She is c/o low back pain that is worsening ---- she had to cancel her ortho app and wants to wait on going.  She is asking about weight loss as well.     Observations/Objective: Vitals:   06/26/18 1440  BP: (!) 150/91    Recheck 140/92    Wt 206  Ht 5 ft 6in Pt in NAD Assessment and Plan: 1. Essential hypertension Well controlled, no changes to meds. Encouraged heart healthy diet such as the DASH diet and exercise as tolerated.   - losartan-hydrochlorothiazide (HYZAAR) 100-12.5 MG tablet; TAKE 1 TABLET EVERY DAY (NEED MD APPOINTMENT FOR REFILLS)  Dispense: 90 tablet; Refill: 1 - metoprolol tartrate (LOPRESSOR) 25 MG tablet; Take 1 tablet (25 mg total) by mouth 2 (two) times daily.  Dispense: 180 tablet; Refill: 1  2. Depression, major, single episode, mild (HCC) stable - venlafaxine XR (EFFEXOR-XR) 150 MG 24 hr capsule; take 1 capsule by mouth every morning WITH BREAKFAST  Dispense: 90 capsule; Refill: 3  3. Hypothyroidism Stable Will check labs with next ov - levothyroxine (SYNTHROID) 125 MCG tablet; Take 1 tablet (125 mcg total) by mouth daily.  Dispense: 90 tablet; Refill: 3  4. Hyperlipidemia LDL goal <100 Encouraged heart healthy diet, increase exercise, avoid trans fats, consider a krill oil cap daily - fenofibrate 160 MG tablet; TAKE 1 TABLET ONE TIME DAILY WITH FOOD (NEED AN APPOINTMENT IN DECEMBER)  Dispense: 90 tablet; Refill: 1  5. Acute right-sided low back pain  with right-sided sciatica Pt did not want to see ortho right now -- she is willing to try pt first May need mRI---will need open mri - Ambulatory referral to Physical Therapy  6. DDD (degenerative disc disease), lumbosacral See above - Ambulatory referral to Physical Therapy   Follow Up Instructions:    I discussed the assessment and treatment plan with the patient. The patient was provided an opportunity to ask questions and all were answered. The patient agreed with the plan and demonstrated an understanding of the instructions.   The patient was advised to call back or seek an in-person evaluation if the symptoms worsen or if the condition fails to improve as anticipated.  I provided 25 minutes of non-face-to-face time during this encounter.   Ann Held, DO

## 2018-09-28 ENCOUNTER — Telehealth: Payer: Self-pay

## 2018-09-28 NOTE — Telephone Encounter (Signed)
Please schedule patient. See below. Thanks

## 2018-09-28 NOTE — Telephone Encounter (Signed)
I would prefer in person but if she is not comfortable with that -- a virtual is fine as long as it can be video

## 2018-09-28 NOTE — Telephone Encounter (Signed)
Would like patient to have a in person or virtual visit?

## 2018-09-28 NOTE — Telephone Encounter (Signed)
Copied from [place] [id number]. Topic: Referral - Request for Referral >> Sep 28, 2018 11:54 AM Burchel, Samantha Hanson wrote: Pt states pain in her back/legs is worsening and she would like to request an MRI as well as some pain medication. Please advise.   [phone]

## 2018-09-29 NOTE — Telephone Encounter (Signed)
Spoke to pt husband. Pt asleep. Asked him to have her call to schedule in person OV.

## 2018-10-13 ENCOUNTER — Other Ambulatory Visit: Payer: Self-pay

## 2018-10-13 ENCOUNTER — Ambulatory Visit (INDEPENDENT_AMBULATORY_CARE_PROVIDER_SITE_OTHER): Payer: Medicare HMO | Admitting: Family Medicine

## 2018-10-13 ENCOUNTER — Encounter: Payer: Self-pay | Admitting: Family Medicine

## 2018-10-13 DIAGNOSIS — G8929 Other chronic pain: Secondary | ICD-10-CM

## 2018-10-13 DIAGNOSIS — R29898 Other symptoms and signs involving the musculoskeletal system: Secondary | ICD-10-CM | POA: Diagnosis not present

## 2018-10-13 DIAGNOSIS — M545 Low back pain: Secondary | ICD-10-CM | POA: Diagnosis not present

## 2018-10-13 DIAGNOSIS — L4050 Arthropathic psoriasis, unspecified: Secondary | ICD-10-CM | POA: Diagnosis not present

## 2018-10-13 NOTE — Progress Notes (Signed)
Musculoskeletal Exam  Patient: Samantha Hanson DOB: 11-20-1950  DOS: 10/13/2018  SUBJECTIVE:  Chief Complaint:   Low back pain  Samantha Hanson is a 68 y.o.  female for evaluation and treatment of her back pain. Due to COVID-19 pandemic, we are interacting via web portal for an electronic face-to-face visit. I verified patient's ID using 2 identifiers. Patient agreed to proceed with visit via this method. Patient is at home, I am at office. Patient and I are present for visit.    Onset:  1 yr ago. No inj or change in activity.  Location: lower Character:  sharp and shooting  Progression of issue:  has worsened over past few mo Associated symptoms: LLE weakness Denies bowel/bladder incontinence Treatment: to date has been Norco, Tylenol, NSAIDs.   Neurovascular symptoms: +weakness, no paresthesias  ROS: Musculoskeletal/Extremities: +back pain Neurologic: +weakness  Past Medical History:  Diagnosis Date  . Hyperlipidemia   . Migraine   . MVP (mitral valve prolapse)   . Thyroid disease     Objective: No conversational dyspnea Age appropriate judgment and insight Nml affect and mood  Assessment:  Chronic bilateral low back pain, unspecified whether sciatica present - Plan: MR Lumbar Spine Wo Contrast  Psoriatic arthritis ([place]) - Plan: gabapentin (NEURONTIN) 100 MG capsule  Weakness of left lower extremity - Plan: MR Lumbar Spine Wo Contrast  Plan: Orders as above. Will send stretches/exercises. Due to weakness, will order MRI to r/o nerve impingement. F/u prn. The patient voiced understanding and agreement to the plan.   [place], DO 10/13/18  2:26 PM

## 2018-10-16 ENCOUNTER — Emergency Department (HOSPITAL_COMMUNITY): Payer: Medicare HMO

## 2018-10-16 ENCOUNTER — Encounter (HOSPITAL_COMMUNITY): Payer: Self-pay

## 2018-10-16 ENCOUNTER — Emergency Department (HOSPITAL_COMMUNITY)
Admission: EM | Admit: 2018-10-16 | Discharge: 2018-10-16 | Disposition: A | Discharge status: Home / Self Care | Payer: Medicare HMO | Attending: Emergency Medicine | Admitting: Emergency Medicine

## 2018-10-16 ENCOUNTER — Other Ambulatory Visit: Payer: Self-pay

## 2018-10-16 DIAGNOSIS — M5441 Lumbago with sciatica, right side: Secondary | ICD-10-CM | POA: Insufficient documentation

## 2018-10-16 DIAGNOSIS — F17210 Nicotine dependence, cigarettes, uncomplicated: Secondary | ICD-10-CM | POA: Insufficient documentation

## 2018-10-16 DIAGNOSIS — M545 Low back pain, unspecified: Secondary | ICD-10-CM

## 2018-10-16 DIAGNOSIS — J449 Chronic obstructive pulmonary disease, unspecified: Secondary | ICD-10-CM | POA: Insufficient documentation

## 2018-10-16 DIAGNOSIS — R1031 Right lower quadrant pain: Secondary | ICD-10-CM | POA: Insufficient documentation

## 2018-10-16 DIAGNOSIS — I10 Essential (primary) hypertension: Secondary | ICD-10-CM | POA: Diagnosis not present

## 2018-10-16 DIAGNOSIS — E039 Hypothyroidism, unspecified: Secondary | ICD-10-CM | POA: Insufficient documentation

## 2018-10-16 DIAGNOSIS — Z79899 Other long term (current) drug therapy: Secondary | ICD-10-CM | POA: Diagnosis not present

## 2018-10-16 DIAGNOSIS — K8020 Calculus of gallbladder without cholecystitis without obstruction: Secondary | ICD-10-CM | POA: Insufficient documentation

## 2018-10-16 DIAGNOSIS — M5442 Lumbago with sciatica, left side: Secondary | ICD-10-CM | POA: Insufficient documentation

## 2018-10-16 LAB — URINALYSIS, ROUTINE W REFLEX MICROSCOPIC
Bilirubin Urine: NEGATIVE
Glucose, UA: NEGATIVE mg/dL
Hgb urine dipstick: NEGATIVE
Ketones, ur: NEGATIVE mg/dL
Nitrite: NEGATIVE
Protein, ur: NEGATIVE mg/dL
Specific Gravity, Urine: 1.009 (ref 1.005–1.030)
pH: 6 (ref 5.0–8.0)

## 2018-10-16 LAB — CBC WITH DIFFERENTIAL/PLATELET
Abs Immature Granulocytes: 0.01 10*3/uL (ref 0.00–0.07)
Basophils Absolute: 0.1 10*3/uL (ref 0.0–0.1)
Basophils Relative: 1 %
Eosinophils Absolute: 0.1 10*3/uL (ref 0.0–0.5)
Eosinophils Relative: 2 %
HCT: 40.4 % (ref 36.0–46.0)
Hemoglobin: 13.3 g/dL (ref 12.0–15.0)
Immature Granulocytes: 0 %
Lymphocytes Relative: 31 %
Lymphs Abs: 1.9 10*3/uL (ref 0.7–4.0)
MCH: 30.4 pg (ref 26.0–34.0)
MCHC: 32.9 g/dL (ref 30.0–36.0)
MCV: 92.2 fL (ref 80.0–100.0)
Monocytes Absolute: 0.4 10*3/uL (ref 0.1–1.0)
Monocytes Relative: 7 %
Neutro Abs: 3.6 10*3/uL (ref 1.7–7.7)
Neutrophils Relative %: 59 %
Platelets: 256 10*3/uL (ref 150–400)
RBC: 4.38 MIL/uL (ref 3.87–5.11)
RDW: 13.4 % (ref 11.5–15.5)
WBC: 6.1 10*3/uL (ref 4.0–10.5)
nRBC: 0 % (ref 0.0–0.2)

## 2018-10-16 LAB — COMPREHENSIVE METABOLIC PANEL
ALT: 29 U/L (ref 0–44)
AST: 24 U/L (ref 15–41)
Albumin: 4.1 g/dL (ref 3.5–5.0)
Alkaline Phosphatase: 38 U/L (ref 38–126)
Anion gap: 12 (ref 5–15)
BUN: 14 mg/dL (ref 8–23)
CO2: 26 mmol/L (ref 22–32)
Calcium: 10.3 mg/dL (ref 8.9–10.3)
Chloride: 99 mmol/L (ref 98–111)
Creatinine, Ser: 1.11 mg/dL — ABNORMAL HIGH (ref 0.44–1.00)
GFR calc Af Amer: 58 mL/min — ABNORMAL LOW (ref >60)
GFR calc non Af Amer: 50 mL/min — ABNORMAL LOW (ref >60)
Glucose, Bld: 116 mg/dL — ABNORMAL HIGH (ref 70–99)
Potassium: 4 mmol/L (ref 3.5–5.1)
Sodium: 137 mmol/L (ref 135–145)
Total Bilirubin: 0.4 mg/dL (ref 0.3–1.2)
Total Protein: 7 g/dL (ref 6.5–8.1)

## 2018-10-16 LAB — LIPASE, BLOOD: Lipase: 30 U/L (ref 11–51)

## 2018-10-16 MED ORDER — IOHEXOL 300 MG/ML  SOLN
80.0000 mL | Freq: Once | INTRAMUSCULAR | Status: AC | PRN
  Administered 2018-10-16: 80 mL via INTRAVENOUS

## 2018-10-16 MED ORDER — HYDROCODONE-ACETAMINOPHEN 5-325 MG PO TABS: 1.0000 | ORAL_TABLET | Freq: Four times a day (QID) | ORAL | 0 refills | Status: DC | PRN

## 2018-10-16 MED ORDER — ONDANSETRON HCL 4 MG/2ML IJ SOLN
4.0000 mg | Freq: Once | INTRAMUSCULAR | Status: AC
  Administered 2018-10-16: 4 mg via INTRAVENOUS
  Filled 2018-10-16: qty 2

## 2018-10-16 MED ORDER — MORPHINE SULFATE (PF) 4 MG/ML IV SOLN
4.0000 mg | Freq: Once | INTRAVENOUS | Status: AC
  Administered 2018-10-16: 14:00:00 4 mg via INTRAVENOUS
  Filled 2018-10-16: qty 1

## 2018-10-16 MED ORDER — LIDOCAINE 5 % EX PTCH
1.0000 | MEDICATED_PATCH | CUTANEOUS | Status: DC
  Administered 2018-10-16: 11:00:00 1 via TRANSDERMAL
  Filled 2018-10-16: qty 1

## 2018-10-16 MED ORDER — METHOCARBAMOL 500 MG PO TABS: 500.0000 mg | ORAL_TABLET | Freq: Two times a day (BID) | ORAL | 0 refills | Status: DC

## 2018-10-16 MED ORDER — MORPHINE SULFATE (PF) 4 MG/ML IV SOLN
4.0000 mg | Freq: Once | INTRAVENOUS | Status: AC
  Administered 2018-10-16: 4 mg via INTRAVENOUS
  Filled 2018-10-16: qty 1

## 2018-10-16 MED ORDER — METHOCARBAMOL 500 MG PO TABS: 1000.0000 mg | ORAL_TABLET | Freq: Once | ORAL | Status: DC

## 2018-10-16 MED ORDER — METHOCARBAMOL 500 MG PO TABS
500.0000 mg | ORAL_TABLET | Freq: Once | ORAL | Status: AC
  Administered 2018-10-16: 500 mg via ORAL
  Filled 2018-10-16: qty 1

## 2018-10-16 MED ORDER — PREDNISONE 20 MG PO TABS: 40.0000 mg | ORAL_TABLET | Freq: Every day | ORAL | 0 refills | Status: AC

## 2018-10-16 MED ORDER — OXYCODONE-ACETAMINOPHEN 5-325 MG PO TABS
1.0000 | ORAL_TABLET | Freq: Once | ORAL | Status: AC
  Administered 2018-10-16: 12:00:00 1 via ORAL
  Filled 2018-10-16: qty 1

## 2018-10-16 NOTE — ED Notes (Signed)
Patient transported to CT 

## 2018-10-16 NOTE — Discharge Instructions (Addendum)
You were seen here today for Back Pain: Low back pain is discomfort in the lower back that may be due to injuries to muscles and ligaments around the spine. Occasionally, it may be caused by a problem to a part of the spine called a disc. Your back pain should be treated with medicines listed below as well as back exercises and you should follow-up with your primary doctor as planned for MRI.  It is important to know however, if you develop severe or worsening pain, low back pain with fever, numbness, weakness or inability to walk or urinate, you should return to the ER immediately.  Please follow up with your doctor this week for a recheck if still having symptoms.  HOME INSTRUCTIONS Self - care:  The application of heat can help soothe the pain.  Maintaining your daily activities, including walking (this is encouraged), as it will help you get better faster than just staying in bed. Do not life, push, pull anything more than 10 pounds for the next week. I am attaching back exercises that you can do at home to help facilitate your recovery.   Back Exercises - I have attached a handout on back exercises that can be done at home to help facilitate your recovery.   Medications are also useful to help with pain control.   Acetaminophen.  This medication is generally safe, and found over the counter. Take as directed for your age. You should not take more than 8 of the extra strength (500mg ) pills a day (max dose is 4000mg  total OVER one day)  Lidocaine Patches: You can purchase over-the-counter salon pas lidocaine patches, these can be worn for 12 hours directly over the area of pain.  Muscle relaxants:  These medications can help with muscle tightness that is a cause of lower back pain.  Most of these medications can cause drowsiness, and it is not safe to drive or use dangerous machinery while taking them. They are primarily helpful when taken at night before sleep.  Prednisone - This is an oral  steroid.  This medication is best taken with food in the morning.  Please note that this medication can cause anxiety, mood swings, muscle fatigue, increased hunger, weight gain (sodium/fluid retention), poor sleep as well as other symptoms. If you are a diabetic, please monitor your blood sugars at home as this medication can increase your blood sugars. Call your pharmacist if you have any questions.  You will need to follow up with your primary healthcare provider or the Orthopedist in 1-2 weeks for reassessment and persistent symptoms.  Be aware that if you develop new symptoms, such as a fever, leg weakness, difficulty with or loss of control of your urine or bowels, abdominal pain, or more severe pain, you will need to seek medical attention and/or return to the Emergency department.  Additional Information:  Your vital signs today were: BP 138/82 (BP Location: Left Arm)    Pulse 78    Temp 98.2 F (36.8 C) (Oral)    Resp 20    SpO2 98%  If your blood pressure (BP) was elevated above 135/85 this visit, please have this repeated by your doctor within one month. ---------------

## 2018-10-16 NOTE — ED Notes (Signed)
ED Provider at bedside. 

## 2018-10-16 NOTE — ED Notes (Signed)
Patient verbalizes understanding of discharge instructions. Opportunity for questioning and answers were provided. Armband removed by staff, pt discharged from ED via wheelchair to home.  

## 2018-10-16 NOTE — ED Triage Notes (Signed)
Pt endorses lower back pain radiating down both legs. Denies numbness or urinary sx. Having difficulty ambulating.

## 2018-10-16 NOTE — ED Provider Notes (Signed)
[facility] EMERGENCY DEPARTMENT Provider Note   CSN: [id number] Arrival date & time: 10/16/18  [id number]     History   Chief Complaint Chief Complaint  Patient presents with   Leg Pain   Back Pain    HPI Samantha Hanson is a 68 y.o. female.     Samantha Hanson is a 68 y.o. female with a history of hypertension, MVP, thyroid disease, and migraines, who presents to the emergency department for evaluation of low back pain.  Patient reports pain is present in the low back, it is worse just to the right of the lumbar spine, and radiates down into both legs.  She reports that she has had back pain for a long time but it is recently been worsening, she saw her PCP recently for this and was referred for physical therapy, it also looks like an outpatient MRI was ordered but has not yet been scheduled for the patient.  She reports pain is a constant ache that is worse with movement and intermittently radiates into both legs, but she denies weakness or numbness.  She denies any difficulty controlling her bowel or bladder.  No dysuria, urinary frequency, hematuria.  She reports that over the past few days she has had difficulty sleeping due to worsening pain pain seems to wrap around from her back into the right side of her abdomen.  She denies any associated fevers, no nausea or vomiting.  No personal history of cancer or IV drug use.  She has been using aspirin primarily for pain, took Tylenol but did not feel this helped much, has not tried anything else.     Past Medical History:  Diagnosis Date   Hyperlipidemia    Migraine    MVP (mitral valve prolapse)    Thyroid disease     Patient Active Problem List   Diagnosis Date Noted   Acute bilateral low back pain 06/22/2017   Psoriatic arthritis ([place]) 06/22/2017   COPD exacerbation ([place]) 06/22/2017   Aortic atherosclerosis ([place]) 06/22/2017   Falls frequently 03/12/2016   Peripheral neuropathy 07/13/2012   Memory  loss 07/13/2012   Abnormal stress test 06/09/2012   Cellulitis and abscess of right scalp 05/09/2012   Sebaceous cyst 05/05/2012   Eczema 05/05/2012   HTN (hypertension) 10/29/2011   Headache(784.0) 10/29/2011   NEVI, MULTIPLE 02/23/2010   PAIN IN JOINT, MULTIPLE SITES 02/23/2010   POSTMENOPAUSAL STATUS 11/12/2008   Essential hypertension 06/27/2007   Hypothyroidism 06/20/2007   Hyperlipidemia 06/20/2007   Anxiety state 06/20/2007   CIGARETTE SMOKER 06/20/2007   ESOPHAGEAL REFLUX 06/20/2007    Past Surgical History:  Procedure Laterality Date   BUNIONECTOMY     Right foot     OB History   No obstetric history on file.      Home Medications    Prior to Admission medications   Medication Sig Start Date End Date Taking? Authorizing Provider  aspirin 325 MG EC tablet Take 325-650 mg by mouth every 6 (six) hours as needed for pain.   Yes [provider]  calcium carbonate 1250 MG capsule Take 1,250 mg by mouth daily.   Yes [provider]  Cholecalciferol (VITAMIN D) 2000 UNITS CAPS Take by mouth.   Yes [provider]  Coenzyme Q10 (CO Q-10 PO) Take by mouth.   Yes [provider]  Cyanocobalamin (B-12 PO) Take 1 tablet by mouth daily.   Yes [provider]  fenofibrate 160 MG tablet TAKE 1 TABLET ONE TIME  DAILY WITH FOOD (NEED AN APPOINTMENT IN DECEMBER) 06/26/18  Yes Roma Schanz R, DO  fish oil-omega-3 fatty acids 1000 MG capsule Take 1 g by mouth daily.    Yes [provider]  gabapentin (NEURONTIN) 100 MG capsule Take 1 capsule (100 mg total) by mouth 3 (three) times daily. 10/13/18  Yes Shelda Pal, DO  glucosamine-chondroitin 500-400 MG tablet Take 1 tablet by mouth 3 (three) times daily.   Yes [provider]  levothyroxine (SYNTHROID) 125 MCG tablet Take 1 tablet (125 mcg total) by mouth daily. 06/26/18  Yes Roma Schanz R, DO  losartan-hydrochlorothiazide (HYZAAR)  100-12.5 MG tablet TAKE 1 TABLET EVERY DAY (NEED MD APPOINTMENT FOR REFILLS) 06/26/18  Yes Roma Schanz R, DO  metoprolol tartrate (LOPRESSOR) 25 MG tablet Take 1 tablet (25 mg total) by mouth 2 (two) times daily. 06/26/18  Yes Ann Held, DO  Multiple Vitamins-Minerals (MULTIVITAMIN PO) Take 1 tablet by mouth daily.   Yes [provider]  OVER THE COUNTER MEDICATION Primrose oil   Yes [provider]  Potassium 99 MG TABS Take 99 mg by mouth daily.   Yes [provider]  venlafaxine XR (EFFEXOR-XR) 150 MG 24 hr capsule take 1 capsule by mouth every morning WITH BREAKFAST Patient taking differently: Take 150 mg by mouth daily with breakfast.  06/26/18  Yes Carollee Herter, Alferd Apa, DO  HYDROcodone-acetaminophen (NORCO) 5-325 MG tablet Take 1 tablet by mouth every 6 (six) hours as needed. 10/16/18   Jacqlyn Larsen, PA-C  lansoprazole (PREVACID) 30 MG capsule Take 1 capsule (30 mg total) by mouth daily as needed. Patient not taking: Reported on 10/16/2018 08/06/14   Carollee Herter, Alferd Apa, DO  methocarbamol (ROBAXIN) 500 MG tablet Take 1 tablet (500 mg total) by mouth 2 (two) times daily. 10/16/18   Jacqlyn Larsen, PA-C  predniSONE (DELTASONE) 20 MG tablet Take 2 tablets (40 mg total) by mouth daily for 5 days. 10/16/18 10/21/18  Jacqlyn Larsen, PA-C    Family History Family History  Problem Relation Age of Onset   Coronary artery disease Father    Arthritis Father    Heart disease Father        heart transplant candidate but too old--per pt   Thyroid cancer Mother    Osteoporosis Sister     Social History Social History   Tobacco Use   Smoking status: Current Every Day Smoker    Packs/day: 1.50    Years: 50.00    Pack years: 75.00   Smokeless tobacco: Never Used  Substance Use Topics   Alcohol use: No   Drug use: No     Allergies   Patient has no known allergies.   Review of Systems Review of Systems  Constitutional: Negative for  chills and fever.  HENT: Negative.   Respiratory: Negative for cough and shortness of breath.   Cardiovascular: Negative for chest pain.  Gastrointestinal: Positive for abdominal pain. Negative for constipation, diarrhea, nausea and vomiting.  Genitourinary: Negative for dysuria, flank pain, frequency and hematuria.  Musculoskeletal: Positive for back pain and myalgias. Negative for arthralgias.  Skin: Negative for color change and rash.  Neurological: Negative for dizziness, weakness, light-headedness, numbness and headaches.     Physical Exam Updated Vital Signs BP (!) 151/85 (BP Location: Left Arm)    Pulse 86    Temp 98.2 F (36.8 C) (Oral)    Resp 20    SpO2 99%   Physical  Exam Vitals signs and nursing note reviewed.  Constitutional:      General: She is not in acute distress.    Appearance: She is well-developed. She is not diaphoretic.     Comments: Pt appears uncomfortable, sitting on the end of the bed  HENT:     Head: Atraumatic.  Eyes:     General:        Right eye: No discharge.        Left eye: No discharge.  Neck:     Musculoskeletal: Neck supple.     Comments: No c-spine tenderness Cardiovascular:     Rate and Rhythm: Normal rate and regular rhythm.     Pulses: Normal pulses.          Radial pulses are 2+ on the right side and 2+ on the left side.       Dorsalis pedis pulses are 2+ on the right side and 2+ on the left side.       Posterior tibial pulses are 2+ on the right side and 2+ on the left side.     Heart sounds: Normal heart sounds. No murmur. No friction rub. No gallop.   Pulmonary:     Effort: Pulmonary effort is normal. No respiratory distress.     Breath sounds: Normal breath sounds.     Comments: Respirations equal and unlabored, patient able to speak in full sentences, lungs clear to auscultation bilaterally Abdominal:     General: Bowel sounds are normal. There is no distension.     Palpations: Abdomen is soft. There is no mass.      Tenderness: There is no abdominal tenderness. There is no guarding.     Comments: Abdomen soft, nondistended, bowel sounds presents, there is some tenderness extending from the back into the right side of the abdomen, no guarding or peritoneal signs, abdomen otherwise NTTP  Musculoskeletal:     Comments: Tenderness to palpation over right low back just adjacent to midline, pain extends across low back.  Pain made worse with range of motion of the lower extremities. No palpable deformity or skin changes  Skin:    General: Skin is warm and dry.     Capillary Refill: Capillary refill takes less than 2 seconds.  Neurological:     Mental Status: She is alert and oriented to person, place, and time.     Comments: Alert, clear speech, following commands. Moving all extremities without difficulty. Bilateral lower extremities with 5/5 strength in proximal and distal muscle groups and with dorsi and plantar flexion. Sensation intact in bilateral lower extremities. 2+ patellar DTRs bilaterally. Ambulatory with steady gait  Psychiatric:        Mood and Affect: Mood normal.        Behavior: Behavior normal.      ED Treatments / Results  Labs (all labs ordered are listed, but only abnormal results are displayed) Labs Reviewed  COMPREHENSIVE METABOLIC PANEL - Abnormal; Notable for the following components:      Result Value   Glucose, Bld 116 (*)    Creatinine, Ser 1.11 (*)    GFR calc non Af Amer 50 (*)    GFR calc Af Amer 58 (*)    All other components within normal limits  URINALYSIS, ROUTINE W REFLEX MICROSCOPIC - Abnormal; Notable for the following components:   APPearance HAZY (*)    Leukocytes,Ua TRACE (*)    Bacteria, UA MANY (*)    All other components within normal limits  URINE CULTURE  LIPASE, BLOOD  CBC WITH DIFFERENTIAL/PLATELET    EKG None  Radiology Dg Lumbar Spine Complete  Result Date: 10/16/2018 CLINICAL DATA:  Low back pain. EXAM: LUMBAR SPINE - COMPLETE 4+ VIEW  COMPARISON:  06/20/2017 FINDINGS: Normal alignment of the lumbar spine. Negative for a pars defect. Vertebral body heights and disc spaces are maintained. Degenerative endplate changes. Atherosclerotic calcifications in the abdominal aorta. IMPRESSION: 1. No acute abnormality in the lumbar spine. 2.  Aortic atherosclerosis. Electronically Signed   By: Markus Daft M.D.   On: 10/16/2018 13:15   Ct Abdomen Pelvis W Contrast  Result Date: 10/16/2018 CLINICAL DATA:  Left lower quadrant abdomen pain and back pain. Clinical concern for appendicitis. EXAM: CT ABDOMEN AND PELVIS WITH CONTRAST TECHNIQUE: Multidetector CT imaging of the abdomen and pelvis was performed using the standard protocol following bolus administration of intravenous contrast. CONTRAST:  51mL OMNIPAQUE IOHEXOL 300 MG/ML  SOLN COMPARISON:  None. FINDINGS: Lower chest: Minimal right lower lobe paraseptal bullous change. Borderline enlarged heart. Hepatobiliary: Diffuse low density of the liver relative to the spleen. Calcified liver granuloma. Gallstones in the gallbladder measuring up to 8 mm in maximum diameter each. No gallbladder wall thickening or pericholecystic fluid. Small gallbladder Phrygian cap. Pancreas: Unremarkable. No pancreatic ductal dilatation or surrounding inflammatory changes. Spleen: Normal in size without focal abnormality. Adrenals/Urinary Tract: Adrenal glands are unremarkable. Kidneys are normal, without renal calculi, focal lesion, or hydronephrosis. Bladder is unremarkable. Stomach/Bowel: Stomach is within normal limits. Appendix appears normal. No evidence of bowel wall thickening, distention, or inflammatory changes. Vascular/Lymphatic: Atheromatous arterial calcifications without aneurysm. No enlarged lymph nodes. Reproductive: Uterus and bilateral adnexa are unremarkable. Other: Tiny umbilical hernia containing fat. Musculoskeletal: Lumbar and lower thoracic spine degenerative changes. No pars defects, fractures or  subluxations. IMPRESSION: 1. No acute abnormality. 2. Diffuse hepatic steatosis. 3. Cholelithiasis. Electronically Signed   By: Claudie Revering M.D.   On: 10/16/2018 15:01   Ct L-spine No Charge  Result Date: 10/16/2018 CLINICAL DATA:  Low back pain. No reported injury. EXAM: CT LUMBAR SPINE WITHOUT CONTRAST TECHNIQUE: Multidetector CT imaging of the lumbar spine was performed without intravenous contrast administration. Multiplanar CT image reconstructions were also generated. COMPARISON:  Abdomen and pelvis CT obtained at the same time. FINDINGS: Segmentation: 5 non-rib-bearing lumbar vertebrae. Alignment: Normal. Vertebrae: No acute fracture or focal pathologic process. Paraspinal and other soft tissues: Unremarkable. Disc levels: T11-12: Unremarkable T12-L1: Unremarkable L1-2: Mild diffuse disc bulging and mild bilateral facet and ligamentum flavum hypertrophy, producing minimal canal and bilateral foraminal stenosis. L2-3: Mild moderate diffuse disc bulging and mild bilateral facet and ligamentum flavum hypertrophy, producing mild canal stenosis and minimal bilateral foraminal stenosis. L3-4: Mild to moderate diffuse disc bulging, left greater than right, with mild bilateral facet and ligamentum flavum hypertrophy. Mild-to-moderate canal stenosis, mild foraminal stenosis on the left and minimal foraminal stenosis on the right. L4-5: Mild to moderate diffuse disc bulging and spur formation with mild bilateral facet and ligamentum flavum hypertrophy. These changes are producing mild canal stenosis, mild foraminal stenosis on the left and moderate foraminal stenosis on the right. L5-S1: Mild diffuse disc bulging, mild bilateral ligamentum flavum hypertrophy and mild-to-moderate bilateral facet hypertrophy. There is also a small to moderate-sized focal disc protrusion to the left of midline, in contact with the anterior aspect of the thecal sac without thecal sac or nerve root compression. Moderate foraminal  stenosis on the left, mild foraminal stenosis on the right and no significant canal stenosis. IMPRESSION: 1.  No acute abnormality. 2. Multilevel degenerative changes without evidence of nerve root compression at any level, as described above. 3. Small to moderate-sized focal disc protrusion to the left midline at the L5-S1 level with associated spur formation and no evidence of nerve root compression. Electronically Signed   By: Claudie Revering M.D.   On: 10/16/2018 15:11    Procedures Procedures (including critical care time)  Medications Ordered in ED Medications  lidocaine (LIDODERM) 5 % 1 patch (1 patch Transdermal Patch Applied 10/16/18 1129)  oxyCODONE-acetaminophen (PERCOCET/ROXICET) 5-325 MG per tablet 1 tablet (1 tablet Oral Given 10/16/18 1130)  methocarbamol (ROBAXIN) tablet 500 mg (500 mg Oral Given 10/16/18 1130)  morphine 4 MG/ML injection 4 mg (4 mg Intravenous Given 10/16/18 1340)  ondansetron (ZOFRAN) injection 4 mg (4 mg Intravenous Given 10/16/18 1340)  iohexol (OMNIPAQUE) 300 MG/ML solution 80 mL (80 mLs Intravenous Contrast Given 10/16/18 1432)  morphine 4 MG/ML injection 4 mg (4 mg Intravenous Given 10/16/18 1611)     Initial Impression / Assessment and Plan / ED Course  I have reviewed the triage vital signs and the nursing notes.  Pertinent labs & imaging results that were available during my care of the patient were reviewed by me and considered in my medical decision making (see chart for details).  68 year old female presents for evaluation of back pain radiating into both legs she also reports that she noted some pain radiating around to the right side of her abdomen over the past few days.  She was seen by her PCP for this pain and referred for physical therapy, and they are working on scheduling an outpatient MRI, comes in today due to worsening pain, not improving with aspirin and Tylenol at home.  On arrival patient appears uncomfortable but has normal vitals and is in no acute  distress.  She is neurologically intact with no focal deficits and normal reflexes on exam, she has no red flag symptoms to suggest cauda equina syndrome.  Will start with x-ray and pain management and and see if patient is able to ambulate.  X-ray shows degenerative changes but no acute fracture or other acute bony abnormality.  After pain medication patient reports minimal improvement and that she still has difficulty walking.  She continues to express concern about abdominal pain coming around to the abdomen, will get CT abdomen pelvis with lumbar recon and abdominal labs.  Given that patient is neurologically intact I do not think she needs an emergent MRI.  Dr. Roslynn Amble saw and evaluated patient as well is in agreement.  Labs show no leukocytosis and normal hemoglobin, ABG of 116 and creatinine slightly elevated at 1.11, no other electrolyte derangements and normal renal function.  Urinalysis with many bacteria present but only trace leukocytes, no nitrites, no WBCs and no RBCs, patient denies any burning or pain with urination, no urinary frequency and no suprapubic pain.  Will send for culture but I do not think it is necessary to treat at this time and patient is in agreement with this plan.  CT abdomen pelvis without acute findings, cholelithiasis without evidence of acute cholelithiasis and diffuse hepatic steatosis.  Lumbar recon shows multilevel degenerative changes without evidence of nerve root impingement, discussed results with patient will have her follow-up with PCP and I have also provided referral to neurosurgery for continued management of her back pain, discharged on course of pain medication, prednisone and Robaxin.  Return precautions discussed.  Patient able to ambulate with improvement in her pain.  Discharged home in good condition.  Patient discussed with Dr. Roslynn Amble, who saw patient as well and agrees with plan.   Final Clinical Impressions(s) / ED Diagnoses   Final  diagnoses:  Low back pain  Acute midline low back pain with bilateral sciatica  Right lower quadrant abdominal pain  Gallstones    ED Discharge Orders         Ordered    methocarbamol (ROBAXIN) 500 MG tablet  2 times daily     10/16/18 1600    HYDROcodone-acetaminophen (NORCO) 5-325 MG tablet  Every 6 hours PRN     10/16/18 1600    predniSONE (DELTASONE) 20 MG tablet  Daily     10/16/18 1604           Jacqlyn Larsen, [place] 10/19/18 2305    Lucrezia Starch, MD 10/20/18 [phone]

## 2018-10-18 LAB — URINE CULTURE: Culture: >=100000 — AB

## 2018-10-19 ENCOUNTER — Telehealth: Payer: Self-pay | Admitting: *Deleted

## 2018-10-19 ENCOUNTER — Telehealth: Payer: Self-pay

## 2018-10-19 NOTE — Telephone Encounter (Signed)
Post ED Visit - Positive Culture Follow-up  Culture report reviewed by antimicrobial stewardship pharmacist: [place] Team []  Elenor Quinones, Pharm.D. []  Heide Guile, Pharm.D., BCPS AQ-ID []  Parks Neptune, Pharm.D., BCPS []  Alycia Rossetti, Pharm.D., BCPS []  [place], Pharm.D., BCPS, AAHIVP []  Legrand Como, Pharm.D., BCPS, AAHIVP []  Salome Arnt, PharmD, BCPS []  Johnnette Gourd, PharmD, BCPS []  Hughes Better, PharmD, BCPS []  Leeroy Cha, PharmD []  Laqueta Linden, PharmD, BCPS []  Albertina Parr, PharmD  [place] Team []  Leodis Sias, PharmD []  Lindell Spar, PharmD []  Royetta Asal, PharmD []  Graylin Shiver, Rph []  Rema Fendt) Glennon Mac, PharmD []  Arlyn Dunning, PharmD []  Netta Cedars, PharmD []  Dia Sitter, PharmD []  Leone Haven, PharmD []  Gretta Arab, PharmD []  Theodis Shove, PharmD []  Peggyann Juba, PharmD []  Reuel Boom, PharmD   Positive urine culture, reviewed by Alecia Lemming, PA-C and no further patient follow-up is required at this time.  Harlon Flor [facility] 10/19/2018, 12:05 PM

## 2018-10-19 NOTE — Telephone Encounter (Signed)
Copied from [place] [phone]. Topic: General - Other >> Oct 19, 2018  1:40 PM Carolyn Stare wrote: Pt req a referral to Dr Arnoldo Morale at [facility]rologist

## 2018-10-20 NOTE — Telephone Encounter (Signed)
Please advise 

## 2018-10-20 NOTE — Telephone Encounter (Signed)
If she means the neuro surgeon---  They will want to MRI firs --- has it been scheduled

## 2018-10-23 NOTE — Telephone Encounter (Signed)
Pt called to check on the status of this referral request.

## 2018-10-23 NOTE — Telephone Encounter (Signed)
Pt called in to follow up. Pt says that she is scheduled to have MRI on this Sunday.

## 2018-10-24 NOTE — Telephone Encounter (Signed)
Patient is requesting call back from [facility], as soon as possible, to discuss referral further.

## 2018-10-25 ENCOUNTER — Telehealth: Payer: Self-pay

## 2018-10-25 NOTE — Telephone Encounter (Signed)
Please advise 

## 2018-10-25 NOTE — Telephone Encounter (Signed)
Copied from [place] [phone]. Topic: General - Other >> Oct 25, 2018  3:01 PM Mathis Bud wrote: Reason for CRM: Patient is requesting PCP to send her something in for anxiety for her MRI on Monday. PHARMACY [facility] [phone] - [place], [place] [phone] (Phone) [phone] (Fax)     Patient call back [phone]

## 2018-10-26 ENCOUNTER — Other Ambulatory Visit: Payer: Self-pay | Admitting: Family Medicine

## 2018-10-26 MED ORDER — ALPRAZOLAM 0.5 MG PO TABS: ORAL_TABLET | ORAL | 0 refills | Status: DC

## 2018-10-26 NOTE — Telephone Encounter (Signed)
Xanax sent to pharmacy--- take 1 30 min prior to procedure and bring bottle with her in case they need to give her another

## 2018-10-27 NOTE — Telephone Encounter (Signed)
Pt was called and given information.  

## 2018-10-29 ENCOUNTER — Ambulatory Visit (HOSPITAL_COMMUNITY)
Admission: RE | Admit: 2018-10-29 | Discharge: 2018-10-29 | Disposition: A | Discharge status: Home / Self Care | Payer: Medicare HMO | Source: Ambulatory Visit | Attending: Family Medicine | Admitting: Family Medicine

## 2018-10-29 ENCOUNTER — Other Ambulatory Visit: Payer: Self-pay

## 2018-10-29 DIAGNOSIS — M545 Low back pain: Secondary | ICD-10-CM | POA: Insufficient documentation

## 2018-10-29 DIAGNOSIS — R29898 Other symptoms and signs involving the musculoskeletal system: Secondary | ICD-10-CM | POA: Insufficient documentation

## 2018-10-29 DIAGNOSIS — G8929 Other chronic pain: Secondary | ICD-10-CM | POA: Insufficient documentation

## 2018-10-31 ENCOUNTER — Telehealth: Payer: Self-pay

## 2018-10-31 NOTE — Telephone Encounter (Signed)
Copied from [place] [phone]. Topic: General - Inquiry >> Oct 31, 2018  2:24 PM Reyne Dumas L wrote: Reason for CRM:   Pt calling to find out if MRI results from Sunday morning are back yet. Pt can be reached at [phone]    Please advise

## 2018-10-31 NOTE — Telephone Encounter (Signed)
Dr Nani Ravens ordered it  Did he not let her know?

## 2018-11-01 NOTE — Telephone Encounter (Signed)
Dr. Nani Ravens, Can you result the MRI  Please advise

## 2018-11-01 NOTE — Telephone Encounter (Signed)
Pt called in to follow up on MRI results? Please advise   CB: [phone]

## 2018-11-01 NOTE — Telephone Encounter (Signed)
Please let pt know there are some degenerative changes as one would expect for someone her age. There is no evidence of an nerve compression or issues that require the advice of a surgeon at this point.  I don't recall getting this result, sorry for delay.

## 2018-11-02 ENCOUNTER — Other Ambulatory Visit: Payer: Self-pay

## 2018-11-02 DIAGNOSIS — L4050 Arthropathic psoriasis, unspecified: Secondary | ICD-10-CM

## 2018-11-02 DIAGNOSIS — I10 Essential (primary) hypertension: Secondary | ICD-10-CM

## 2018-11-02 MED ORDER — LOSARTAN POTASSIUM-HCTZ 100-12.5 MG PO TABS: ORAL_TABLET | ORAL | 1 refills | Status: DC

## 2018-11-02 MED ORDER — METOPROLOL TARTRATE 25 MG PO TABS: 25.0000 mg | ORAL_TABLET | Freq: Two times a day (BID) | ORAL | 1 refills | Status: DC

## 2018-11-02 MED ORDER — GABAPENTIN 100 MG PO CAPS: 100.0000 mg | ORAL_CAPSULE | Freq: Three times a day (TID) | ORAL | 1 refills | Status: DC

## 2018-11-02 MED ORDER — HYDROCODONE-ACETAMINOPHEN 5-325 MG PO TABS: 1.0000 | ORAL_TABLET | Freq: Four times a day (QID) | ORAL | 0 refills | Status: DC | PRN

## 2018-11-02 NOTE — Telephone Encounter (Signed)
Patient would like to know her next steps. Please advise.

## 2018-11-02 NOTE — Addendum Note (Signed)
Addended by: Wynonia Musty A on: 11/02/2018 10:35 AM   Modules accepted: Orders

## 2018-11-02 NOTE — Telephone Encounter (Signed)
She also states she is still in severe pain, she's wondering if her leg pain is caused by the back pain as well. States they did her MRI without contrast when she was told it would be with-however dr. Nani Ravens ordered without. She does not think it was accurate-but wants your opinion.   She is requesting a refill on the [facility]codone [facility] [address]. I have pended request.   Requesting:Hydrocodone Contract:12/07/2016 UDS:06/20/2017 Last Visit:10/13/2018 Next Visit:none scheduled Last Refill:10/16/2018 8 tablets  Please Advise

## 2018-11-02 NOTE — Telephone Encounter (Signed)
It is accurate and for what I was looking for - nerve impingement that could be leading to lower extremity weakness- was made clear on the imaging ordered. I never filled hydrocodone for her, will defer to PCP for this. She likely needs an ED follow up with Dr. Etter Sjogren.

## 2018-11-03 ENCOUNTER — Other Ambulatory Visit: Payer: Self-pay | Admitting: Family Medicine

## 2018-11-03 DIAGNOSIS — M5126 Other intervertebral disc displacement, lumbar region: Secondary | ICD-10-CM

## 2018-11-03 DIAGNOSIS — M5136 Other intervertebral disc degeneration, lumbar region: Secondary | ICD-10-CM

## 2018-11-03 MED ORDER — HYDROCODONE-ACETAMINOPHEN 5-325 MG PO TABS: 1.0000 | ORAL_TABLET | Freq: Four times a day (QID) | ORAL | 0 refills | Status: DC | PRN

## 2018-11-03 NOTE — Telephone Encounter (Signed)
Pt states her regular dr is Dr Etter Sjogren, and Dr Etter Sjogren is the one that refills her hydrocodone.  Pt only got 8 pills and usually gets 30.  8 is not enough.  Pt would like new rx  HYDROcodone-acetaminophen ([place]) 5-325 MG tablet  [facility] [phone] - [place], [place] AT [address] [phone] (Phone) [phone] (   This was sent to Dr Nani Ravens

## 2018-11-03 NOTE — Telephone Encounter (Signed)
Referral placed Refill #30 sent

## 2018-11-03 NOTE — Telephone Encounter (Signed)
Please see below. Pt states only getting 8 pills of [place] and wants new Rx. Pt wanting referrals. Please advise

## 2018-11-03 NOTE — Telephone Encounter (Signed)
Pt states someone was to call her about what she can do to relieve the pain she is going through.  Pt states she cannot walk more than 30 feet.  Pt cannot sleep, cannot get comfortable. Pt would like to find out what is going on.  Pt is requesting a referral to a surgeon, like [facility]. Pt would prefer to see Dr Arnoldo Morale there if she can.  Please advise.

## 2018-11-30 ENCOUNTER — Ambulatory Visit: Payer: Medicare HMO

## 2019-01-03 ENCOUNTER — Ambulatory Visit: Payer: Medicare HMO

## 2019-01-11 ENCOUNTER — Other Ambulatory Visit: Payer: Self-pay | Admitting: Family Medicine

## 2019-01-11 ENCOUNTER — Telehealth: Payer: Self-pay | Admitting: Family Medicine

## 2019-01-11 DIAGNOSIS — M5126 Other intervertebral disc displacement, lumbar region: Secondary | ICD-10-CM

## 2019-01-11 DIAGNOSIS — M5136 Other intervertebral disc degeneration, lumbar region: Secondary | ICD-10-CM

## 2019-01-11 MED ORDER — HYDROCODONE-ACETAMINOPHEN 5-325 MG PO TABS: 1.0000 | ORAL_TABLET | Freq: Four times a day (QID) | ORAL | 0 refills | Status: DC | PRN

## 2019-01-11 NOTE — Telephone Encounter (Signed)
Last written: 11/03/18 Last ov: 10/13/18 Next ov: nothing scheduled Contract: due UDS: due

## 2019-01-11 NOTE — Telephone Encounter (Signed)
Patient requesting HYDROcodone-acetaminophen ([place]) 5-325 MG tablet , informed please allow 48 to 72 hour turn around time  [place], [place]

## 2019-01-11 NOTE — Telephone Encounter (Signed)
Refilled But she needs an ov to update uds and contract

## 2019-01-12 NOTE — Telephone Encounter (Signed)
Patient notified that rx was sent in and advised that she needs to be seen every 3 months and uds every 6 months and to sign contract.  Patient declined scheduling ov at this time and she will call back to schedule.

## 2019-03-07 ENCOUNTER — Ambulatory Visit: Payer: Medicare HMO

## 2019-03-15 ENCOUNTER — Ambulatory Visit: Payer: Medicare HMO | Attending: Internal Medicine

## 2019-03-15 DIAGNOSIS — Z23 Encounter for immunization: Secondary | ICD-10-CM | POA: Insufficient documentation

## 2019-03-15 NOTE — Progress Notes (Signed)
   Covid-19 Vaccination Clinic  Name:  Samantha Hanson    MRN: [id number] DOB: [birth year]  03/15/2019  Samantha Hanson was observed post Covid-19 immunization for 15 minutes without incidence. She was provided with Vaccine Information Sheet and instruction to access the V-Safe system.   Samantha Hanson was instructed to call 911 with any severe reactions post vaccine: [name] Difficulty breathing  . Swelling of your face and throat  . A fast heartbeat  . A bad rash all over your body  . Dizziness and weakness    Immunizations Administered    Name Date Dose VIS Date Route   Pfizer COVID-19 Vaccine 03/15/2019  3:04 PM 0.3 mL 01/19/2019 Intramuscular   Manufacturer: [place]   Lot: [id number]   [place]: [id number]

## 2019-03-18 ENCOUNTER — Ambulatory Visit: Payer: Medicare HMO

## 2019-03-19 ENCOUNTER — Ambulatory Visit: Payer: Medicare HMO

## 2019-03-22 ENCOUNTER — Ambulatory Visit: Payer: Medicare HMO

## 2019-04-09 ENCOUNTER — Ambulatory Visit: Payer: Medicare HMO | Attending: Internal Medicine

## 2019-04-09 DIAGNOSIS — Z23 Encounter for immunization: Secondary | ICD-10-CM

## 2019-04-09 NOTE — Progress Notes (Signed)
   Covid-19 Vaccination Clinic  Name:  Samantha Hanson    MRN: [id number] DOB: 1950-03-28  04/09/2019  [name] was observed post Covid-19 immunization for 15 minutes without incidence. She was provided with Vaccine Information Sheet and instruction to access the V-Safe system.   [name] was instructed to call 911 with any severe reactions post vaccine: [name] Difficulty breathing  . Swelling of your face and throat  . A fast heartbeat  . A bad rash all over your body  . Dizziness and weakness    Immunizations Administered    Name Date Dose VIS Date Route   Pfizer COVID-19 Vaccine 04/09/2019  2:09 PM 0.3 mL 01/19/2019 Intramuscular   Manufacturer: [place]   Lot: [id number]   [place]: [id number]

## 2019-05-24 ENCOUNTER — Other Ambulatory Visit: Payer: Self-pay | Admitting: Family Medicine

## 2019-05-24 DIAGNOSIS — E785 Hyperlipidemia, unspecified: Secondary | ICD-10-CM

## 2019-05-24 DIAGNOSIS — I10 Essential (primary) hypertension: Secondary | ICD-10-CM

## 2019-07-16 ENCOUNTER — Other Ambulatory Visit: Payer: Self-pay

## 2019-07-16 DIAGNOSIS — E039 Hypothyroidism, unspecified: Secondary | ICD-10-CM

## 2019-07-16 MED ORDER — LEVOTHYROXINE SODIUM 125 MCG PO TABS: 125.0000 ug | ORAL_TABLET | Freq: Every day | ORAL | 0 refills | Status: DC

## 2019-07-19 ENCOUNTER — Other Ambulatory Visit: Payer: Self-pay | Admitting: Family Medicine

## 2019-07-19 DIAGNOSIS — I10 Essential (primary) hypertension: Secondary | ICD-10-CM

## 2019-07-19 DIAGNOSIS — L4050 Arthropathic psoriasis, unspecified: Secondary | ICD-10-CM

## 2019-07-19 MED ORDER — LOSARTAN POTASSIUM-HCTZ 100-12.5 MG PO TABS: ORAL_TABLET | ORAL | 2 refills | Status: DC

## 2019-07-19 MED ORDER — GABAPENTIN 100 MG PO CAPS: 100.0000 mg | ORAL_CAPSULE | Freq: Three times a day (TID) | ORAL | 1 refills | Status: DC

## 2019-07-19 NOTE — Telephone Encounter (Signed)
Medication:  gabapentin (NEURONTIN) 100 MG capsule   losartan-hydrochlorothiazide (HYZAAR) 100-12.5 MG tablet    Has the patient contacted their pharmacy?  (If no, request that the patient contact the pharmacy for the refill.) (If yes, when and what did the pharmacy advise?)     Preferred Pharmacy (with phone number or street name):[facility] - [place], [place] [address]  Phone:  [phone] Fax:  [phone]      Agent: Please be advised that RX refills may take up to 3 business days. We ask that you follow-up with your pharmacy.

## 2019-07-19 NOTE — Telephone Encounter (Signed)
Rx sent 

## 2019-07-19 NOTE — Addendum Note (Signed)
Addended by: Jeronimo Greaves on: 07/19/2019 09:48 AM   Modules accepted: Orders

## 2019-08-06 ENCOUNTER — Other Ambulatory Visit: Payer: Self-pay | Admitting: Family Medicine

## 2019-08-06 DIAGNOSIS — F320 Major depressive disorder, single episode, mild: Secondary | ICD-10-CM

## 2019-10-02 ENCOUNTER — Other Ambulatory Visit: Payer: Self-pay | Admitting: Family Medicine

## 2019-10-02 DIAGNOSIS — I10 Essential (primary) hypertension: Secondary | ICD-10-CM

## 2019-10-03 ENCOUNTER — Telehealth (INDEPENDENT_AMBULATORY_CARE_PROVIDER_SITE_OTHER): Payer: Medicare HMO | Admitting: Family Medicine

## 2019-10-03 ENCOUNTER — Telehealth: Payer: Self-pay | Admitting: Family Medicine

## 2019-10-03 ENCOUNTER — Encounter: Payer: Self-pay | Admitting: Family Medicine

## 2019-10-03 ENCOUNTER — Other Ambulatory Visit: Payer: Self-pay

## 2019-10-03 VITALS — Wt 196.0 lb

## 2019-10-03 DIAGNOSIS — F418 Other specified anxiety disorders: Secondary | ICD-10-CM

## 2019-10-03 DIAGNOSIS — E538 Deficiency of other specified B group vitamins: Secondary | ICD-10-CM

## 2019-10-03 DIAGNOSIS — E039 Hypothyroidism, unspecified: Secondary | ICD-10-CM

## 2019-10-03 DIAGNOSIS — I10 Essential (primary) hypertension: Secondary | ICD-10-CM | POA: Diagnosis not present

## 2019-10-03 DIAGNOSIS — E559 Vitamin D deficiency, unspecified: Secondary | ICD-10-CM | POA: Diagnosis not present

## 2019-10-03 MED ORDER — VENLAFAXINE HCL ER 75 MG PO CP24: ORAL_CAPSULE | ORAL | 1 refills | Status: DC

## 2019-10-03 MED ORDER — LOSARTAN POTASSIUM-HCTZ 100-12.5 MG PO TABS: ORAL_TABLET | ORAL | 1 refills | Status: DC

## 2019-10-03 NOTE — Telephone Encounter (Signed)
Error pt is being seen today

## 2019-10-03 NOTE — Progress Notes (Signed)
Virtual Visit via Video Note  I connected with Samantha Hanson on 10/03/19 at 11:00 AM EDT by a video enabled telemedicine application and verified that I am speaking with the correct person using two identifiers.  Location: Patient: home alone  Provider: home    I discussed the limitations of evaluation and management by telemedicine and the availability of in person appointments. The patient expressed understanding and agreed to proceed.  History of Present Illness: Pt is home with c/o depression / anxiety worse due to her son being in hospital for months with strokes due to htn.   Pt also needs f/u her bp but her machine is not working correctly    No other complaints    Observations/Objective: There were no vitals filed for this visit. Pt unable to check bp due to bp cuff not working  Pt in NAD Assessment and Plan: 1. Essential hypertension Poorly controlled will alter medications, encouraged DASH diet, minimize caffeine and obtain adequate sleep. Report concerning symptoms and follow up as directed and as needed - losartan-hydrochlorothiazide (HYZAAR) 100-12.5 MG tablet; Take 1 tablet daily  Dispense: 90 tablet; Refill: 1 - Comprehensive metabolic panel; Future - Lipid panel; Future  2. Depression with anxiety Increase effexor to 225 mg daily  F/u 3 months or sooner prn   - venlafaxine XR (EFFEXOR XR) 75 MG 24 hr capsule; 3 po qd  Dispense: 270 capsule; Refill: 1  3. Vitamin D deficiency Check labs  - Vitamin D (25 hydroxy); Future  4. B12 deficiency Check labs  - Vitamin B12; Future  5. Hypothyroidism, unspecified type Stable con't meds Check labs  - TSH; Future   Follow Up Instructions:    I discussed the assessment and treatment plan with the patient. The patient was provided an opportunity to ask questions and all were answered. The patient agreed with the plan and demonstrated an understanding of the instructions.   The patient was advised to call back or  seek an in-person evaluation if the symptoms worsen or if the condition fails to improve as anticipated.  I provided 25 minutes of non-face-to-face time during this encounter.   Ann Held, DO

## 2019-11-13 ENCOUNTER — Telehealth: Payer: Self-pay

## 2019-11-13 NOTE — Telephone Encounter (Signed)
LMOVM for pt to call office back to schedule a BP check with nurse and lab appt.  Prefer to have visits scheduled when

## 2020-01-15 ENCOUNTER — Other Ambulatory Visit: Payer: Self-pay | Admitting: Family Medicine

## 2020-01-15 DIAGNOSIS — E039 Hypothyroidism, unspecified: Secondary | ICD-10-CM

## 2020-01-21 ENCOUNTER — Other Ambulatory Visit: Payer: Self-pay | Admitting: Family Medicine

## 2020-01-21 ENCOUNTER — Telehealth: Payer: Self-pay

## 2020-01-21 DIAGNOSIS — R739 Hyperglycemia, unspecified: Secondary | ICD-10-CM

## 2020-01-21 DIAGNOSIS — M5136 Other intervertebral disc degeneration, lumbar region: Secondary | ICD-10-CM

## 2020-01-21 DIAGNOSIS — L4050 Arthropathic psoriasis, unspecified: Secondary | ICD-10-CM

## 2020-01-21 DIAGNOSIS — I10 Essential (primary) hypertension: Secondary | ICD-10-CM

## 2020-01-21 MED ORDER — LOSARTAN POTASSIUM-HCTZ 100-12.5 MG PO TABS: ORAL_TABLET | ORAL | 1 refills | Status: DC

## 2020-01-21 MED ORDER — GABAPENTIN 100 MG PO CAPS: 100.0000 mg | ORAL_CAPSULE | Freq: Three times a day (TID) | ORAL | 1 refills | Status: DC

## 2020-01-21 MED ORDER — HYDROCODONE-ACETAMINOPHEN 5-325 MG PO TABS
1.0000 | ORAL_TABLET | Freq: Four times a day (QID) | ORAL | 0 refills | Status: DC | PRN
Start: 2020-01-21 — End: 2022-01-07

## 2020-01-21 NOTE — Telephone Encounter (Signed)
Requesting: hydrocodone 5-325mg  Contract: 12/07/2016 UDS: 06/20/2017 Last Visit: 10/03/2019 Next Visit: None Last Refill:01/11/2019 #30 and 0RF  Please Advise

## 2020-01-21 NOTE — Telephone Encounter (Signed)
added

## 2020-01-21 NOTE — Telephone Encounter (Signed)
Patient states she also wants her blood sugar check.

## 2020-01-21 NOTE — Telephone Encounter (Signed)
Pt called stating she needs a refill on Gabapentin & Losartan sent into [facility] mail order pharmacy.  She is coming in for labs in January (per pt request)that were put in back in August.  She is requesting an A1C be added to that panel to be drawn in January.

## 2020-01-21 NOTE — Telephone Encounter (Signed)
See other telenote

## 2020-01-21 NOTE — Telephone Encounter (Signed)
Refills sent. Okay to add A1C?

## 2020-02-07 ENCOUNTER — Other Ambulatory Visit: Payer: Self-pay

## 2020-02-11 ENCOUNTER — Other Ambulatory Visit (INDEPENDENT_AMBULATORY_CARE_PROVIDER_SITE_OTHER): Payer: Medicare HMO

## 2020-02-11 ENCOUNTER — Other Ambulatory Visit: Payer: Self-pay

## 2020-02-11 ENCOUNTER — Other Ambulatory Visit: Payer: Medicare HMO

## 2020-02-11 DIAGNOSIS — I10 Essential (primary) hypertension: Secondary | ICD-10-CM | POA: Diagnosis not present

## 2020-02-11 DIAGNOSIS — R739 Hyperglycemia, unspecified: Secondary | ICD-10-CM | POA: Diagnosis not present

## 2020-02-11 DIAGNOSIS — E039 Hypothyroidism, unspecified: Secondary | ICD-10-CM

## 2020-02-11 DIAGNOSIS — E559 Vitamin D deficiency, unspecified: Secondary | ICD-10-CM | POA: Diagnosis not present

## 2020-02-11 DIAGNOSIS — E538 Deficiency of other specified B group vitamins: Secondary | ICD-10-CM

## 2020-02-11 LAB — COMPREHENSIVE METABOLIC PANEL
ALT: 17 U/L (ref 0–35)
AST: 13 U/L (ref 0–37)
Albumin: 4.6 g/dL (ref 3.5–5.2)
Alkaline Phosphatase: 35 U/L — ABNORMAL LOW (ref 39–117)
BUN: 25 mg/dL — ABNORMAL HIGH (ref 6–23)
CO2: 30 mEq/L (ref 19–32)
Calcium: 10.2 mg/dL (ref 8.4–10.5)
Chloride: 95 mEq/L — ABNORMAL LOW (ref 96–112)
Creatinine, Ser: 1.38 mg/dL — ABNORMAL HIGH (ref 0.40–1.20)
GFR: 38.51 mL/min — ABNORMAL LOW (ref >60.00)
Glucose, Bld: 115 mg/dL — ABNORMAL HIGH (ref 70–99)
Potassium: 4.2 mEq/L (ref 3.5–5.1)
Sodium: 134 mEq/L — ABNORMAL LOW (ref 135–145)
Total Bilirubin: 0.4 mg/dL (ref 0.2–1.2)
Total Protein: 6.8 g/dL (ref 6.0–8.3)

## 2020-02-11 LAB — LIPID PANEL
Cholesterol: 330 mg/dL — ABNORMAL HIGH (ref 0–200)
HDL: 37.9 mg/dL — ABNORMAL LOW (ref >39.00)
NonHDL: 291.76
Total CHOL/HDL Ratio: 9
Triglycerides: 349 mg/dL — ABNORMAL HIGH (ref 0.0–149.0)
VLDL: 69.8 mg/dL — ABNORMAL HIGH (ref 0.0–40.0)

## 2020-02-11 LAB — VITAMIN D 25 HYDROXY (VIT D DEFICIENCY, FRACTURES): VITD: 41.45 ng/mL (ref 30.00–100.00)

## 2020-02-11 LAB — LDL CHOLESTEROL, DIRECT: Direct LDL: 108 mg/dL

## 2020-02-11 LAB — HEMOGLOBIN A1C: Hgb A1c MFr Bld: 6.6 % — ABNORMAL HIGH (ref 4.6–6.5)

## 2020-02-11 LAB — VITAMIN B12: Vitamin B-12: 279 pg/mL (ref 211–911)

## 2020-02-11 LAB — TSH: TSH: 7.75 u[IU]/mL — ABNORMAL HIGH (ref 0.35–4.50)

## 2020-02-11 NOTE — Addendum Note (Signed)
Addended by: Rosita Kea on: 02/11/2020 09:43 AM   Modules accepted: Orders

## 2020-02-11 NOTE — Addendum Note (Signed)
Addended by: Rosita Kea on: 02/11/2020 09:44 AM   Modules accepted: Orders

## 2020-02-12 ENCOUNTER — Other Ambulatory Visit: Payer: Self-pay

## 2020-02-12 MED ORDER — LEVOTHYROXINE SODIUM 150 MCG PO TABS: 150.0000 ug | ORAL_TABLET | Freq: Every day | ORAL | 2 refills | Status: DC

## 2020-02-13 ENCOUNTER — Other Ambulatory Visit: Payer: Medicare HMO

## 2020-04-03 ENCOUNTER — Other Ambulatory Visit: Payer: Self-pay | Admitting: Family Medicine

## 2020-04-03 DIAGNOSIS — I10 Essential (primary) hypertension: Secondary | ICD-10-CM

## 2020-05-12 ENCOUNTER — Other Ambulatory Visit: Payer: Self-pay | Admitting: Family Medicine

## 2020-05-12 DIAGNOSIS — I10 Essential (primary) hypertension: Secondary | ICD-10-CM

## 2020-05-15 ENCOUNTER — Telehealth: Payer: Self-pay | Admitting: *Deleted

## 2020-05-15 ENCOUNTER — Telehealth: Payer: Self-pay

## 2020-05-15 ENCOUNTER — Other Ambulatory Visit: Payer: Self-pay

## 2020-05-15 DIAGNOSIS — I10 Essential (primary) hypertension: Secondary | ICD-10-CM

## 2020-05-15 MED ORDER — LOSARTAN POTASSIUM-HCTZ 100-12.5 MG PO TABS: ORAL_TABLET | ORAL | 1 refills | Status: DC

## 2020-05-15 MED ORDER — METOPROLOL TARTRATE 25 MG PO TABS: 25.0000 mg | ORAL_TABLET | Freq: Two times a day (BID) | ORAL | 1 refills | Status: DC

## 2020-05-15 NOTE — Telephone Encounter (Signed)
Refills have been sent.  

## 2020-05-15 NOTE — Telephone Encounter (Signed)
Error

## 2020-05-15 NOTE — Telephone Encounter (Signed)
Caller states that she needs a refill on her medication as she is almost out and the pharmacy is stating that the dr is denying the refill.  Additional comment: Caller states that she needs a refill on her blood pressure medication. Office hours provided.

## 2020-05-20 ENCOUNTER — Ambulatory Visit (INDEPENDENT_AMBULATORY_CARE_PROVIDER_SITE_OTHER): Payer: Medicare HMO | Admitting: Family Medicine

## 2020-05-20 ENCOUNTER — Other Ambulatory Visit: Payer: Self-pay

## 2020-05-20 ENCOUNTER — Encounter: Payer: Self-pay | Admitting: Family Medicine

## 2020-05-20 VITALS — BP 126/80 | HR 78 | Temp 98.1°F | Resp 18 | Ht 66.0 in | Wt 193.0 lb

## 2020-05-20 DIAGNOSIS — E538 Deficiency of other specified B group vitamins: Secondary | ICD-10-CM | POA: Diagnosis not present

## 2020-05-20 DIAGNOSIS — L4050 Arthropathic psoriasis, unspecified: Secondary | ICD-10-CM

## 2020-05-20 DIAGNOSIS — E1165 Type 2 diabetes mellitus with hyperglycemia: Secondary | ICD-10-CM | POA: Diagnosis not present

## 2020-05-20 DIAGNOSIS — E1169 Type 2 diabetes mellitus with other specified complication: Secondary | ICD-10-CM | POA: Diagnosis not present

## 2020-05-20 DIAGNOSIS — E785 Hyperlipidemia, unspecified: Secondary | ICD-10-CM | POA: Diagnosis not present

## 2020-05-20 DIAGNOSIS — I10 Essential (primary) hypertension: Secondary | ICD-10-CM | POA: Diagnosis not present

## 2020-05-20 DIAGNOSIS — E2839 Other primary ovarian failure: Secondary | ICD-10-CM

## 2020-05-20 DIAGNOSIS — E039 Hypothyroidism, unspecified: Secondary | ICD-10-CM | POA: Diagnosis not present

## 2020-05-20 DIAGNOSIS — F418 Other specified anxiety disorders: Secondary | ICD-10-CM

## 2020-05-20 DIAGNOSIS — H00011 Hordeolum externum right upper eyelid: Secondary | ICD-10-CM

## 2020-05-20 DIAGNOSIS — M545 Low back pain, unspecified: Secondary | ICD-10-CM

## 2020-05-20 LAB — MICROALBUMIN / CREATININE URINE RATIO
Creatinine,U: 169.1 mg/dL
Microalb Creat Ratio: 0.8 mg/g (ref 0.0–30.0)
Microalb, Ur: 1.3 mg/dL (ref 0.0–1.9)

## 2020-05-20 LAB — LIPID PANEL
Cholesterol: 330 mg/dL — ABNORMAL HIGH (ref 0–200)
HDL: 37.3 mg/dL — ABNORMAL LOW (ref >39.00)
NonHDL: 292.9
Total CHOL/HDL Ratio: 9
Triglycerides: 377 mg/dL — ABNORMAL HIGH (ref 0.0–149.0)
VLDL: 75.4 mg/dL — ABNORMAL HIGH (ref 0.0–40.0)

## 2020-05-20 LAB — COMPREHENSIVE METABOLIC PANEL
ALT: 18 U/L (ref 0–35)
AST: 12 U/L (ref 0–37)
Albumin: 4.1 g/dL (ref 3.5–5.2)
Alkaline Phosphatase: 41 U/L (ref 39–117)
BUN: 16 mg/dL (ref 6–23)
CO2: 26 mEq/L (ref 19–32)
Calcium: 9.7 mg/dL (ref 8.4–10.5)
Chloride: 99 mEq/L (ref 96–112)
Creatinine, Ser: 0.98 mg/dL (ref 0.40–1.20)
GFR: 57.96 mL/min — ABNORMAL LOW (ref >60.00)
Glucose, Bld: 112 mg/dL — ABNORMAL HIGH (ref 70–99)
Potassium: 4.4 mEq/L (ref 3.5–5.1)
Sodium: 136 mEq/L (ref 135–145)
Total Bilirubin: 0.4 mg/dL (ref 0.2–1.2)
Total Protein: 6.9 g/dL (ref 6.0–8.3)

## 2020-05-20 LAB — CBC WITH DIFFERENTIAL/PLATELET
Basophils Absolute: 0.1 10*3/uL (ref 0.0–0.1)
Basophils Relative: 1.2 % (ref 0.0–3.0)
Eosinophils Absolute: 0.1 10*3/uL (ref 0.0–0.7)
Eosinophils Relative: 2.6 % (ref 0.0–5.0)
HCT: 40.4 % (ref 36.0–46.0)
Hemoglobin: 13.5 g/dL (ref 12.0–15.0)
Lymphocytes Relative: 25.7 % (ref 12.0–46.0)
Lymphs Abs: 1.3 10*3/uL (ref 0.7–4.0)
MCHC: 33.4 g/dL (ref 30.0–36.0)
MCV: 87.9 fl (ref 78.0–100.0)
Monocytes Absolute: 0.3 10*3/uL (ref 0.1–1.0)
Monocytes Relative: 6.8 % (ref 3.0–12.0)
Neutro Abs: 3.1 10*3/uL (ref 1.4–7.7)
Neutrophils Relative %: 63.7 % (ref 43.0–77.0)
Platelets: 231 10*3/uL (ref 150.0–400.0)
RBC: 4.6 Mil/uL (ref 3.87–5.11)
RDW: 14 % (ref 11.5–15.5)
WBC: 4.9 10*3/uL (ref 4.0–10.5)

## 2020-05-20 LAB — LDL CHOLESTEROL, DIRECT: Direct LDL: 86 mg/dL

## 2020-05-20 LAB — HEMOGLOBIN A1C: Hgb A1c MFr Bld: 6.6 % — ABNORMAL HIGH (ref 4.6–6.5)

## 2020-05-20 LAB — TSH: TSH: 0.63 u[IU]/mL (ref 0.35–4.50)

## 2020-05-20 LAB — VITAMIN B12: Vitamin B-12: 1301 pg/mL — ABNORMAL HIGH (ref 211–911)

## 2020-05-20 MED ORDER — ERYTHROMYCIN 5 MG/GM OP OINT: 1.0000 "application " | TOPICAL_OINTMENT | Freq: Every day | OPHTHALMIC | 0 refills | Status: DC

## 2020-05-20 MED ORDER — GABAPENTIN 100 MG PO CAPS: 100.0000 mg | ORAL_CAPSULE | Freq: Three times a day (TID) | ORAL | 1 refills | Status: DC

## 2020-05-20 MED ORDER — METOPROLOL TARTRATE 25 MG PO TABS: 25.0000 mg | ORAL_TABLET | Freq: Two times a day (BID) | ORAL | 1 refills | Status: DC

## 2020-05-20 MED ORDER — LOSARTAN POTASSIUM-HCTZ 100-12.5 MG PO TABS: ORAL_TABLET | ORAL | 1 refills | Status: DC

## 2020-05-20 MED ORDER — VENLAFAXINE HCL ER 75 MG PO CP24: ORAL_CAPSULE | ORAL | 3 refills | Status: DC

## 2020-05-20 MED ORDER — LEVOTHYROXINE SODIUM 150 MCG PO TABS: 150.0000 ug | ORAL_TABLET | Freq: Every day | ORAL | 1 refills | Status: DC

## 2020-05-20 NOTE — Assessment & Plan Note (Signed)
Encouraged heart healthy diet, increase exercise, avoid trans fats, consider a krill oil cap daily 

## 2020-05-20 NOTE — Patient Instructions (Signed)

## 2020-05-20 NOTE — Progress Notes (Signed)
Patient ID: Samantha Hanson, female    DOB: [birth year]  Age: 70 y.o. MRN: [id number]    Subjective:  Subjective  HPI Samantha Hanson presents for office visit today. She reports that she is doing ok today and she needs medication refills. She denies any chest pain, SOB, fever, abdominal pain, cough, chills, sore throat, dysuria, urinary incontinence, HA, or N/VD. She states that on average she takes hydrocodone-acetaminophen OP once a week. She reports that a couple of months ago she has developed skin bumps localized to her right eye. She states that she has a mole in her right side of her neck, but it does not bother her. She reports feeling a sudden pinch localized to her right hip. She states that injections have not helped with the back pain. She expresses interest in getting a dexa scan, since her sister was dx with osteoporosis and she might possibly have it too.  Review of Systems  Constitutional: Negative for chills, fatigue and fever.  HENT: Negative for congestion, rhinorrhea, sinus pressure, sinus pain and sore throat.        (+) stye localized to right eye medially   Eyes: Negative for pain.  Respiratory: Negative for cough and shortness of breath.   Cardiovascular: Negative for chest pain, palpitations and leg swelling.  Gastrointestinal: Negative for abdominal pain, blood in stool, diarrhea, nausea and vomiting.  Genitourinary: Negative for decreased urine volume, flank pain, frequency, vaginal bleeding and vaginal discharge.  Neurological: Negative for headaches.       (+) pinching sensation local to right hip    History Past Medical History:  Diagnosis Date  . Hyperlipidemia   . Migraine   . MVP (mitral valve prolapse)   . Thyroid disease     She has a past surgical history that includes Bunionectomy.   Her family history includes Arthritis in her father; Coronary artery disease in her father; Heart disease in her father; Osteoporosis in her sister; Thyroid cancer in her  mother.She reports that she has been smoking. She has a 75.00 pack-year smoking history. She has never used smokeless tobacco. She reports that she does not drink alcohol and does not use drugs.  Current Outpatient Medications on File Prior to Visit  Medication Sig Dispense Refill  . ALPRAZolam (XANAX) 0.5 MG tablet 1 po 30 min prior to procedure and tid prn 10 tablet 0  . aspirin 325 MG EC tablet Take 325-650 mg by mouth every 6 (six) hours as needed for pain.    . calcium carbonate 1250 MG capsule Take 1,250 mg by mouth daily.    . Cholecalciferol (VITAMIN D) 2000 UNITS CAPS Take by mouth.    . Coenzyme Q10 (CO Q-10 PO) Take by mouth.    . Cyanocobalamin (B-12 PO) Take 1 tablet by mouth daily.    . fenofibrate 160 MG tablet TAKE 1 TABLET ONE TIME DAILY WITH FOOD (NEED AN APPOINTMENT IN DECEMBER) 30 tablet 0  . fish oil-omega-3 fatty acids 1000 MG capsule Take 1 g by mouth daily.     [name] glucosamine-chondroitin 500-400 MG tablet Take 1 tablet by mouth 3 (three) times daily.    [name] HYDROcodone-acetaminophen (NORCO) 5-325 MG tablet Take 1 tablet by mouth every 6 (six) hours as needed. 30 tablet 0  . lansoprazole (PREVACID) 30 MG capsule Take 1 capsule (30 mg total) by mouth daily as needed. 90 capsule 0  . methocarbamol (ROBAXIN) 500 MG tablet Take 1 tablet (500 mg total) by mouth 2 (two) times daily.  20 tablet 0  . Multiple Vitamins-Minerals (MULTIVITAMIN PO) Take 1 tablet by mouth daily.    [name] OVER THE COUNTER MEDICATION Primrose oil    . Potassium 99 MG TABS Take 99 mg by mouth daily.     No current facility-administered medications on file prior to visit.     Objective:  Objective  Physical Exam Constitutional:      General: She is not in acute distress.    Appearance: Normal appearance. She is not ill-appearing or toxic-appearing.  HENT:     Head: Normocephalic and atraumatic.     Right Ear: Tympanic membrane, ear canal and external ear normal.     Left Ear: Tympanic membrane, ear  canal and external ear normal.     Nose: No congestion or rhinorrhea.  Eyes:     Extraocular Movements: Extraocular movements intact.     Pupils: Pupils are equal, round, and reactive to light.  Cardiovascular:     Rate and Rhythm: Normal rate and regular rhythm.     Pulses: Normal pulses.     Heart sounds: Normal heart sounds. No murmur heard.   Pulmonary:     Effort: Pulmonary effort is normal. No respiratory distress.     Breath sounds: Normal breath sounds. No wheezing, rhonchi or rales.  Abdominal:     General: Bowel sounds are normal.     Palpations: Abdomen is soft. There is no mass.     Tenderness: There is no abdominal tenderness. There is no guarding.     Hernia: No hernia is present.  Musculoskeletal:        General: Normal range of motion.     Cervical back: Normal range of motion and neck supple.  Skin:    General: Skin is warm and dry.  Neurological:     Mental Status: She is alert and oriented to person, place, and time.  Psychiatric:        Behavior: Behavior normal.    BP 126/80 (BP Location: Left Arm, Patient Position: Sitting, Cuff Size: Normal)   Pulse 78   Temp 98.1 F (36.7 C) (Oral)   Resp 18   Ht 5\' 6"  (1.676 m)   Wt 193 lb (87.5 kg)   SpO2 96%   BMI 31.15 kg/m  Wt Readings from Last 3 Encounters:  05/20/20 193 lb (87.5 kg)  10/03/19 196 lb (88.9 kg)  06/26/18 206 lb (93.4 kg)     Lab Results  Component Value Date   WBC 6.1 10/16/2018   HGB 13.3 10/16/2018   HCT 40.4 10/16/2018   PLT 256 10/16/2018   GLUCOSE 115 (H) 02/11/2020   CHOL 330 (H) 02/11/2020   TRIG 349.0 (H) 02/11/2020   HDL 37.90 (L) 02/11/2020   LDLDIRECT 108.0 02/11/2020   LDLCALC 262 (H) 06/05/2013   ALT 17 02/11/2020   AST 13 02/11/2020   NA 134 (L) 02/11/2020   K 4.2 02/11/2020   CL 95 (L) 02/11/2020   CREATININE 1.38 (H) 02/11/2020   BUN 25 (H) 02/11/2020   CO2 30 02/11/2020   TSH 7.75 (H) 02/11/2020   HGBA1C 6.6 (H) 02/11/2020   MICROALBUR 0.8 07/13/2012     MR Lumbar Spine Wo Contrast  Result Date: 10/29/2018 CLINICAL DATA:  Left lower extremity weakness, bilateral low back pain EXAM: MRI LUMBAR SPINE WITHOUT CONTRAST TECHNIQUE: Multiplanar, multisequence MR imaging of the lumbar spine was performed. No intravenous contrast was administered. COMPARISON:  10/16/2018 CT lumbar spine FINDINGS: Segmentation:  Standard. Alignment:  Physiologic. Vertebrae:  No fracture, evidence of discitis, or bone lesion. Conus medullaris and cauda equina: Conus extends to the L1 level. Conus and cauda equina appear normal. Paraspinal and other soft tissues: No acute paraspinal abnormality. Disc levels: Disc spaces: Disc desiccation throughout the lumbar spine. Mild disc height loss at L4-5 and L5-S1. T12-L1: No significant disc bulge. No evidence of neural foraminal stenosis. No central canal stenosis. L1-L2: Minimal broad-based disc bulge. No evidence of neural foraminal stenosis. No central canal stenosis. L2-L3: Minimal broad-based disc bulge. No evidence of neural foraminal stenosis. No central canal stenosis. L3-L4: Minimal broad-based disc bulge. No evidence of neural foraminal stenosis. No central canal stenosis. L4-L5: Mild broad-based disc bulge flattening the ventral thecal sac eccentric towards the right. Mild bilateral facet arthropathy. Right subarticular cysts recess stenosis. Moderate right foraminal stenosis. Mild spinal stenosis. L5-S1: Mild broad-based disc bulge. Prominent epidural fat along the left paracentral and lateral aspect of the thecal sac. No evidence of neural foraminal stenosis. No central canal stenosis. IMPRESSION: 1.  No acute osseous injury of the lumbar spine. 2. Mild lumbar spine spondylosis as described above. Electronically Signed   By: Kathreen Devoid   On: 10/29/2018 12:01     Assessment & Plan:  Plan    Meds ordered this encounter  Medications  . metoprolol tartrate (LOPRESSOR) 25 MG tablet    Sig: Take 1 tablet (25 mg total) by  mouth 2 (two) times daily.    Dispense:  180 tablet    Refill:  1  . venlafaxine XR (EFFEXOR XR) 75 MG 24 hr capsule    Sig: 1 po qd    Dispense:  90 capsule    Refill:  3  . losartan-hydrochlorothiazide (HYZAAR) 100-12.5 MG tablet    Sig: Take 1 tablet daily    Dispense:  90 tablet    Refill:  1  . levothyroxine (SYNTHROID) 150 MCG tablet    Sig: Take 1 tablet (150 mcg total) by mouth daily.    Dispense:  90 tablet    Refill:  1  . gabapentin (NEURONTIN) 100 MG capsule    Sig: Take 1 capsule (100 mg total) by mouth 3 (three) times daily.    Dispense:  270 capsule    Refill:  1  . erythromycin ophthalmic ointment    Sig: Place 1 application into the right eye at bedtime.    Dispense:  10 each    Refill:  0    Problem List Items Addressed This Visit      Unprioritized   Acute bilateral low back pain    Causing sciatic pain F/u neurosurgery      Essential hypertension    Well controlled, no changes to meds. Encouraged heart healthy diet such as the DASH diet and exercise as tolerated.       Relevant Medications   metoprolol tartrate (LOPRESSOR) 25 MG tablet   losartan-hydrochlorothiazide (HYZAAR) 100-12.5 MG tablet   HTN (hypertension)   Relevant Medications   metoprolol tartrate (LOPRESSOR) 25 MG tablet   losartan-hydrochlorothiazide (HYZAAR) 100-12.5 MG tablet   Other Relevant Orders   Microalbumin / creatinine urine ratio   CBC with Differential/Platelet   Hyperlipidemia    Encouraged heart healthy diet, increase exercise, avoid trans fats, consider a krill oil cap daily      Relevant Medications   metoprolol tartrate (LOPRESSOR) 25 MG tablet   losartan-hydrochlorothiazide (HYZAAR) 100-12.5 MG tablet   Hypothyroidism - Primary    Lab Results  Component Value Date   TSH  7.75 (H) 02/11/2020   Cont meds Check labs today      Relevant Medications   metoprolol tartrate (LOPRESSOR) 25 MG tablet   levothyroxine (SYNTHROID) 150 MCG tablet   Other Relevant  Orders   TSH   Psoriatic arthritis ([place])   Relevant Medications   gabapentin (NEURONTIN) 100 MG capsule    Other Visit Diagnoses    Uncontrolled type 2 diabetes mellitus with hyperglycemia (HCC)       Relevant Medications   losartan-hydrochlorothiazide (HYZAAR) 100-12.5 MG tablet   Other Relevant Orders   Hemoglobin A1c   Comprehensive metabolic panel   Microalbumin / creatinine urine ratio   CBC with Differential/Platelet   Hyperlipidemia associated with type 2 diabetes mellitus (HCC)       Relevant Medications   losartan-hydrochlorothiazide (HYZAAR) 100-12.5 MG tablet   Other Relevant Orders   Lipid panel   Microalbumin / creatinine urine ratio   CBC with Differential/Platelet   Depression with anxiety       Relevant Medications   venlafaxine XR (EFFEXOR XR) 75 MG 24 hr capsule   Vitamin B12 deficiency       Relevant Orders   Vitamin B12   Hordeolum externum of right upper eyelid       Relevant Medications   erythromycin ophthalmic ointment   Estrogen deficiency       Relevant Orders   DG Bone Density      Follow-up: Return in about 3 months (around 08/19/2020), or if symptoms worsen or fail to improve, for hypertension, hyperlipidemia, diabetes II.   I,David Hanna,acting as a Education administrator for [employer], DO.,have documented all relevant documentation on the behalf of Samantha Held, DO,as directed by  Samantha Held, DO while in the presence of [place], DO, have reviewed all documentation for this visit. The documentation on 05/20/20 for the exam, diagnosis, procedures, and orders are all accurate and complete.

## 2020-05-20 NOTE — Assessment & Plan Note (Signed)
Causing sciatic pain F/u neurosurgery

## 2020-05-20 NOTE — Assessment & Plan Note (Signed)
Well controlled, no changes to meds. Encouraged heart healthy diet such as the DASH diet and exercise as tolerated.  °

## 2020-05-20 NOTE — Assessment & Plan Note (Signed)
Lab Results  Component Value Date   TSH 7.75 (H) 02/11/2020   Cont meds Check labs today

## 2020-05-27 ENCOUNTER — Other Ambulatory Visit: Payer: Self-pay

## 2020-05-27 MED ORDER — SIMVASTATIN 20 MG PO TABS: 20.0000 mg | ORAL_TABLET | Freq: Every day | ORAL | 3 refills | Status: DC

## 2020-08-18 ENCOUNTER — Other Ambulatory Visit: Payer: Self-pay | Admitting: Family Medicine

## 2020-12-03 ENCOUNTER — Ambulatory Visit (INDEPENDENT_AMBULATORY_CARE_PROVIDER_SITE_OTHER): Payer: Medicare HMO

## 2020-12-03 VITALS — Ht 66.0 in | Wt 193.0 lb

## 2020-12-03 DIAGNOSIS — Z1231 Encounter for screening mammogram for malignant neoplasm of breast: Secondary | ICD-10-CM

## 2020-12-03 DIAGNOSIS — Z1211 Encounter for screening for malignant neoplasm of colon: Secondary | ICD-10-CM

## 2020-12-03 DIAGNOSIS — Z122 Encounter for screening for malignant neoplasm of respiratory organs: Secondary | ICD-10-CM | POA: Diagnosis not present

## 2020-12-03 DIAGNOSIS — Z0000 Encounter for general adult medical examination without abnormal findings: Secondary | ICD-10-CM | POA: Diagnosis not present

## 2020-12-03 DIAGNOSIS — Z780 Asymptomatic menopausal state: Secondary | ICD-10-CM

## 2020-12-03 NOTE — Patient Instructions (Signed)
Samantha Hanson , Thank you for taking time to complete your Medicare Wellness Visit. I appreciate your ongoing commitment to your health goals. Please review the following plan we discussed and let me know if I can assist you in the future.   Screening recommendations/referrals: Colonoscopy: Cologuard ordered today. Mammogram: Ordered today. Someone will call you to schedule. Bone Density: Ordered today. Someone will call you to schedule. Recommended yearly ophthalmology/optometry visit for glaucoma screening and checkup Recommended yearly dental visit for hygiene and checkup  Vaccinations: Influenza vaccine: Up to date Pneumococcal vaccine: Due-May obtain vaccine at our office or your local pharmacy. Tdap vaccine: Discuss with pharmacy Shingles vaccine: Discuss with pharmacy   Covid-19:Up to date  Advanced directives: Information mailed today  Conditions/risks identified: See problem list  Next appointment: Follow up in one year for your annual wellness visit    Preventive Care 65 Years and Older, Female Preventive care refers to lifestyle choices and visits with your health care provider that can promote health and wellness. What does preventive care include? A yearly physical exam. This is also called an annual well check. Dental exams once or twice a year. Routine eye exams. Ask your health care provider how often you should have your eyes checked. Personal lifestyle choices, including: Daily care of your teeth and gums. Regular physical activity. Eating a healthy diet. Avoiding tobacco and drug use. Limiting alcohol use. Practicing safe sex. Taking low-dose aspirin every day. Taking vitamin and mineral supplements as recommended by your health care provider. What happens during an annual well check? The services and screenings done by your health care provider during your annual well check will depend on your age, overall health, lifestyle risk factors, and family history of  disease. Counseling  Your health care provider may ask you questions about your: Alcohol use. Tobacco use. Drug use. Emotional well-being. Home and relationship well-being. Sexual activity. Eating habits. History of falls. Memory and ability to understand (cognition). Work and work Statistician. Reproductive health. Screening  You may have the following tests or measurements: Height, weight, and BMI. Blood pressure. Lipid and cholesterol levels. These may be checked every 5 years, or more frequently if you are over 5 years old. Skin check. Lung cancer screening. You may have this screening every year starting at age 45 if you have a 30-pack-year history of smoking and currently smoke or have quit within the past 15 years. Fecal occult blood test (FOBT) of the stool. You may have this test every year starting at age 43. Flexible sigmoidoscopy or colonoscopy. You may have a sigmoidoscopy every 5 years or a colonoscopy every 10 years starting at age 72. Hepatitis C blood test. Hepatitis B blood test. Sexually transmitted disease (STD) testing. Diabetes screening. This is done by checking your blood sugar (glucose) after you have not eaten for a while (fasting). You may have this done every 1-3 years. Bone density scan. This is done to screen for osteoporosis. You may have this done starting at age 35. Mammogram. This may be done every 1-2 years. Talk to your health care provider about how often you should have regular mammograms. Talk with your health care provider about your test results, treatment options, and if necessary, the need for more tests. Vaccines  Your health care provider may recommend certain vaccines, such as: Influenza vaccine. This is recommended every year. Tetanus, diphtheria, and acellular pertussis (Tdap, Td) vaccine. You may need a Td booster every 10 years. Zoster vaccine. You may need this after age  60. Pneumococcal 13-valent conjugate (PCV13) vaccine. One  dose is recommended after age 12. Pneumococcal polysaccharide (PPSV23) vaccine. One dose is recommended after age 80. Talk to your health care provider about which screenings and vaccines you need and how often you need them. This information is not intended to replace advice given to you by your health care provider. Make sure you discuss any questions you have with your health care provider. Document Released: 02/21/2015 Document Revised: 10/15/2015 Document Reviewed: 11/26/2014 Elsevier Interactive Patient Education  2017 [employer] Prevention in the Home Falls can cause injuries. They can happen to people of all ages. There are many things you can do to make your home safe and to help prevent falls. What can I do on the outside of my home? Regularly fix the edges of walkways and driveways and fix any cracks. Remove anything that might make you trip as you walk through a door, such as a raised step or threshold. Trim any bushes or trees on the path to your home. Use bright outdoor lighting. Clear any walking paths of anything that might make someone trip, such as rocks or tools. Regularly check to see if handrails are loose or broken. Make sure that both sides of any steps have handrails. Any raised decks and porches should have guardrails on the edges. Have any leaves, snow, or ice cleared regularly. Use sand or salt on walking paths during winter. Clean up any spills in your garage right away. This includes oil or grease spills. What can I do in the bathroom? Use night lights. Install grab bars by the toilet and in the tub and shower. Do not use towel bars as grab bars. Use non-skid mats or decals in the tub or shower. If you need to sit down in the shower, use a plastic, non-slip stool. Keep the floor dry. Clean up any water that spills on the floor as soon as it happens. Remove soap buildup in the tub or shower regularly. Attach bath mats securely with double-sided  non-slip rug tape. Do not have throw rugs and other things on the floor that can make you trip. What can I do in the bedroom? Use night lights. Make sure that you have a light by your bed that is easy to reach. Do not use any sheets or blankets that are too big for your bed. They should not hang down onto the floor. Have a firm chair that has side arms. You can use this for support while you get dressed. Do not have throw rugs and other things on the floor that can make you trip. What can I do in the kitchen? Clean up any spills right away. Avoid walking on wet floors. Keep items that you use a lot in easy-to-reach places. If you need to reach something above you, use a strong step stool that has a grab bar. Keep electrical cords out of the way. Do not use floor polish or wax that makes floors slippery. If you must use wax, use non-skid floor wax. Do not have throw rugs and other things on the floor that can make you trip. What can I do with my stairs? Do not leave any items on the stairs. Make sure that there are handrails on both sides of the stairs and use them. Fix handrails that are broken or loose. Make sure that handrails are as long as the stairways. Check any carpeting to make sure that it is firmly attached to the stairs. Fix  any carpet that is loose or worn. Avoid having throw rugs at the top or bottom of the stairs. If you do have throw rugs, attach them to the floor with carpet tape. Make sure that you have a light switch at the top of the stairs and the bottom of the stairs. If you do not have them, ask someone to add them for you. What else can I do to help prevent falls? Wear shoes that: Do not have high heels. Have rubber bottoms. Are comfortable and fit you well. Are closed at the toe. Do not wear sandals. If you use a stepladder: Make sure that it is fully opened. Do not climb a closed stepladder. Make sure that both sides of the stepladder are locked into place. Ask  someone to hold it for you, if possible. Clearly mark and make sure that you can see: Any grab bars or handrails. First and last steps. Where the edge of each step is. Use tools that help you move around (mobility aids) if they are needed. These include: Canes. Walkers. Scooters. Crutches. Turn on the lights when you go into a dark area. Replace any light bulbs as soon as they burn out. Set up your furniture so you have a clear path. Avoid moving your furniture around. If any of your floors are uneven, fix them. If there are any pets around you, be aware of where they are. Review your medicines with your doctor. Some medicines can make you feel dizzy. This can increase your chance of falling. Ask your doctor what other things that you can do to help prevent falls. This information is not intended to replace advice given to you by your health care provider. Make sure you discuss any questions you have with your health care provider. Document Released: 11/21/2008 Document Revised: 07/03/2015 Document Reviewed: 03/01/2014 Elsevier Interactive Patient Education  2017 [employer].

## 2020-12-03 NOTE — Progress Notes (Signed)
Subjective:   Samantha Hanson is a 70 y.o. female who presents for Medicare Annual (Subsequent) preventive examination.  I connected with Syeda today by telephone and verified that I am speaking with the correct person using two identifiers. Location patient: home Location provider: work Persons participating in the virtual visit: patient, Marine scientist.    I discussed the limitations, risks, security and privacy concerns of performing an evaluation and management service by telephone and the availability of in person appointments. I also discussed with the patient that there may be a patient responsible charge related to this service. The patient expressed understanding and verbally consented to this telephonic visit.    Interactive audio and video telecommunications were attempted between this provider and patient, however failed, due to patient having technical difficulties OR patient did not have access to video capability.  We continued and completed visit with audio only.  Some vital signs may be absent or patient reported.   Time Spent with patient on telephone encounter: 40 minutes   Review of Systems     Cardiac Risk Factors include: advanced age (>50men, >24 women);dyslipidemia;hypertension;obesity (BMI >30kg/m2);sedentary lifestyle;smoking/ tobacco exposure     Objective:    Today's Vitals   12/03/20 1500  Weight: 193 lb (87.5 kg)  Height: 5\' 6"  (1.676 m)  PainSc: 5    Body mass index is 31.15 kg/m.  Advanced Directives 12/03/2020 07/01/2015  Does Patient Have a Medical Advance Directive? No No  Does patient want to make changes to medical advance directive? - No - Patient declined  Copy of [place] in Chart? - No - copy requested  Would patient like information on creating a medical advance directive? Yes (MAU/Ambulatory/Procedural Areas - Information given) Yes - Educational materials given    Current Medications (verified) Outpatient Encounter  Medications as of 12/03/2020  Medication Sig   ALPRAZolam (XANAX) 0.5 MG tablet 1 po 30 min prior to procedure and tid prn   aspirin 325 MG EC tablet Take 325-650 mg by mouth every 6 (six) hours as needed for pain.   calcium carbonate 1250 MG capsule Take 1,250 mg by mouth daily.   Cholecalciferol (VITAMIN D) 2000 UNITS CAPS Take by mouth.   Coenzyme Q10 (CO Q-10 PO) Take by mouth.   Cyanocobalamin (B-12 PO) Take 1 tablet by mouth daily.   erythromycin ophthalmic ointment Place 1 application into the right eye at bedtime.   fish oil-omega-3 fatty acids 1000 MG capsule Take 1 g by mouth daily.    gabapentin (NEURONTIN) 100 MG capsule Take 1 capsule (100 mg total) by mouth 3 (three) times daily.   glucosamine-chondroitin 500-400 MG tablet Take 1 tablet by mouth 3 (three) times daily.   HYDROcodone-acetaminophen (NORCO) 5-325 MG tablet Take 1 tablet by mouth every 6 (six) hours as needed.   lansoprazole (PREVACID) 30 MG capsule Take 1 capsule (30 mg total) by mouth daily as needed.   levothyroxine (SYNTHROID) 150 MCG tablet Take 1 tablet (150 mcg total) by mouth daily.   losartan-hydrochlorothiazide (HYZAAR) 100-12.5 MG tablet Take 1 tablet daily   methocarbamol (ROBAXIN) 500 MG tablet Take 1 tablet (500 mg total) by mouth 2 (two) times daily.   metoprolol tartrate (LOPRESSOR) 25 MG tablet Take 1 tablet (25 mg total) by mouth 2 (two) times daily.   Multiple Vitamins-Minerals (MULTIVITAMIN PO) Take 1 tablet by mouth daily.   OVER THE COUNTER MEDICATION Primrose oil   Potassium 99 MG TABS Take 99 mg by mouth daily.   simvastatin (  ZOCOR) 20 MG tablet TAKE 1 TABLET AT BEDTIME   venlafaxine XR (EFFEXOR XR) 75 MG 24 hr capsule 1 po qd   fenofibrate 160 MG tablet TAKE 1 TABLET ONE TIME DAILY WITH FOOD (NEED AN APPOINTMENT IN DECEMBER) (Patient not taking: Reported on 12/03/2020)   No facility-administered encounter medications on file as of 12/03/2020.    Allergies (verified) Patient has no known  allergies.   History: Past Medical History:  Diagnosis Date   Hyperlipidemia    Migraine    MVP (mitral valve prolapse)    Thyroid disease    Past Surgical History:  Procedure Laterality Date   BUNIONECTOMY     Right foot   Family History  Problem Relation Age of Onset   Coronary artery disease Father    Arthritis Father    Heart disease Father        heart transplant candidate but too old--per pt   Thyroid cancer Mother    Osteoporosis Sister    Social History   Socioeconomic History   Marital status: Married    Spouse name: Not on file   Number of children: Not on file   Years of education: Not on file   Highest education level: Not on file  Occupational History   Not on file  Tobacco Use   Smoking status: Every Day    Packs/day: 1.50    Years: 50.00    Pack years: 75.00    Types: Cigarettes   Smokeless tobacco: Never  Substance and Sexual Activity   Alcohol use: No   Drug use: No   Sexual activity: Not Currently  Other Topics Concern   Not on file  Social History Narrative   Not on file   Social Determinants of Health   Financial Resource Strain: Low Risk    Difficulty of Paying Living Expenses: Not hard at all  Food Insecurity: No Food Insecurity   Worried About Charity fundraiser in the Last Year: Never true   Ran Out of Food in the Last Year: Never true  Transportation Needs: No Transportation Needs   Lack of Transportation (Medical): No   Lack of Transportation (Non-Medical): No  Physical Activity: Inactive   Days of Exercise per Week: 0 days   Minutes of Exercise per Session: 0 min  Stress: Not on file  Social Connections: Socially Isolated   Frequency of Communication with Friends and Family: Once a week   Frequency of Social Gatherings with Friends and Family: Once a week   Attends Religious Services: Never   Marine scientist or Organizations: No   Attends Music therapist: Never   Marital Status: Married     Tobacco Counseling Ready to quit: Not Answered Counseling given: Not Answered   Clinical Intake:  Pre-visit preparation completed: Yes  Pain : 0-10 Pain Score: 5  Pain Type: Chronic pain Pain Location: Back Pain Onset: More than a month ago Pain Frequency: Constant     BMI - recorded: 31.15 Nutritional Status: BMI > 30  Obese Nutritional Risks: None Diabetes: No  How often do you need to have someone help you when you read instructions, pamphlets, or other written materials from your doctor or pharmacy?: 1 - Never  Diabetic?No  Interpreter Needed?: No  Information entered by :: Caroleen Hamman LPN   Activities of Daily Living In your present state of health, do you have any difficulty performing the following activities: 12/03/2020  Hearing? N  Vision? N  Difficulty concentrating  or making decisions? Y  Comment occasionally  Walking or climbing stairs? N  Dressing or bathing? N  Doing errands, shopping? N  Preparing Food and eating ? N  Using the Toilet? N  In the past six months, have you accidently leaked urine? Y  Do you have problems with loss of bowel control? N  Managing your Medications? N  Managing your Finances? N  Housekeeping or managing your Housekeeping? Y  Some recent data might be hidden    Patient Care Team: Carollee Herter, Alferd Apa, DO as PCP - General  Indicate any recent Medical Services you may have received from other than [facility] providers in the past year (date may be approximate).     Assessment:   This is a routine wellness examination for Savayah.  Hearing/Vision screen Hearing Screening - Comments:: C/o mild hearing loss Vision Screening - Comments:: Reading glasses Last eye exam-many years ago  Dietary issues and exercise activities discussed: Current Exercise Habits: The patient does not participate in regular exercise at present, Exercise limited by: orthopedic condition(s)   Goals Addressed             This Visit's  Progress    Patient Stated       Drink more water       Depression Screen PHQ 2/9 Scores 12/03/2020 05/20/2020 07/01/2015 08/06/2014 07/13/2012  PHQ - 2 Score 0 0 0 2 4  PHQ- 9 Score - 6 - - 17    Fall Risk Fall Risk  12/03/2020 05/20/2020 07/01/2015 08/06/2014 07/13/2012  Falls in the past year? 1 0 Yes No No  Number falls in past yr: 0 0 1 - -  Injury with Fall? 0 0 No - -  Risk for fall due to : History of fall(s) - Other (Comment) - -  Risk for fall due to: Comment - - tripped over cats - -  Follow up Falls prevention discussed - Falls prevention discussed - -    FALL RISK PREVENTION PERTAINING TO THE HOME:  Any stairs in or around the home? Yes  If so, are there any without handrails? No  Home free of loose throw rugs in walkways, pet beds, electrical cords, etc? Yes  Adequate lighting in your home to reduce risk of falls? Yes   ASSISTIVE DEVICES UTILIZED TO PREVENT FALLS:  Life alert? No  Use of a cane, walker or w/c? No  Grab bars in the bathroom? No  Shower chair or bench in shower? Yes  Elevated toilet seat or a handicapped toilet? No   TIMED UP AND GO:  Was the test performed? No . Phone visit   Cognitive Function:Normal cognitive status assessed by this Nurse Health Advisor. No abnormalities found.          Immunizations Immunization History  Administered Date(s) Administered   Influenza, High Dose Seasonal PF 12/08/2013, 11/27/2014   Influenza,inj,Quad PF,6+ Mos 12/06/2012   Influenza-Unspecified 10/10/2015, 12/02/2016, 11/19/2020   PFIZER(Purple Top)SARS-COV-2 Vaccination 03/15/2019, 04/09/2019   Pfizer Covid-19 Vaccine Bivalent Booster 37yrs & up 11/19/2020   Pneumococcal Conjugate-13 08/06/2014   Td 08/19/2004   Zoster, Live 08/27/2010    TDAP status: Due, Education has been provided regarding the importance of this vaccine. Advised may receive this vaccine at local pharmacy or Health Dept. Aware to provide a copy of the vaccination record if obtained  from local pharmacy or Health Dept. Verbalized acceptance and understanding.  Flu Vaccine status: Up to date  Pneumococcal vaccine status: Due, Education has been provided  regarding the importance of this vaccine. Advised may receive this vaccine at local pharmacy or Health Dept. Aware to provide a copy of the vaccination record if obtained from local pharmacy or Health Dept. Verbalized acceptance and understanding.  Covid-19 vaccine status: Completed vaccines  Qualifies for Shingles Vaccine? Yes   Zostavax completed Yes   Shingrix Completed?: No.    Education has been provided regarding the importance of this vaccine. Patient has been advised to call insurance company to determine out of pocket expense if they have not yet received this vaccine. Advised may also receive vaccine at local pharmacy or Health Dept. Verbalized acceptance and understanding.  Screening Tests Health Maintenance  Topic Date Due   Hepatitis C Screening  Never done   COLONOSCOPY (Pts 45-12yrs Insurance coverage will need to be confirmed)  Never done   Zoster Vaccines- Shingrix (1 of 2) Never done   MAMMOGRAM  03/05/2012   TETANUS/TDAP  08/20/2014   Pneumonia Vaccine 86+ Years old (2 - PPSV23 if available, else PCV20) 08/06/2015   INFLUENZA VACCINE  Completed   DEXA SCAN  Completed   COVID-19 Vaccine  Completed   HPV VACCINES  Aged Out    Health Maintenance  Health Maintenance Due  Topic Date Due   Hepatitis C Screening  Never done   COLONOSCOPY (Pts 45-92yrs Insurance coverage will need to be confirmed)  Never done   Zoster Vaccines- Shingrix (1 of 2) Never done   MAMMOGRAM  03/05/2012   TETANUS/TDAP  08/20/2014   Pneumonia Vaccine 75+ Years old (2 - PPSV23 if available, else PCV20) 08/06/2015   Colorectal cancer screening: Cologuard ordered today.  Mammogram status: Ordered today. Pt provided with contact info and advised to call to schedule appt.   Bone Density status: Ordered today. Pt provided  with contact info and advised to call to schedule appt.  Lung Cancer Screening: (Low Dose CT Chest recommended if Age 2-80 years, 30 pack-year currently smoking OR have quit w/in 15years.) does qualify.   Lung Cancer Screening Referral: ordered today  Additional Screening:  Hepatitis C Screening: does qualify; Discuss with PCP  Vision Screening: Recommended annual ophthalmology exams for early detection of glaucoma and other disorders of the eye. Is the patient up to date with their annual eye exam?  No  Who is the provider or what is the name of the office in which the patient attends annual eye exams? None-patient plans to make an appt soon   Dental Screening: Recommended annual dental exams for proper oral hygiene  Community Resource Referral / Chronic Care Management: CRR required this visit?  No   CCM required this visit?  No      Plan:     I have personally reviewed and noted the following in the patient's chart:   Medical and social history Use of alcohol, tobacco or illicit drugs  Current medications and supplements including opioid prescriptions.  Functional ability and status Nutritional status Physical activity Advanced directives List of other physicians Hospitalizations, surgeries, and ER visits in previous 12 months Vitals Screenings to include cognitive, depression, and falls Referrals and appointments  In addition, I have reviewed and discussed with patient certain preventive protocols, quality metrics, and best practice recommendations. A written personalized care plan for preventive services as well as general preventive health recommendations were provided to patient.   Due to this being a telephonic visit, the after visit summary with patients personalized plan was offered to patient via mail or my-chart.Patient would like to access  on my-chart.  Marta Antu, LPN   [id number]  Nurse Health Advisor  Nurse Notes: None

## 2020-12-18 ENCOUNTER — Telehealth: Payer: Self-pay | Admitting: Family Medicine

## 2020-12-18 NOTE — Telephone Encounter (Signed)
Spoke with patient. Pt currently at [facility] and is having imaging

## 2020-12-18 NOTE — Telephone Encounter (Signed)
Patient Name: Samantha Hanson Gender: Female DOB: 1950-09-11 Client [facility] - Client Client Site [facility] - Day Physician Roma Schanz- MD Contact Type Call Who Is Calling Patient / Member / Family / Caregiver Call Type Triage / Clinical Relationship To Patient Self Return Phone Number [phone] (Primary) Chief Complaint Foot Pain Reason for Call Symptomatic / Request for [name] states she has fallen yesterday and hut her foot. Caller states she can not put weight on the foot and it is painful. Caller would like to know what to do. [place] Not Listed UC Translation No Nurse Assessment Nurse: Markus Daft, RN, [place] Date/Time (Eastern Time): 12/18/2020 12:22:40 PM Confirm and document reason for call. If symptomatic, describe symptoms. ---Caller states she had fallen yesterday on a flat surface when she tripped over an object, and injuring the right foot. She can bare weight now, but it is very painful. Thought it was a sprain, and bruised and slightly swollen. Paramedics were calls, vitals slightly elevated, and checked her spine that she initially had stabbing pain in. No back pain now. Does the patient have any new or worsening symptoms? ---Yes Will a triage be completed? ---Yes Related visit to physician within the last 2 weeks? ---No Does the PT have any chronic conditions? (i.e. diabetes, asthma, this includes High risk factors for pregnancy, etc.) ---Yes List chronic conditions. ---hypothyroid, depression, HTN Is this a behavioral health or substance abuse call? ---No Guidelines Guideline Title Affirmed Question Affirmed Notes Nurse Date/Time Eilene Ghazi Time) Ankle and Foot Injury [1] Limp when walking AND [2] due to a twisted ankle or foot Markus Daft, RN, [facility] 12/18/2020 12:27:14 PM

## 2020-12-18 NOTE — Telephone Encounter (Signed)
Patient called asking for an appointment for her foot, stating she had fallen and her foot was it is blue and hurting and would like to see a foot specialist. Upon getting more information she informed me that she had called the paramedics but they told her both [facility] and [facility] had long wait time so she declined going with the. She was triaged for further assistance.

## 2020-12-20 ENCOUNTER — Encounter: Payer: Self-pay | Admitting: Family Medicine

## 2020-12-20 LAB — COLOGUARD: COLOGUARD: NEGATIVE

## 2020-12-22 ENCOUNTER — Other Ambulatory Visit: Payer: Self-pay | Admitting: Family Medicine

## 2020-12-22 DIAGNOSIS — I10 Essential (primary) hypertension: Secondary | ICD-10-CM

## 2020-12-25 ENCOUNTER — Ambulatory Visit: Payer: Medicare HMO | Admitting: Family Medicine

## 2020-12-30 ENCOUNTER — Telehealth: Payer: Self-pay | Admitting: Family Medicine

## 2020-12-30 ENCOUNTER — Emergency Department (HOSPITAL_BASED_OUTPATIENT_CLINIC_OR_DEPARTMENT_OTHER): Payer: Medicare HMO

## 2020-12-30 ENCOUNTER — Encounter: Payer: Self-pay | Admitting: Family Medicine

## 2020-12-30 ENCOUNTER — Ambulatory Visit (INDEPENDENT_AMBULATORY_CARE_PROVIDER_SITE_OTHER): Payer: Medicare HMO | Admitting: Family Medicine

## 2020-12-30 ENCOUNTER — Encounter (HOSPITAL_BASED_OUTPATIENT_CLINIC_OR_DEPARTMENT_OTHER): Payer: Self-pay | Admitting: *Deleted

## 2020-12-30 ENCOUNTER — Other Ambulatory Visit: Payer: Self-pay

## 2020-12-30 ENCOUNTER — Other Ambulatory Visit (HOSPITAL_BASED_OUTPATIENT_CLINIC_OR_DEPARTMENT_OTHER): Payer: Self-pay

## 2020-12-30 ENCOUNTER — Emergency Department (HOSPITAL_BASED_OUTPATIENT_CLINIC_OR_DEPARTMENT_OTHER)
Admission: EM | Admit: 2020-12-30 | Discharge: 2020-12-30 | Disposition: A | Discharge status: Home / Self Care | Payer: Medicare HMO | Attending: Emergency Medicine | Admitting: Emergency Medicine

## 2020-12-30 VITALS — BP 180/90 | HR 97 | Temp 98.0°F | Resp 18 | Ht 66.0 in | Wt 192.2 lb

## 2020-12-30 DIAGNOSIS — R2241 Localized swelling, mass and lump, right lower limb: Secondary | ICD-10-CM | POA: Diagnosis not present

## 2020-12-30 DIAGNOSIS — I4891 Unspecified atrial fibrillation: Secondary | ICD-10-CM | POA: Diagnosis not present

## 2020-12-30 DIAGNOSIS — R002 Palpitations: Secondary | ICD-10-CM

## 2020-12-30 DIAGNOSIS — I10 Essential (primary) hypertension: Secondary | ICD-10-CM

## 2020-12-30 DIAGNOSIS — Z79899 Other long term (current) drug therapy: Secondary | ICD-10-CM | POA: Insufficient documentation

## 2020-12-30 DIAGNOSIS — I480 Paroxysmal atrial fibrillation: Secondary | ICD-10-CM | POA: Insufficient documentation

## 2020-12-30 DIAGNOSIS — Z7901 Long term (current) use of anticoagulants: Secondary | ICD-10-CM | POA: Diagnosis not present

## 2020-12-30 DIAGNOSIS — R0789 Other chest pain: Secondary | ICD-10-CM | POA: Diagnosis not present

## 2020-12-30 DIAGNOSIS — J441 Chronic obstructive pulmonary disease with (acute) exacerbation: Secondary | ICD-10-CM | POA: Insufficient documentation

## 2020-12-30 DIAGNOSIS — E039 Hypothyroidism, unspecified: Secondary | ICD-10-CM | POA: Diagnosis not present

## 2020-12-30 DIAGNOSIS — R000 Tachycardia, unspecified: Secondary | ICD-10-CM | POA: Diagnosis not present

## 2020-12-30 DIAGNOSIS — F17210 Nicotine dependence, cigarettes, uncomplicated: Secondary | ICD-10-CM | POA: Insufficient documentation

## 2020-12-30 HISTORY — DX: Essential (primary) hypertension: I10

## 2020-12-30 LAB — BASIC METABOLIC PANEL
Anion gap: 13 (ref 5–15)
BUN: 18 mg/dL (ref 8–23)
CO2: 22 mmol/L (ref 22–32)
Calcium: 10.1 mg/dL (ref 8.9–10.3)
Chloride: 93 mmol/L — ABNORMAL LOW (ref 98–111)
Creatinine, Ser: 0.89 mg/dL (ref 0.44–1.00)
GFR, Estimated: >60 mL/min (ref >60)
Glucose, Bld: 117 mg/dL — ABNORMAL HIGH (ref 70–99)
Potassium: 3.6 mmol/L (ref 3.5–5.1)
Sodium: 128 mmol/L — ABNORMAL LOW (ref 135–145)

## 2020-12-30 LAB — CBC
HCT: 41.7 % (ref 36.0–46.0)
Hemoglobin: 13.8 g/dL (ref 12.0–15.0)
MCH: 28.6 pg (ref 26.0–34.0)
MCHC: 33.1 g/dL (ref 30.0–36.0)
MCV: 86.5 fL (ref 80.0–100.0)
Platelets: 262 10*3/uL (ref 150–400)
RBC: 4.82 MIL/uL (ref 3.87–5.11)
RDW: 14 % (ref 11.5–15.5)
WBC: 6.9 10*3/uL (ref 4.0–10.5)
nRBC: 0 % (ref 0.0–0.2)

## 2020-12-30 LAB — MAGNESIUM: Magnesium: 1.6 mg/dL — ABNORMAL LOW (ref 1.7–2.4)

## 2020-12-30 LAB — TROPONIN I (HIGH SENSITIVITY)
Troponin I (High Sensitivity): 4 ng/L (ref <18)
Troponin I (High Sensitivity): 6 ng/L (ref <18)

## 2020-12-30 LAB — D-DIMER, QUANTITATIVE: D-Dimer, Quant: 0.9 ug/mL-FEU — ABNORMAL HIGH (ref 0.00–0.50)

## 2020-12-30 MED ORDER — IOHEXOL 350 MG/ML SOLN
100.0000 mL | Freq: Once | INTRAVENOUS | Status: AC | PRN
  Administered 2020-12-30: 100 mL via INTRAVENOUS

## 2020-12-30 MED ORDER — METOPROLOL TARTRATE 25 MG PO TABS: 25.0000 mg | ORAL_TABLET | Freq: Two times a day (BID) | ORAL | 0 refills | Status: DC

## 2020-12-30 MED ORDER — SODIUM CHLORIDE 0.9 % IV BOLUS
1000.0000 mL | Freq: Once | INTRAVENOUS | Status: AC
  Administered 2020-12-30: 1000 mL via INTRAVENOUS

## 2020-12-30 MED ORDER — APIXABAN 5 MG PO TABS
5.0000 mg | ORAL_TABLET | Freq: Two times a day (BID) | ORAL | 0 refills | Status: DC
Start: 2020-12-30 — End: 2021-01-07

## 2020-12-30 MED ORDER — MAGNESIUM SULFATE 2 GM/50ML IV SOLN
2.0000 g | Freq: Once | INTRAVENOUS | Status: AC
  Administered 2020-12-30: 2 g via INTRAVENOUS
  Filled 2020-12-30: qty 50

## 2020-12-30 MED ORDER — METOPROLOL TARTRATE 25 MG PO TABS
25.0000 mg | ORAL_TABLET | Freq: Two times a day (BID) | ORAL | 1 refills | Status: DC
  Filled 2020-12-30: qty 180, 90d supply, fill #0

## 2020-12-30 NOTE — Progress Notes (Signed)
New Patient Office Visit  Subjective:  Patient ID: Samantha Hanson, female    DOB: Jun 28, 1950  Age: 70 y.o. MRN: [id number]  CC:  Chief Complaint  Patient presents with   Chest Pain    Pt states being without blood pressure medicine for 4 days.    Dizziness    HPI Samantha Hanson presents for f/u fractured foot but started having cp in waiting room and felt like she was going to pass out  She ran out of her bp meds last week and it was supposed to be delivered Friday but was not    Past Medical History:  Diagnosis Date   Hyperlipidemia    Hypertension    Migraine    MVP (mitral valve prolapse)    Thyroid disease     Past Surgical History:  Procedure Laterality Date   BUNIONECTOMY     Right foot    Family History  Problem Relation Age of Onset   Coronary artery disease Father    Arthritis Father    Heart disease Father        heart transplant candidate but too old--per pt   Thyroid cancer Mother    Osteoporosis Sister     Social History   Socioeconomic History   Marital status: Married    Spouse name: Not on file   Number of children: Not on file   Years of education: Not on file   Highest education level: Not on file  Occupational History   Not on file  Tobacco Use   Smoking status: Every Day    Packs/day: 1.50    Years: 50.00    Pack years: 75.00    Types: Cigarettes   Smokeless tobacco: Never  Substance and Sexual Activity   Alcohol use: No   Drug use: No   Sexual activity: Not Currently  Other Topics Concern   Not on file  Social History Narrative   Not on file   Social Determinants of Health   Financial Resource Strain: Low Risk    Difficulty of Paying Living Expenses: Not hard at all  Food Insecurity: No Food Insecurity   Worried About Charity fundraiser in the Last Year: Never true   Ran Out of Food in the Last Year: Never true  Transportation Needs: No Transportation Needs   Lack of Transportation (Medical): No   Lack of  Transportation (Non-Medical): No  Physical Activity: Inactive   Days of Exercise per Week: 0 days   Minutes of Exercise per Session: 0 min  Stress: Not on file  Social Connections: Socially Isolated   Frequency of Communication with Friends and Family: Once a week   Frequency of Social Gatherings with Friends and Family: Once a week   Attends Religious Services: Never   Marine scientist or Organizations: No   Attends Music therapist: Never   Marital Status: Married  Human resources officer Violence: Not At Risk   Fear of Current or Ex-Partner: No   Emotionally Abused: No   Physically Abused: No   Sexually Abused: No    ROS Review of Systems  Constitutional:  Negative for activity change, appetite change, fatigue and unexpected weight change.  Respiratory:  Positive for chest tightness. Negative for cough and shortness of breath.   Cardiovascular:  Positive for chest pain. Negative for palpitations.  Neurological:  Positive for dizziness and light-headedness.  Psychiatric/Behavioral:  Negative for behavioral problems and dysphoric mood. The patient is not nervous/anxious.  Objective:   Today's Vitals: BP (!) 180/90 (BP Location: Left Arm, Patient Position: Sitting, Cuff Size: Normal)   Pulse 97   Temp 98 F (36.7 C) (Oral)   Resp 18   Ht 5\' 6"  (1.676 m)   Wt 192 lb 3.2 oz (87.2 kg)   SpO2 97%   BMI 31.02 kg/m   Physical Exam Vitals and nursing note reviewed.  Constitutional:      Appearance: She is well-developed.  HENT:     Head: Normocephalic and atraumatic.  Eyes:     Conjunctiva/sclera: Conjunctivae normal.  Neck:     Thyroid: No thyromegaly.     Vascular: No carotid bruit or JVD.  Cardiovascular:     Rate and Rhythm: Normal rate. Rhythm irregular.     Heart sounds: Normal heart sounds. No murmur heard. Pulmonary:     Effort: Pulmonary effort is normal. No respiratory distress.     Breath sounds: Normal breath sounds. No wheezing or rales.   Chest:     Chest wall: No tenderness.  Musculoskeletal:     Cervical back: Normal range of motion and neck supple.  Neurological:     Mental Status: She is alert and oriented to person, place, and time.    Assessment & Plan:   Problem List Items Addressed This Visit       Unprioritized   Essential hypertension   Atrial fibrillation ([place])    New dx with chest pain D/w with ER Dr Pt doing down to ER for evaluation      Other chest pain - Primary    Pt going to er       Relevant Orders   EKG 12-Lead (Completed)    Outpatient Encounter Medications as of 12/30/2020  Medication Sig   ALPRAZolam (XANAX) 0.5 MG tablet 1 po 30 min prior to procedure and tid prn   calcium carbonate 1250 MG capsule Take 1,250 mg by mouth daily.   Cholecalciferol (VITAMIN D) 2000 UNITS CAPS Take by mouth.   Coenzyme Q10 (CO Q-10 PO) Take by mouth.   Cyanocobalamin (B-12 PO) Take 1 tablet by mouth daily.   erythromycin ophthalmic ointment Place 1 application into the right eye at bedtime.   fish oil-omega-3 fatty acids 1000 MG capsule Take 1 g by mouth daily.    gabapentin (NEURONTIN) 100 MG capsule Take 1 capsule (100 mg total) by mouth 3 (three) times daily.   glucosamine-chondroitin 500-400 MG tablet Take 1 tablet by mouth 3 (three) times daily.   HYDROcodone-acetaminophen (NORCO) 5-325 MG tablet Take 1 tablet by mouth every 6 (six) hours as needed.   lansoprazole (PREVACID) 30 MG capsule Take 1 capsule (30 mg total) by mouth daily as needed.   levothyroxine (SYNTHROID) 150 MCG tablet Take 1 tablet (150 mcg total) by mouth daily.   losartan-hydrochlorothiazide (HYZAAR) 100-12.5 MG tablet TAKE 1 TABLET EVERY DAY   methocarbamol (ROBAXIN) 500 MG tablet Take 1 tablet (500 mg total) by mouth 2 (two) times daily.   Multiple Vitamins-Minerals (MULTIVITAMIN PO) Take 1 tablet by mouth daily.   OVER THE COUNTER MEDICATION Primrose oil   Potassium 99 MG TABS Take 99 mg by mouth daily.   simvastatin  (ZOCOR) 20 MG tablet TAKE 1 TABLET AT BEDTIME   venlafaxine XR (EFFEXOR XR) 75 MG 24 hr capsule 1 po qd   [DISCONTINUED] aspirin 325 MG EC tablet Take 325-650 mg by mouth every 6 (six) hours as needed for pain.   [DISCONTINUED] metoprolol tartrate (LOPRESSOR) 25 MG  tablet TAKE 1 TABLET TWICE DAILY   fenofibrate 160 MG tablet TAKE 1 TABLET ONE TIME DAILY WITH FOOD (NEED AN APPOINTMENT IN DECEMBER) (Patient not taking: Reported on 12/03/2020)   [DISCONTINUED] metoprolol tartrate (LOPRESSOR) 25 MG tablet Take 1 tablet (25 mg total) by mouth 2 (two) times daily.   No facility-administered encounter medications on file as of 12/30/2020.    Follow-up: No follow-ups on file.   Ann Held, DO

## 2020-12-30 NOTE — ED Notes (Signed)
Pt returned from xray and from bathroom.  U/a obtained.  IV fluids restarted

## 2020-12-30 NOTE — ED Triage Notes (Signed)
Pt sent from PMD office for new onset afib , chest pain earlier this am .

## 2020-12-30 NOTE — Discharge Instructions (Addendum)
You need to follow up with the [facility]. You should receive a phone call from then in the next couple of days. If you do not, please call the phone number provided.   Please start taking Eliquis and continue taking this until you are seen by the Atrial Fibrillation providers. At that point, they will determine if you still need to continue taking this.  While you are taking Eliquis, please avoid taking Aspirin and NSAIDs such as Ibuprofen or Naproxen.  Please review the information in your discharge paperwork regarding Eliquis use.

## 2020-12-30 NOTE — Telephone Encounter (Signed)
Pt. Called and asked for refill of BP medication. She states she hasnt taken medication in days and needs a refill sent to a new pharmacy because with her other it will be a few days before it gets here. Pt then started to express about her bp being high, she feels dizzy, heart beating fast and doesn't feel the best. I informed pt. I was going to get her over to a nurse to discuss her symptoms but pt declined 2x because she has an appointment today at 3:20 and she doesn't want to wait on hold to talk to a nurse. She said she will discuss symptoms at her apt at 3:20.

## 2020-12-30 NOTE — ED Notes (Signed)
ED Provider at bedside. 

## 2020-12-30 NOTE — Assessment & Plan Note (Signed)
New dx with chest pain D/w with ER Dr Pt doing down to ER for evaluation

## 2020-12-30 NOTE — ED Notes (Signed)
Pt reports being out of her Metoprolol X4 days r/t waiting for it to come in mail. Went to doctor to try to get it was sent to ED for evaluation.

## 2020-12-30 NOTE — Telephone Encounter (Signed)
Noted  

## 2020-12-30 NOTE — ED Notes (Signed)
Pt is currently getting [place] of leg, will delay Mg infusion

## 2020-12-30 NOTE — ED Provider Notes (Signed)
[facility]PART[facility] Provider Note   CSN: [id number] Arrival date & time: 12/30/20  4818     History Chief Complaint  Patient presents with   Palpitations    Samantha Hanson is a 70 y.o. female.  This is a patient with a past medical history of hypothyroidism, hyperlipidemia, hypertension, mitral valve prolapse she presents today from her doctor's office where she was found to have new onset A. fib RVR.  Patient states that she has been having some intermittent chest pains and shortness of breath today where she felt like she was going to pass out.  She last had this while she is in the waiting room but is now not having symptoms.  She currently is having palpitations and has been having that today have been pretty constant.  He did run out of her metoprolol about 5 days ago because it was supposed to be delivered and for some reason did not get delivered.  She denies any abdominal pain, nausea, vomiting.   Palpitations Associated symptoms: chest pain, dizziness and shortness of breath       Past Medical History:  Diagnosis Date   Hyperlipidemia    Hypertension    Migraine    MVP (mitral valve prolapse)    Thyroid disease     Patient Active Problem List   Diagnosis Date Noted   Other chest pain 12/30/2020   Atrial fibrillation ([place]) 12/30/2020   Acute bilateral low back pain 06/22/2017   Psoriatic arthritis ([place]) 06/22/2017   COPD exacerbation ([place]) 06/22/2017   Aortic atherosclerosis ([place]) 06/22/2017   Falls frequently 03/12/2016   Peripheral neuropathy 07/13/2012   Memory loss 07/13/2012   Abnormal stress test 06/09/2012   Cellulitis and abscess of right scalp 05/09/2012   Sebaceous cyst 05/05/2012   Eczema 05/05/2012   HTN (hypertension) 10/29/2011   Headache(784.0) 10/29/2011   NEVI, MULTIPLE 02/23/2010   PAIN IN JOINT, MULTIPLE SITES 02/23/2010   POSTMENOPAUSAL STATUS 11/12/2008   Essential hypertension 06/27/2007   Hypothyroidism  06/20/2007   Hyperlipidemia 06/20/2007   Anxiety state 06/20/2007   CIGARETTE SMOKER 06/20/2007   ESOPHAGEAL REFLUX 06/20/2007    Past Surgical History:  Procedure Laterality Date   BUNIONECTOMY     Right foot     OB History   No obstetric history on file.     Family History  Problem Relation Age of Onset   Coronary artery disease Father    Arthritis Father    Heart disease Father        heart transplant candidate but too old--per pt   Thyroid cancer Mother    Osteoporosis Sister     Social History   Tobacco Use   Smoking status: Every Day    Packs/day: 1.50    Years: 50.00    Pack years: 75.00    Types: Cigarettes   Smokeless tobacco: Never  Substance Use Topics   Alcohol use: No   Drug use: No    Home Medications Prior to Admission medications   Medication Sig Start Date End Date Taking? Authorizing Provider  apixaban (ELIQUIS) 5 MG TABS tablet Take 1 tablet (5 mg total) by mouth 2 (two) times daily. 12/30/20 01/29/21 Yes Fionnuala Hemmerich, Adora Fridge, PA-C  ALPRAZolam Duanne Moron) 0.5 MG tablet 1 po 30 min prior to procedure and tid prn 10/26/18   Carollee Herter, Alferd Apa, DO  calcium carbonate 1250 MG capsule Take 1,250 mg by mouth daily.    [provider]  Cholecalciferol (VITAMIN D) 2000  UNITS CAPS Take by mouth.    [provider]  Coenzyme Q10 (CO Q-10 PO) Take by mouth.    [provider]  Cyanocobalamin (B-12 PO) Take 1 tablet by mouth daily.    [provider]  erythromycin ophthalmic ointment Place 1 application into the right eye at bedtime. 05/20/20   Roma Schanz R, DO  fenofibrate 160 MG tablet TAKE 1 TABLET ONE TIME DAILY WITH FOOD (NEED AN APPOINTMENT IN [place]) Patient not taking: Reported on 12/03/2020 05/24/19   Ann Held, DO  fish oil-omega-3 fatty acids 1000 MG capsule Take 1 g by mouth daily.     [provider]  gabapentin (NEURONTIN) 100 MG capsule Take 1 capsule (100 mg total) by mouth 3  (three) times daily. 05/20/20   Ann Held, DO  glucosamine-chondroitin 500-400 MG tablet Take 1 tablet by mouth 3 (three) times daily.    [provider]  HYDROcodone-acetaminophen (NORCO) 5-325 MG tablet Take 1 tablet by mouth every 6 (six) hours as needed. 01/21/20   Ann Held, DO  lansoprazole (PREVACID) 30 MG capsule Take 1 capsule (30 mg total) by mouth daily as needed. 08/06/14   Ann Held, DO  levothyroxine (SYNTHROID) 150 MCG tablet Take 1 tablet (150 mcg total) by mouth daily. 05/20/20   Ann Held, DO  losartan-hydrochlorothiazide (HYZAAR) 100-12.5 MG tablet TAKE 1 TABLET EVERY DAY 12/22/20   Carollee Herter, Alferd Apa, DO  methocarbamol (ROBAXIN) 500 MG tablet Take 1 tablet (500 mg total) by mouth 2 (two) times daily. 10/16/18   Jacqlyn Larsen, PA-C  metoprolol tartrate (LOPRESSOR) 25 MG tablet Take 1 tablet (25 mg total) by mouth 2 (two) times daily. 12/30/20 01/29/21  Currie Dennin, Adora Fridge, PA-C  Multiple Vitamins-Minerals (MULTIVITAMIN PO) Take 1 tablet by mouth daily.    [provider]  OVER THE COUNTER MEDICATION Primrose oil    [provider]  Potassium 99 MG TABS Take 99 mg by mouth daily.    [provider]  simvastatin (ZOCOR) 20 MG tablet TAKE 1 TABLET AT BEDTIME 08/19/20   Mosie Lukes, MD  venlafaxine XR (EFFEXOR XR) 75 MG 24 hr capsule 1 po qd 05/20/20   Ann Held, DO    Allergies    Patient has no known allergies.  Review of Systems   Review of Systems  Respiratory:  Positive for shortness of breath.   Cardiovascular:  Positive for chest pain and palpitations. Negative for leg swelling.  Neurological:  Positive for dizziness and light-headedness.   Physical Exam Updated Vital Signs BP 118/82   Pulse 92   Temp 98.6 F (37 C) (Oral)   Resp 19   Ht _0  (1.676 m)   Wt 87.5 kg   SpO2 100%   BMI 31.15 kg/m   Physical Exam Vitals and nursing note reviewed.  Constitutional:       General: She is not in acute distress.    Appearance: Normal appearance. She is not ill-appearing, toxic-appearing or diaphoretic.  HENT:     Head: Normocephalic and atraumatic.     Nose: No nasal deformity.     Mouth/Throat:     Lips: Pink. No lesions.     Mouth: No injury, lacerations, oral lesions or angioedema.     Pharynx: Uvula midline. No pharyngeal swelling or uvula swelling.  Eyes:     General: Gaze aligned appropriately. No scleral icterus.  Right eye: No discharge.        Left eye: No discharge.     Conjunctiva/sclera: Conjunctivae normal.     Right eye: Right conjunctiva is not injected. No exudate or hemorrhage.    Left eye: Left conjunctiva is not injected. No exudate or hemorrhage. Cardiovascular:     Rate and Rhythm: Tachycardia present. Rhythm irregular.     Pulses: Normal pulses.          Radial pulses are 2+ on the right side and 2+ on the left side.       Dorsalis pedis pulses are 2+ on the right side and 2+ on the left side.     Heart sounds: Normal heart sounds, S1 normal and S2 normal. Heart sounds not distant. No murmur heard.   No friction rub. No gallop. No S3 or S4 sounds.  Pulmonary:     Effort: Pulmonary effort is normal. No accessory muscle usage or respiratory distress.     Breath sounds: Normal breath sounds. No stridor. No wheezing, rhonchi or rales.  Chest:     Chest wall: No tenderness.  Abdominal:     General: Abdomen is flat. Bowel sounds are normal. There is no distension.     Palpations: Abdomen is soft. There is no mass or pulsatile mass.     Tenderness: There is no abdominal tenderness. There is no guarding or rebound.  Musculoskeletal:     Right lower leg: No edema.     Left lower leg: No edema.     Comments: She has a boot on her right foot with no obvious leg swelling.  Good pulses.  Skin:    General: Skin is warm and dry.     Coloration: Skin is not jaundiced or pale.     Findings: No bruising, erythema, lesion or rash.   Neurological:     General: No focal deficit present.     Mental Status: She is alert and oriented to person, place, and time.     GCS: GCS eye subscore is 4. GCS verbal subscore is 5. GCS motor subscore is 6.  Psychiatric:        Mood and Affect: Mood normal.        Behavior: Behavior normal. Behavior is cooperative.    ED Results / Procedures / Treatments   Labs (all labs ordered are listed, but only abnormal results are displayed) Labs Reviewed  BASIC METABOLIC PANEL - Abnormal; Notable for the following components:      Result Value   Sodium 128 (*)    Chloride 93 (*)    Glucose, Bld 117 (*)    All other components within normal limits  MAGNESIUM - Abnormal; Notable for the following components:   Magnesium 1.6 (*)    All other components within normal limits  D-DIMER, QUANTITATIVE - Abnormal; Notable for the following components:   D-Dimer, Quant 0.90 (*)    All other components within normal limits  CBC  TSH  TROPONIN I (HIGH SENSITIVITY)  TROPONIN I (HIGH SENSITIVITY)    EKG EKG Interpretation  Date/Time:  Tuesday December 30 2020 18:22:11 EST Ventricular Rate:  93 PR Interval:  156 QRS Duration: 91 QT Interval:  365 QTC Calculation: 454 R Axis:   62 Text Interpretation: Sinus rhythm When compared to ECG earlier, now sinus rhythm. No STEMI Confirmed by Antony Blackbird [phone]) on 12/30/2020 7:11:24 PM  Radiology CT Angio Chest PE W and/or Wo Contrast  Result Date: 12/30/2020 CLINICAL DATA:  Concern  for pulmonary embolism.  Positive D-dimer EXAM: CT ANGIOGRAPHY CHEST WITH CONTRAST TECHNIQUE: Multidetector CT imaging of the chest was performed using the standard protocol during bolus administration of intravenous contrast. Multiplanar CT image reconstructions and MIPs were obtained to evaluate the vascular anatomy. CONTRAST:  134m OMNIPAQUE IOHEXOL 350 MG/ML SOLN COMPARISON:  None. FINDINGS: Cardiovascular: No filling defects within the pulmonary arteries to  suggest acute pulmonary embolism. Coronary artery calcification and aortic atherosclerotic calcification. Mediastinum/Nodes: No axillary or supraclavicular adenopathy. No mediastinal or hilar adenopathy. No pericardial fluid. Esophagus normal. Lungs/Pleura: No pulmonary infarction. No pneumonia. Airways normal. Upper Abdomen: Limited view of the liver, kidneys, pancreas are unremarkable. Normal adrenal glands. Musculoskeletal: No aggressive osseous lesion. Review of the MIP images confirms the above findings. IMPRESSION: 1. No acute pulmonary embolism. 2. No acute pulmonary parenchymal findings. 3. Coronary artery calcification and Aortic Atherosclerosis (ICD10-I70.0). Electronically Signed   By: SSuzy BouchardM.D.   On: 12/30/2020 17:50   UKoreaVenous Img Lower Unilateral Right  Result Date: 12/30/2020 CLINICAL DATA:  Calf swelling, discomfort, recent lower extremity fracture EXAM: RIGHT LOWER EXTREMITY VENOUS DOPPLER ULTRASOUND TECHNIQUE: Gray-scale sonography with compression, as well as color and duplex ultrasound, were performed to evaluate the deep venous system(s) from the level of the common femoral vein through the popliteal and proximal calf veins. COMPARISON:  None. FINDINGS: VENOUS Normal compressibility of the common femoral, superficial femoral, and popliteal veins, as well as the visualized calf veins. Visualized portions of profunda femoral vein and great saphenous vein unremarkable. No filling defects to suggest DVT on grayscale or color Doppler imaging. Doppler waveforms show normal direction of venous flow, normal respiratory plasticity and response to augmentation. Limited views of the contralateral common femoral vein are unremarkable. OTHER None. Limitations: none IMPRESSION: 1. No evidence of deep venous thrombosis within the right lower extremity. Electronically Signed   By: MRanda NgoM.D.   On: 12/30/2020 17:26    Procedures .Critical Care Performed by: LAdolphus Birchwood  PA-C Authorized by: LAdolphus Birchwood PA-C   Critical care provider statement:    Critical care time (minutes):  45   Critical care was necessary to treat or prevent imminent or life-threatening deterioration of the following conditions:  Circulatory failure (A fib RVR)   Critical care was time spent personally by me on the following activities:  Blood draw for specimens, development of treatment plan with patient or surrogate, discussions with consultants, discussions with primary provider, evaluation of patient's response to treatment, examination of patient, obtaining history from patient or surrogate, review of old charts, re-evaluation of patient's condition, ordering and review of radiographic studies, pulse oximetry, ordering and review of laboratory studies and ordering and performing treatments and interventions   Medications Ordered in ED Medications  sodium chloride 0.9 % bolus 1,000 mL (0 mLs Intravenous Stopped 12/30/20 1918)  magnesium sulfate IVPB 2 g 50 mL (0 g Intravenous Stopped 12/30/20 1918)  iohexol (OMNIPAQUE) 350 MG/ML injection 100 mL (100 mLs Intravenous Contrast Given 12/30/20 1723)    ED Course  I have reviewed the triage vital signs and the nursing notes.  Pertinent labs & imaging results that were available during my care of the patient were reviewed by me and considered in my medical decision making (see chart for details).  Clinical Course as of 12/30/20 2140  Tue Dec 30, 2020  1656 Magnesium(!): 1.6 [GL]  1656 Sodium(!): 128 [GL]  1714 D-Dimer, Quant(!): 0.90 [GL]  1733 Normal DVT study [GL]  1805 Normal PE  study [GL]  1828 CHA2DS2 VASc score 3 [GL]  2135 US Venous Img Lower Unilateral Right [GL]    Clinical Course User Index [GL] Riad Wagley, Adora Fridge, PA-C   MDM Rules/Calculators/A&P                           Patient presents with new onset A. fib RVR that was found while she was in her doctor's office.  She has had some intermittent chest pains,  shortness of breath, and near syncope episodes today.  She is also had constant palpitations throughout the day.  She is currently not have any chest pain or shortness of breath symptoms. Her EKG on arrival with A. fib RVR to 153.  Her blood pressure is 131/111.  Not currently having any chest pain or shortness of breath.  Having palpitations currently.  Differential includes infection, DVT or PE given recent foot fracture, hyperthyroidism secondary to MAC indications, electrolyte abnormality, or primary atrial fibrillation 2/2 to chronic HTN.  Plan to start with a liter of fluids.  Labs including D-dimer, troponin,  and TSH are ordered.  Patient was seen and evaluated by Dr. Sherry Ruffing.  At that time she was complaining of some vein dilation in her right leg.  We went ahead and put in a DVT study. With elevated D-dimer 2.9, obtained CTA PE study study.  I reviewed all labs and imaging - CBC without any acute abnormalities.  BMP with sodium to 128 otherwise normal.  Magnesium notable for being low at 1.6.  Troponin negative.  D-dimer elevated. - DVT study of the right lower extremity was negative for blood clot. - CTA PE study was negative for pulmonary embolism. - tsh pending ([place])  I replaced patient's mag in the ED.  At approximately 1911, patient reverted to normal sinus rhythm without cardioversion or medication.  Observed her for about an hour and a half after this and had no recurrence of atrial fibrillation and remained asymptomatic.  Calculated [place] vasc score which was a 3 making her have about a 5% risk of stroke in the next year.  She will be discharged home on Eliquis until she has an appointment with the Atrial Fibrillation clinic which she will be referred to upon discharge.  I feel patient has been reasonably screened and examined and is stable for discharge.     Final Clinical Impression(s) / ED Diagnoses Final diagnoses:  Atrial fibrillation with RVR (HCC)  Heart  palpitations    Rx / DC Orders ED Discharge Orders          Ordered    metoprolol tartrate (LOPRESSOR) 25 MG tablet  2 times daily        12/30/20 1930    Amb referral to AFIB Clinic       Comments: New onset Atrial Fibrillation with RVR. Reverted to NSR without intervention. Started Eliquis for CHADSVASC score of 3.   12/30/20 1933    apixaban (ELIQUIS) 5 MG TABS tablet  2 times daily        12/30/20 1935             Sheila Oats 12/30/20 2141    Tegeler, Gwenyth Allegra, MD 12/30/20 [id number]

## 2020-12-30 NOTE — Assessment & Plan Note (Signed)
Pt going to er

## 2020-12-31 LAB — TSH: TSH: 0.055 u[IU]/mL — ABNORMAL LOW (ref 0.350–4.500)

## 2021-01-07 ENCOUNTER — Encounter (HOSPITAL_COMMUNITY): Payer: Self-pay | Admitting: Physician Assistant

## 2021-01-07 ENCOUNTER — Ambulatory Visit (HOSPITAL_COMMUNITY)
Admission: RE | Admit: 2021-01-07 | Discharge: 2021-01-07 | Disposition: A | Discharge status: Home / Self Care | Payer: Medicare HMO | Source: Ambulatory Visit | Attending: Physician Assistant | Admitting: Physician Assistant

## 2021-01-07 VITALS — BP 138/84 | HR 83 | Ht 66.0 in | Wt 187.2 lb

## 2021-01-07 DIAGNOSIS — I341 Nonrheumatic mitral (valve) prolapse: Secondary | ICD-10-CM | POA: Insufficient documentation

## 2021-01-07 DIAGNOSIS — Z7901 Long term (current) use of anticoagulants: Secondary | ICD-10-CM | POA: Diagnosis not present

## 2021-01-07 DIAGNOSIS — Z79899 Other long term (current) drug therapy: Secondary | ICD-10-CM | POA: Insufficient documentation

## 2021-01-07 DIAGNOSIS — Z6830 Body mass index (BMI) 30.0-30.9, adult: Secondary | ICD-10-CM | POA: Diagnosis not present

## 2021-01-07 DIAGNOSIS — E669 Obesity, unspecified: Secondary | ICD-10-CM | POA: Insufficient documentation

## 2021-01-07 DIAGNOSIS — I480 Paroxysmal atrial fibrillation: Secondary | ICD-10-CM | POA: Diagnosis not present

## 2021-01-07 DIAGNOSIS — D6869 Other thrombophilia: Secondary | ICD-10-CM | POA: Insufficient documentation

## 2021-01-07 DIAGNOSIS — I10 Essential (primary) hypertension: Secondary | ICD-10-CM | POA: Diagnosis not present

## 2021-01-07 DIAGNOSIS — E785 Hyperlipidemia, unspecified: Secondary | ICD-10-CM | POA: Diagnosis not present

## 2021-01-07 MED ORDER — APIXABAN 5 MG PO TABS: 5.0000 mg | ORAL_TABLET | Freq: Two times a day (BID) | ORAL | 6 refills | Status: DC

## 2021-01-07 NOTE — Progress Notes (Signed)
Primary Care Physician: Carollee Herter, Alferd Apa, DO Primary Cardiologist: Dr Johnsie Cancel (remotely) Primary Electrophysiologist: none Referring Physician: [facility]   Samantha Hanson is a 70 y.o. female with a history of hypothyroidism, HLD, HTN, MVP, atrial fibrillation who presents for consultation in the [facility].  The patient was initially diagnosed with atrial fibrillation 12/30/20 after presenting to her PCP for a broken foot. She started having chest discomfort and heart racing while at the PCP office. ECG showed new onset afib and she was sent to the ED. She had been out of metoprolol for several days. She converted to SR in the ED without medication or DCCV. Patient was started on Eliquis for a CHADS2VASC score of 3. She denies significant snoring or alcohol use. She has been under significant stress with her son who had a stroke and her broken foot.   Today, she denies symptoms of palpitations, chest pain, shortness of breath, orthopnea, PND, lower extremity edema, dizziness, presyncope, syncope, snoring, daytime somnolence, bleeding, or neurologic sequela. The patient is tolerating medications without difficulties and is otherwise without complaint today.    Atrial Fibrillation Risk Factors:  she does not have symptoms or diagnosis of sleep apnea. she does not have a history of rheumatic fever. she does not have a history of alcohol use. The patient does have a history of early familial atrial fibrillation or other arrhythmias. Sister has afib.  she has a BMI of Body mass index is 30.21 kg/m.[name] Filed Weights   01/07/21 1137  Weight: 84.9 kg    Family History  Problem Relation Age of Onset   Coronary artery disease Father    Arthritis Father    Heart disease Father        heart transplant candidate but too old--per pt   Thyroid cancer Mother    Osteoporosis Sister      Atrial Fibrillation Management history:  Previous antiarrhythmic  drugs: none Previous cardioversions: none Previous ablations: none CHADS2VASC score: 3 Anticoagulation history: Eliquis   Past Medical History:  Diagnosis Date   Hyperlipidemia    Hypertension    Migraine    MVP (mitral valve prolapse)    Thyroid disease    Past Surgical History:  Procedure Laterality Date   BUNIONECTOMY     Right foot    Current Outpatient Medications  Medication Sig Dispense Refill   ALPRAZolam (XANAX) 0.5 MG tablet 1 po 30 min prior to procedure and tid prn 10 tablet 0   apixaban (ELIQUIS) 5 MG TABS tablet Take 1 tablet (5 mg total) by mouth 2 (two) times daily. 60 tablet 0   calcium carbonate 1250 MG capsule Take 1,250 mg by mouth daily.     Cholecalciferol (VITAMIN D) 2000 UNITS CAPS Take by mouth.     Coenzyme Q10 (CO Q-10 PO) Take by mouth.     Cyanocobalamin (B-12 PO) Take 1 tablet by mouth daily.     erythromycin ophthalmic ointment Place 1 application into the right eye at bedtime. 10 each 0   fenofibrate 160 MG tablet TAKE 1 TABLET ONE TIME DAILY WITH FOOD (NEED AN APPOINTMENT IN [place]) 30 tablet 0   fish oil-omega-3 fatty acids 1000 MG capsule Take 1 g by mouth daily.      gabapentin (NEURONTIN) 100 MG capsule Take 1 capsule (100 mg total) by mouth 3 (three) times daily. 270 capsule 1   glucosamine-chondroitin 500-400 MG tablet Take 1 tablet by mouth 3 (three) times daily.  HYDROcodone-acetaminophen (NORCO) 5-325 MG tablet Take 1 tablet by mouth every 6 (six) hours as needed. 30 tablet 0   lansoprazole (PREVACID) 30 MG capsule Take 1 capsule (30 mg total) by mouth daily as needed. 90 capsule 0   levothyroxine (SYNTHROID) 150 MCG tablet Take 1 tablet (150 mcg total) by mouth daily. 90 tablet 1   losartan-hydrochlorothiazide (HYZAAR) 100-12.5 MG tablet TAKE 1 TABLET EVERY DAY 90 tablet 1   methocarbamol (ROBAXIN) 500 MG tablet Take 1 tablet (500 mg total) by mouth 2 (two) times daily. 20 tablet 0   metoprolol tartrate (LOPRESSOR) 25 MG tablet  Take 1 tablet (25 mg total) by mouth 2 (two) times daily. 60 tablet 0   Multiple Vitamins-Minerals (MULTIVITAMIN PO) Take 1 tablet by mouth daily.     OVER THE COUNTER MEDICATION Primrose oil     Potassium 99 MG TABS Take 99 mg by mouth daily.     simvastatin (ZOCOR) 20 MG tablet TAKE 1 TABLET AT BEDTIME 90 tablet 1   venlafaxine XR (EFFEXOR XR) 75 MG 24 hr capsule 1 po qd 90 capsule 3   No current facility-administered medications for this encounter.    No Known Allergies  Social History   Socioeconomic History   Marital status: Married    Spouse name: Not on file   Number of children: Not on file   Years of education: Not on file   Highest education level: Not on file  Occupational History   Not on file  Tobacco Use   Smoking status: Every Day    Packs/day: 1.50    Years: 50.00    Pack years: 75.00    Types: Cigarettes   Smokeless tobacco: Never   Tobacco comments:    1 pack daily 01/07/21  Substance and Sexual Activity   Alcohol use: No   Drug use: No   Sexual activity: Not Currently  Other Topics Concern   Not on file  Social History Narrative   Not on file   Social Determinants of Health   Financial Resource Strain: Low Risk    Difficulty of Paying Living Expenses: Not hard at all  Food Insecurity: No Food Insecurity   Worried About Charity fundraiser in the Last Year: Never true   [place] in the Last Year: Never true  Transportation Needs: No Transportation Needs   Lack of Transportation (Medical): No   Lack of Transportation (Non-Medical): No  Physical Activity: Inactive   Days of Exercise per Week: 0 days   Minutes of Exercise per Session: 0 min  Stress: Not on file  Social Connections: Socially Isolated   Frequency of Communication with Friends and Family: Once a week   Frequency of Social Gatherings with Friends and Family: Once a week   Attends Religious Services: Never   Marine scientist or Organizations: No   Attends Programme researcher, broadcasting/film/video: Never   Marital Status: Married  Human resources officer Violence: Not At Risk   Fear of Current or Ex-Partner: No   Emotionally Abused: No   Physically Abused: No   Sexually Abused: No     ROS- All systems are reviewed and negative except as per the HPI above.  Physical Exam: Vitals:   01/07/21 1137  BP: 138/84  Pulse: 83  Weight: 84.9 kg  Height: 5\' 6"  (1.676 m)    GEN- The patient is a well appearing obese female, alert and oriented x 3 today.   Head- normocephalic, atraumatic Eyes-  Sclera clear, conjunctiva pink Ears- hearing intact Oropharynx- clear Neck- supple  Lungs- Clear to ausculation bilaterally, normal work of breathing Heart- Regular rate and rhythm, no murmurs, rubs or gallops  GI- soft, NT, ND, + BS Extremities- no clubbing, cyanosis, or edema MS- no significant deformity or atrophy Skin- no rash or lesion Psych- euthymic mood, full affect Neuro- strength and sensation are intact  Wt Readings from Last 3 Encounters:  01/07/21 84.9 kg  12/30/20 87.5 kg  12/30/20 87.2 kg    EKG today demonstrates  SR Vent. rate 83 BPM PR interval 144 ms QRS duration 82 ms QT/QTcB 376/441 ms   Epic records are reviewed at length today  CHA2DS2-VASc Score = 3  The patient's score is based upon: CHF History: 0 HTN History: 1 Diabetes History: 0 Stroke History: 0 Vascular Disease History: 0 Age Score: 1 Gender Score: 1      ASSESSMENT AND PLAN: 1. Paroxysmal Atrial Fibrillation (ICD10:  I48.0) The patient's CHA2DS2-VASc score is 3, indicating a 3.2% annual risk of stroke.   General education about afib provided and questions answered. We also discussed her stroke risk and the risks and benefits of anticoagulation. Will check echocardiogram  Continue Eliquis 5 mg BID, if foot surgery is needed, OK to hold anticoagulation.  Continue Lopressor 25 mg BID  2. Secondary Hypercoagulable State (ICD10:  D68.69) The patient is at significant  risk for stroke/thromboembolism based upon her CHA2DS2-VASc Score of 3.  Continue Apixaban (Eliquis).   3. Obesity Body mass index is 30.21 kg/m. Lifestyle modification was discussed at length including regular exercise and weight reduction.  4. HTN Stable, no changes today.   Follow up in the AF clinic 6-8 weeks.    [facility] [address] [place], [place] [address] [phone] 01/07/2021 11:46 AM

## 2021-01-21 ENCOUNTER — Other Ambulatory Visit: Payer: Self-pay | Admitting: Family Medicine

## 2021-02-06 ENCOUNTER — Other Ambulatory Visit: Payer: Self-pay | Admitting: Family Medicine

## 2021-02-16 ENCOUNTER — Telehealth: Payer: Self-pay | Admitting: Family Medicine

## 2021-02-16 MED ORDER — LEVOTHYROXINE SODIUM 150 MCG PO TABS: 150.0000 ug | ORAL_TABLET | Freq: Every day | ORAL | 1 refills | Status: DC

## 2021-02-16 NOTE — Telephone Encounter (Signed)
Medication:  levothyroxine (SYNTHROID) 150 MCG tablet [[id number]]    Has the patient contacted their pharmacy? Yes.   (If no, request that the patient contact the pharmacy for the refill.) (If yes, when and what did the pharmacy advise?)     Preferred Pharmacy (with phone number or street name):  [address] [place],  [address] [phone]    Agent: Please be advised that RX refills may take up to 3 business days. We ask that you follow-up with your pharmacy.

## 2021-02-16 NOTE — Telephone Encounter (Signed)
Refill sent.

## 2021-02-20 ENCOUNTER — Ambulatory Visit (HOSPITAL_COMMUNITY): Payer: Medicare HMO

## 2021-02-25 ENCOUNTER — Ambulatory Visit (HOSPITAL_COMMUNITY): Payer: Medicare Other | Admitting: Physician Assistant

## 2021-04-06 ENCOUNTER — Other Ambulatory Visit (HOSPITAL_COMMUNITY): Payer: Self-pay | Admitting: *Deleted

## 2021-04-06 DIAGNOSIS — I4891 Unspecified atrial fibrillation: Secondary | ICD-10-CM

## 2021-04-21 ENCOUNTER — Ambulatory Visit (HOSPITAL_COMMUNITY)
Admission: RE | Admit: 2021-04-21 | Discharge: 2021-04-21 | Disposition: A | Discharge status: Home / Self Care | Payer: Medicare HMO | Source: Ambulatory Visit | Attending: Family Medicine | Admitting: Family Medicine

## 2021-04-21 ENCOUNTER — Other Ambulatory Visit: Payer: Self-pay

## 2021-04-21 DIAGNOSIS — I350 Nonrheumatic aortic (valve) stenosis: Secondary | ICD-10-CM | POA: Diagnosis not present

## 2021-04-21 DIAGNOSIS — I4891 Unspecified atrial fibrillation: Secondary | ICD-10-CM | POA: Insufficient documentation

## 2021-04-21 DIAGNOSIS — I10 Essential (primary) hypertension: Secondary | ICD-10-CM | POA: Diagnosis not present

## 2021-04-21 DIAGNOSIS — E785 Hyperlipidemia, unspecified: Secondary | ICD-10-CM | POA: Diagnosis not present

## 2021-04-21 DIAGNOSIS — J449 Chronic obstructive pulmonary disease, unspecified: Secondary | ICD-10-CM | POA: Insufficient documentation

## 2021-04-21 LAB — ECHOCARDIOGRAM COMPLETE
Area-P 1/2: 2.31 cm2
S' Lateral: 2.9 cm

## 2021-04-27 ENCOUNTER — Other Ambulatory Visit: Payer: Self-pay

## 2021-04-27 ENCOUNTER — Encounter (HOSPITAL_COMMUNITY): Payer: Self-pay | Admitting: Physician Assistant

## 2021-04-27 ENCOUNTER — Ambulatory Visit (HOSPITAL_COMMUNITY)
Admission: RE | Admit: 2021-04-27 | Discharge: 2021-04-27 | Disposition: A | Discharge status: Home / Self Care | Payer: Medicare HMO | Source: Ambulatory Visit | Attending: Physician Assistant | Admitting: Physician Assistant

## 2021-04-27 VITALS — BP 136/88 | HR 84 | Ht 66.0 in | Wt 184.0 lb

## 2021-04-27 DIAGNOSIS — I2510 Atherosclerotic heart disease of native coronary artery without angina pectoris: Secondary | ICD-10-CM | POA: Insufficient documentation

## 2021-04-27 DIAGNOSIS — E039 Hypothyroidism, unspecified: Secondary | ICD-10-CM | POA: Insufficient documentation

## 2021-04-27 DIAGNOSIS — D6869 Other thrombophilia: Secondary | ICD-10-CM | POA: Diagnosis not present

## 2021-04-27 DIAGNOSIS — E785 Hyperlipidemia, unspecified: Secondary | ICD-10-CM | POA: Insufficient documentation

## 2021-04-27 DIAGNOSIS — I480 Paroxysmal atrial fibrillation: Secondary | ICD-10-CM | POA: Diagnosis present

## 2021-04-27 DIAGNOSIS — I341 Nonrheumatic mitral (valve) prolapse: Secondary | ICD-10-CM | POA: Insufficient documentation

## 2021-04-27 DIAGNOSIS — I10 Essential (primary) hypertension: Secondary | ICD-10-CM | POA: Diagnosis not present

## 2021-04-27 NOTE — Progress Notes (Signed)
? ? ?Primary Care Physician: Carollee Herter, Alferd Apa, DO ?Primary Cardiologist: Dr Johnsie Cancel (remotely) ?Primary Electrophysiologist: none ?Referring Physician: [facility] ? ? ?Samantha Hanson is a 71 y.o. female with a history of hypothyroidism, HLD, HTN, MVP, atrial fibrillation who presents for follow up in the [facility].  The patient was initially diagnosed with atrial fibrillation 12/30/20 after presenting to her PCP for a broken foot. She started having chest discomfort and heart racing while at the PCP office. ECG showed new onset afib and she was sent to the ED. She had been out of metoprolol for several days. She converted to SR in the ED without medication or DCCV. Patient was started on Eliquis for a CHADS2VASC score of 3. She denies significant snoring or alcohol use.  ? ?On follow up today, patient reports that she has had two episodes of tachypalpitations last < 10 minutes. She denies any bleeding issues on anticoagulation. She admits that she occasionally forgets her evening doses of Eliquis and Lopressor.  ? ?Today, she denies symptoms of chest pain, shortness of breath, orthopnea, PND, lower extremity edema, dizziness, presyncope, syncope, snoring, daytime somnolence, bleeding, or neurologic sequela. The patient is tolerating medications without difficulties and is otherwise without complaint today.  ? ? ?Atrial Fibrillation Risk Factors: ? ?she does not have symptoms or diagnosis of sleep apnea. ?she does not have a history of rheumatic fever. ?she does not have a history of alcohol use. ?The patient does have a history of early familial atrial fibrillation or other arrhythmias. Sister has afib. ? ?she has a BMI of Body mass index is 29.7 kg/m?Marland KitchenMarland Kitchen ?Filed Weights  ? 04/27/21 1449  ?Weight: 83.5 kg  ? ? ? ?Family History  ?Problem Relation Age of Onset  ? Coronary artery disease Father   ? Arthritis Father   ? Heart disease Father   ?     heart transplant candidate but too  old--per pt  ? Thyroid cancer Mother   ? Osteoporosis Sister   ? ? ? ?Atrial Fibrillation Management history: ? ?Previous antiarrhythmic drugs: none ?Previous cardioversions: none ?Previous ablations: none ?CHADS2VASC score: 3 ?Anticoagulation history: Eliquis ? ? ?Past Medical History:  ?Diagnosis Date  ? Hyperlipidemia   ? Hypertension   ? Migraine   ? MVP (mitral valve prolapse)   ? Thyroid disease   ? ?Past Surgical History:  ?Procedure Laterality Date  ? BUNIONECTOMY    ? Right foot  ? ? ?Current Outpatient Medications  ?Medication Sig Dispense Refill  ? ALPRAZolam (XANAX) 0.5 MG tablet 1 po 30 min prior to procedure and tid prn 10 tablet 0  ? apixaban (ELIQUIS) 5 MG TABS tablet Take 1 tablet (5 mg total) by mouth 2 (two) times daily. 60 tablet 6  ? calcium carbonate 1250 MG capsule Take 1,250 mg by mouth daily.    ? Cholecalciferol (VITAMIN D) 2000 UNITS CAPS Take by mouth.    ? Coenzyme Q10 (CO Q-10 PO) Take by mouth.    ? Cyanocobalamin (B-12 PO) Take 1 tablet by mouth daily.    ? erythromycin ophthalmic ointment Place 1 application into the right eye at bedtime. 10 each 0  ? fish oil-omega-3 fatty acids 1000 MG capsule Take 1 g by mouth daily.     ? gabapentin (NEURONTIN) 100 MG capsule Take 1 capsule (100 mg total) by mouth 3 (three) times daily. 270 capsule 1  ? glucosamine-chondroitin 500-400 MG tablet Take 1 tablet by mouth 3 (three) times daily.    ?  HYDROcodone-acetaminophen (NORCO) 5-325 MG tablet Take 1 tablet by mouth every 6 (six) hours as needed. 30 tablet 0  ? lansoprazole (PREVACID) 30 MG capsule Take 1 capsule (30 mg total) by mouth daily as needed. 90 capsule 0  ? levothyroxine (SYNTHROID) 150 MCG tablet Take 1 tablet (150 mcg total) by mouth daily. 90 tablet 1  ? losartan-hydrochlorothiazide (HYZAAR) 100-12.5 MG tablet TAKE 1 TABLET EVERY DAY 90 tablet 1  ? methocarbamol (ROBAXIN) 500 MG tablet Take 1 tablet (500 mg total) by mouth 2 (two) times daily. 20 tablet 0  ? metoprolol tartrate  (LOPRESSOR) 25 MG tablet Take 1 tablet (25 mg total) by mouth 2 (two) times daily. 60 tablet 0  ? Multiple Vitamins-Minerals (MULTIVITAMIN PO) Take 1 tablet by mouth daily.    ? OVER THE COUNTER MEDICATION Primrose oil    ? Potassium 99 MG TABS Take 99 mg by mouth daily.    ? simvastatin (ZOCOR) 20 MG tablet TAKE 1 TABLET AT BEDTIME 90 tablet 0  ? venlafaxine XR (EFFEXOR XR) 75 MG 24 hr capsule 1 po qd 90 capsule 3  ? ?No current facility-administered medications for this encounter.  ? ? ?No Known Allergies ? ?Social History  ? ?Socioeconomic History  ? Marital status: Married  ?  Spouse name: Not on file  ? Number of children: Not on file  ? Years of education: Not on file  ? Highest education level: Not on file  ?Occupational History  ? Not on file  ?Tobacco Use  ? Smoking status: Every Day  ?  Packs/day: 1.50  ?  Years: 50.00  ?  Pack years: 75.00  ?  Types: Cigarettes  ? Smokeless tobacco: Never  ? Tobacco comments:  ?  1 pack daily 01/07/21  ?Substance and Sexual Activity  ? Alcohol use: No  ? Drug use: No  ? Sexual activity: Not Currently  ?Other Topics Concern  ? Not on file  ?Social History Narrative  ? Not on file  ? ?Social Determinants of Health  ? ?Financial Resource Strain: Low Risk   ? Difficulty of Paying Living Expenses: Not hard at all  ?Food Insecurity: No Food Insecurity  ? Worried About Charity fundraiser in the Last Year: Never true  ? Ran Out of Food in the Last Year: Never true  ?Transportation Needs: No Transportation Needs  ? Lack of Transportation (Medical): No  ? Lack of Transportation (Non-Medical): No  ?Physical Activity: Inactive  ? Days of Exercise per Week: 0 days  ? Minutes of Exercise per Session: 0 min  ?Stress: Not on file  ?Social Connections: Socially Isolated  ? Frequency of Communication with Friends and Family: Once a week  ? Frequency of Social Gatherings with Friends and Family: Once a week  ? Attends Religious Services: Never  ? Active Member of Clubs or Organizations: No   ? Attends Archivist Meetings: Never  ? Marital Status: Married  ?Intimate Partner Violence: Not At Risk  ? Fear of Current or Ex-Partner: No  ? Emotionally Abused: No  ? Physically Abused: No  ? Sexually Abused: No  ? ? ? ?ROS- All systems are reviewed and negative except as per the HPI above. ? ?Physical Exam: ?Vitals:  ? 04/27/21 1449  ?BP: 136/88  ?Pulse: 84  ?Weight: 83.5 kg  ?Height: '5\' 6"'$  (1.676 m)  ? ? ?GEN- The patient is a well appearing female, alert and oriented x 3 today.   ?HEENT-head normocephalic, atraumatic, sclera clear,  conjunctiva pink, hearing intact, trachea midline. ?Lungs- Clear to ausculation bilaterally, normal work of breathing ?Heart- Regular rate and rhythm, no murmurs, rubs or gallops  ?GI- soft, NT, ND, + BS ?Extremities- no clubbing, cyanosis, or edema ?MS- no significant deformity or atrophy ?Skin- no rash or lesion ?Psych- euthymic mood, full affect ?Neuro- strength and sensation are intact ? ? ?Wt Readings from Last 3 Encounters:  ?04/27/21 83.5 kg  ?01/07/21 84.9 kg  ?12/30/20 87.5 kg  ? ? ?EKG today demonstrates  ?SR ?Vent. rate 84 BPM ?PR interval 144 ms ?QRS duration 80 ms ?QT/QTcB 368/434 ms ? ?Echo 04/21/21 ? 1. Left ventricular ejection fraction, by estimation, is 55 to 60%. The  ?left ventricle has normal function. The left ventricle has no regional  ?wall motion abnormalities. Left ventricular diastolic parameters were  ?normal. The average left ventricular global longitudinal strain is -17.7 %. The global longitudinal strain is normal.  ? 2. Right ventricular systolic function is normal. The right ventricular  ?size is normal. There is normal pulmonary artery systolic pressure.  ? 3. The mitral valve is normal in structure. Trivial mitral valve  ?regurgitation. No evidence of mitral stenosis.  ? 4. The aortic valve is tricuspid. There is moderate calcification of the  ?aortic valve. There is mild thickening of the aortic valve. Aortic valve  ?regurgitation is  not visualized. Aortic valve sclerosis/calcification is  ?present, without any evidence of aortic stenosis.  ? 5. The inferior vena cava is normal in size with greater than 50%  ?respiratory variability,

## 2021-06-22 ENCOUNTER — Other Ambulatory Visit: Payer: Self-pay | Admitting: Family Medicine

## 2021-06-22 DIAGNOSIS — L4050 Arthropathic psoriasis, unspecified: Secondary | ICD-10-CM

## 2021-06-22 DIAGNOSIS — F418 Other specified anxiety disorders: Secondary | ICD-10-CM

## 2021-09-11 ENCOUNTER — Other Ambulatory Visit: Payer: Self-pay | Admitting: Family Medicine

## 2021-09-11 DIAGNOSIS — I10 Essential (primary) hypertension: Secondary | ICD-10-CM

## 2021-09-15 ENCOUNTER — Telehealth: Payer: Self-pay | Admitting: Family Medicine

## 2021-09-15 DIAGNOSIS — I10 Essential (primary) hypertension: Secondary | ICD-10-CM

## 2021-09-15 NOTE — Telephone Encounter (Signed)
[facility] states simvastatin, metoprolol tartrate, losartan-hydrochlorothiazide were denied and pt would like to know why. Advised pharmacy metoprolol tartrate was not prescribed by pcp. Also advised she has not been seen in a couple months and nurse would follow up with pt if they are overdue for an appt.

## 2021-09-16 MED ORDER — LOSARTAN POTASSIUM-HCTZ 100-12.5 MG PO TABS: ORAL_TABLET | ORAL | 1 refills | Status: DC

## 2021-09-16 MED ORDER — SIMVASTATIN 20 MG PO TABS: 20.0000 mg | ORAL_TABLET | Freq: Every day | ORAL | 0 refills | Status: DC

## 2021-09-16 NOTE — Telephone Encounter (Signed)
Appropriate refills sent. Pt has appt on 8/11.

## 2021-09-18 ENCOUNTER — Ambulatory Visit: Payer: Medicare HMO | Admitting: Family Medicine

## 2021-09-21 ENCOUNTER — Ambulatory Visit (INDEPENDENT_AMBULATORY_CARE_PROVIDER_SITE_OTHER): Payer: Medicare HMO | Admitting: Family Medicine

## 2021-09-21 ENCOUNTER — Encounter: Payer: Self-pay | Admitting: Family Medicine

## 2021-09-21 VITALS — BP 120/80 | HR 78 | Temp 98.0°F | Resp 18 | Ht 66.0 in | Wt 180.0 lb

## 2021-09-21 DIAGNOSIS — E785 Hyperlipidemia, unspecified: Secondary | ICD-10-CM

## 2021-09-21 DIAGNOSIS — L4050 Arthropathic psoriasis, unspecified: Secondary | ICD-10-CM

## 2021-09-21 DIAGNOSIS — Z1159 Encounter for screening for other viral diseases: Secondary | ICD-10-CM | POA: Diagnosis not present

## 2021-09-21 DIAGNOSIS — S81819A Laceration without foreign body, unspecified lower leg, initial encounter: Secondary | ICD-10-CM | POA: Diagnosis not present

## 2021-09-21 DIAGNOSIS — I10 Essential (primary) hypertension: Secondary | ICD-10-CM | POA: Diagnosis not present

## 2021-09-21 DIAGNOSIS — Z23 Encounter for immunization: Secondary | ICD-10-CM

## 2021-09-21 DIAGNOSIS — E039 Hypothyroidism, unspecified: Secondary | ICD-10-CM

## 2021-09-21 DIAGNOSIS — F418 Other specified anxiety disorders: Secondary | ICD-10-CM

## 2021-09-21 DIAGNOSIS — E559 Vitamin D deficiency, unspecified: Secondary | ICD-10-CM

## 2021-09-21 MED ORDER — METOPROLOL TARTRATE 25 MG PO TABS: 25.0000 mg | ORAL_TABLET | Freq: Two times a day (BID) | ORAL | 1 refills | Status: DC

## 2021-09-21 MED ORDER — LEVOTHYROXINE SODIUM 150 MCG PO TABS: 150.0000 ug | ORAL_TABLET | Freq: Every day | ORAL | 1 refills | Status: DC

## 2021-09-21 MED ORDER — GABAPENTIN 100 MG PO CAPS: 100.0000 mg | ORAL_CAPSULE | Freq: Three times a day (TID) | ORAL | 0 refills | Status: DC

## 2021-09-21 MED ORDER — VENLAFAXINE HCL ER 75 MG PO CP24: ORAL_CAPSULE | ORAL | 3 refills | Status: DC

## 2021-09-21 NOTE — Patient Instructions (Signed)

## 2021-09-21 NOTE — Progress Notes (Signed)
Subjective:   By signing my name below, I, Samantha Hanson, attest that this documentation has been prepared under the direction and in the presence of Samantha Held DO 09/21/2021   Patient ID: Samantha Hanson, female    DOB: 28-Oct-1950, 71 y.o.   MRN: [id number]  Chief Complaint  Patient presents with   Hypothyroidism   Hypertension   Follow-up    HPI Patient is in today for an office visit  She is requesting a refill of 100 Mg of Gabapentin, 25 Mg of Metoprolol Tartrate, 150 MCG of Synthroid, and 75 Mg of Effexor-XR.   She reports that she does not take Vitamin D supplements.   She also reports that she no longer takes erythromycin ophthalmic ointment.   As of today's visit, her blood pressure is normal BP Readings from Last 3 Encounters:  09/21/21 120/80  04/27/21 136/88  01/07/21 138/84   Pulse Readings from Last 3 Encounters:  09/21/21 78  04/27/21 84  01/07/21 83   She is requesting for an Pneumonia vaccine. She has not received a Shingrix vaccine but is interested in receiving one at her local pharmacy. She reports that tetanus vaccine administered by her pharmacy is too expensive for her. She reports that she has received four Covid-19 vaccines.   She was shaving her legs and reports that she has minor cuts. She is currently taking 5 Mg of Eliquis.   Past Medical History:  Diagnosis Date   Hyperlipidemia    Hypertension    Migraine    MVP (mitral valve prolapse)    Thyroid disease     Past Surgical History:  Procedure Laterality Date   BUNIONECTOMY     Right foot    Family History  Problem Relation Age of Onset   Coronary artery disease Father    Arthritis Father    Heart disease Father        heart transplant candidate but too old--per pt   Thyroid cancer Mother    Osteoporosis Sister     Social History   Socioeconomic History   Marital status: Married    Spouse name: Not on file   Number of children: Not on file   Years of  education: Not on file   Highest education level: Not on file  Occupational History   Not on file  Tobacco Use   Smoking status: Every Day    Packs/day: 1.50    Years: 50.00    Total pack years: 75.00    Types: Cigarettes   Smokeless tobacco: Never   Tobacco comments:    1 pack daily 01/07/21  Substance and Sexual Activity   Alcohol use: No   Drug use: No   Sexual activity: Not Currently  Other Topics Concern   Not on file  Social History Narrative   Not on file   Social Determinants of Health   Financial Resource Strain: Low Risk  (12/03/2020)   Overall Financial Resource Strain (CARDIA)    Difficulty of Paying Living Expenses: Not hard at all  Food Insecurity: No Food Insecurity (12/03/2020)   Hunger Vital Sign    Worried About Running Out of Food in the Last Year: Never true    Ran Out of Food in the Last Year: Never true  Transportation Needs: No Transportation Needs (12/03/2020)   PRAPARE - Hydrologist (Medical): No    Lack of Transportation (Non-Medical): No  Physical Activity: Inactive (12/03/2020)   Exercise Vital Sign  Days of Exercise per Week: 0 days    Minutes of Exercise per Session: 0 min  Stress: Not on file  Social Connections: Socially Isolated (12/03/2020)   Social Connection and Isolation Panel [NHANES]    Frequency of Communication with Friends and Family: Once a week    Frequency of Social Gatherings with Friends and Family: Once a week    Attends Religious Services: Never    Marine scientist or Organizations: No    Attends Archivist Meetings: Never    Marital Status: Married  Human resources officer Violence: Not At Risk (12/03/2020)   Humiliation, Afraid, Rape, and Kick questionnaire    Fear of Current or Ex-Partner: No    Emotionally Abused: No    Physically Abused: No    Sexually Abused: No    Outpatient Medications Prior to Visit  Medication Sig Dispense Refill   calcium carbonate 1250 MG  capsule Take 1,250 mg by mouth daily.     Cholecalciferol (VITAMIN D) 2000 UNITS CAPS Take by mouth.     Coenzyme Q10 (CO Q-10 PO) Take by mouth.     Cyanocobalamin (B-12 PO) Take 1 tablet by mouth daily.     fish oil-omega-3 fatty acids 1000 MG capsule Take 1 g by mouth daily.      glucosamine-chondroitin 500-400 MG tablet Take 1 tablet by mouth 3 (three) times daily.     HYDROcodone-acetaminophen (NORCO) 5-325 MG tablet Take 1 tablet by mouth every 6 (six) hours as needed. 30 tablet 0   lansoprazole (PREVACID) 30 MG capsule Take 1 capsule (30 mg total) by mouth daily as needed. 90 capsule 0   losartan-hydrochlorothiazide (HYZAAR) 100-12.5 MG tablet TAKE 1 TABLET EVERY DAY 90 tablet 1   methocarbamol (ROBAXIN) 500 MG tablet Take 1 tablet (500 mg total) by mouth 2 (two) times daily. 20 tablet 0   Multiple Vitamins-Minerals (MULTIVITAMIN PO) Take 1 tablet by mouth daily.     OVER THE COUNTER MEDICATION Primrose oil     Potassium 99 MG TABS Take 99 mg by mouth daily.     simvastatin (ZOCOR) 20 MG tablet Take 1 tablet (20 mg total) by mouth at bedtime. 90 tablet 0   ALPRAZolam (XANAX) 0.5 MG tablet 1 po 30 min prior to procedure and tid prn 10 tablet 0   gabapentin (NEURONTIN) 100 MG capsule Take 1 capsule (100 mg total) by mouth 3 (three) times daily. Pt needs follow up for further refills 270 capsule 0   levothyroxine (SYNTHROID) 150 MCG tablet Take 1 tablet (150 mcg total) by mouth daily. 90 tablet 1   venlafaxine XR (EFFEXOR-XR) 75 MG 24 hr capsule TAKE 1 CAPSULE EVERY DAY. Pt needs follow up for further refills 90 capsule 0   apixaban (ELIQUIS) 5 MG TABS tablet Take 1 tablet (5 mg total) by mouth 2 (two) times daily. 60 tablet 6   erythromycin ophthalmic ointment Place 1 application into the right eye at bedtime. 10 each 0   metoprolol tartrate (LOPRESSOR) 25 MG tablet Take 1 tablet (25 mg total) by mouth 2 (two) times daily. 60 tablet 0   No facility-administered medications prior to visit.     No Known Allergies  Review of Systems  Constitutional:  Negative for fever and malaise/fatigue.  HENT:  Negative for congestion.   Eyes:  Negative for blurred vision.  Respiratory:  Negative for shortness of breath.   Cardiovascular:  Negative for chest pain, palpitations and leg swelling.  Gastrointestinal:  Negative for abdominal  pain, blood in stool and nausea.  Genitourinary:  Negative for dysuria and frequency.  Musculoskeletal:  Negative for falls.  Skin:  Negative for rash.  Neurological:  Negative for dizziness, loss of consciousness and headaches.  Endo/Heme/Allergies:  Negative for environmental allergies.  Psychiatric/Behavioral:  Negative for depression. The patient is not nervous/anxious.        Objective:    Physical Exam Vitals and nursing note reviewed.  Constitutional:      General: She is not in acute distress.    Appearance: Normal appearance. She is not ill-appearing.  HENT:     Head: Normocephalic and atraumatic.     Right Ear: External ear normal.     Left Ear: External ear normal.  Eyes:     Extraocular Movements: Extraocular movements intact.     Pupils: Pupils are equal, round, and reactive to light.  Cardiovascular:     Rate and Rhythm: Normal rate and regular rhythm.     Heart sounds: Normal heart sounds. No murmur heard.    No gallop.  Pulmonary:     Effort: Pulmonary effort is normal. No respiratory distress.     Breath sounds: Normal breath sounds. No wheezing or rales.  Skin:    General: Skin is warm and dry.  Neurological:     Mental Status: She is alert and oriented to person, place, and time.  Psychiatric:        Judgment: Judgment normal.     BP 120/80 (BP Location: Left Arm, Patient Position: Sitting, Cuff Size: Large)   Pulse 78   Temp 98 F (36.7 C) (Oral)   Resp 18   Ht '5\' 6"'$  (1.676 m)   Wt 180 lb (81.6 kg)   SpO2 97%   BMI 29.05 kg/m  Wt Readings from Last 3 Encounters:  09/21/21 180 lb (81.6 kg)  04/27/21 184  lb (83.5 kg)  01/07/21 187 lb 3.2 oz (84.9 kg)    Diabetic Foot Exam - Simple   No data filed    Lab Results  Component Value Date   WBC 6.9 12/30/2020   HGB 13.8 12/30/2020   HCT 41.7 12/30/2020   PLT 262 12/30/2020   GLUCOSE 117 (H) 12/30/2020   CHOL 330 (H) 05/20/2020   TRIG 377.0 (H) 05/20/2020   HDL 37.30 (L) 05/20/2020   LDLDIRECT 86.0 05/20/2020   LDLCALC 262 (H) 06/05/2013   ALT 18 05/20/2020   AST 12 05/20/2020   NA 128 (L) 12/30/2020   K 3.6 12/30/2020   CL 93 (L) 12/30/2020   CREATININE 0.89 12/30/2020   BUN 18 12/30/2020   CO2 22 12/30/2020   TSH 0.055 (L) 12/30/2020   HGBA1C 6.6 (H) 05/20/2020   MICROALBUR 1.3 05/20/2020    Lab Results  Component Value Date   TSH 0.055 (L) 12/30/2020   Lab Results  Component Value Date   WBC 6.9 12/30/2020   HGB 13.8 12/30/2020   HCT 41.7 12/30/2020   MCV 86.5 12/30/2020   PLT 262 12/30/2020   Lab Results  Component Value Date   NA 128 (L) 12/30/2020   K 3.6 12/30/2020   CO2 22 12/30/2020   GLUCOSE 117 (H) 12/30/2020   BUN 18 12/30/2020   CREATININE 0.89 12/30/2020   BILITOT 0.4 05/20/2020   ALKPHOS 41 05/20/2020   AST 12 05/20/2020   ALT 18 05/20/2020   PROT 6.9 05/20/2020   ALBUMIN 4.1 05/20/2020   CALCIUM 10.1 12/30/2020   ANIONGAP 13 12/30/2020   GFR 57.96 (L)  05/20/2020   Lab Results  Component Value Date   CHOL 330 (H) 05/20/2020   Lab Results  Component Value Date   HDL 37.30 (L) 05/20/2020   Lab Results  Component Value Date   LDLCALC 262 (H) 06/05/2013   Lab Results  Component Value Date   TRIG 377.0 (H) 05/20/2020   Lab Results  Component Value Date   CHOLHDL 9 05/20/2020   Lab Results  Component Value Date   HGBA1C 6.6 (H) 05/20/2020       Assessment & Plan:   Problem List Items Addressed This Visit       Unprioritized   Psoriatic arthritis ([place])   Relevant Medications   gabapentin (NEURONTIN) 100 MG capsule   Hyperlipidemia   Relevant Medications   metoprolol  tartrate (LOPRESSOR) 25 MG tablet   Other Relevant Orders   Comprehensive metabolic panel   Lipid panel   Essential hypertension   Relevant Medications   metoprolol tartrate (LOPRESSOR) 25 MG tablet   Hypothyroidism - Primary    Check thyroid        Relevant Medications   levothyroxine (SYNTHROID) 150 MCG tablet   metoprolol tartrate (LOPRESSOR) 25 MG tablet   Other Relevant Orders   TSH   HTN (hypertension)    Well controlled, no changes to meds. Encouraged heart healthy diet such as the DASH diet and exercise as tolerated.       Relevant Medications   metoprolol tartrate (LOPRESSOR) 25 MG tablet   Other Relevant Orders   CBC with Differential/Platelet   Lipid panel   TSH   Other Visit Diagnoses     Depression with anxiety       Relevant Medications   venlafaxine XR (EFFEXOR-XR) 75 MG 24 hr capsule   Vitamin D deficiency       Relevant Orders   VITAMIN D 25 Hydroxy (Vit-D Deficiency, Fractures)   Need for hepatitis C screening test       Relevant Orders   Hepatitis C Antibody   Need for pneumococcal 20-valent conjugate vaccination       Relevant Orders   Pneumococcal conjugate vaccine 20-valent (Prevnar 20) (Completed)   Laceration of skin of lower leg, unspecified laterality, initial encounter       Relevant Orders   Td : Tetanus/diphtheria >7yo Preservative  free (Completed)      Meds ordered this encounter  Medications   gabapentin (NEURONTIN) 100 MG capsule    Sig: Take 1 capsule (100 mg total) by mouth 3 (three) times daily. Pt needs follow up for further refills    Dispense:  270 capsule    Refill:  0   levothyroxine (SYNTHROID) 150 MCG tablet    Sig: Take 1 tablet (150 mcg total) by mouth daily.    Dispense:  90 tablet    Refill:  1   venlafaxine XR (EFFEXOR-XR) 75 MG 24 hr capsule    Sig: TAKE 1 CAPSULE EVERY DAY. Pt needs follow up for further refills    Dispense:  90 capsule    Refill:  3   metoprolol tartrate (LOPRESSOR) 25 MG tablet    Sig:  Take 1 tablet (25 mg total) by mouth 2 (two) times daily.    Dispense:  180 tablet    Refill:  1    I, Samantha Held, DO, personally preformed the services described in this documentation.  All medical record entries made by the scribe were at my direction and in my presence.  I have  reviewed the chart and discharge instructions (if applicable) and agree that the record reflects my personal performance and is accurate and complete. 09/21/2021   I,Amber Collins,acting as a scribe for Samantha Held, DO.,have documented all relevant documentation on the behalf of Samantha Held, DO,as directed by  Samantha Held, DO while in the presence of Samantha Held, DO.    Samantha Held, DO

## 2021-09-21 NOTE — Assessment & Plan Note (Signed)
Well controlled, no changes to meds. Encouraged heart healthy diet such as the DASH diet and exercise as tolerated.  °

## 2021-09-21 NOTE — Assessment & Plan Note (Signed)
Check thyroid

## 2021-09-22 LAB — COMPREHENSIVE METABOLIC PANEL
ALT: 21 U/L (ref 0–35)
AST: 16 U/L (ref 0–37)
Albumin: 4.6 g/dL (ref 3.5–5.2)
Alkaline Phosphatase: 46 U/L (ref 39–117)
BUN: 23 mg/dL (ref 6–23)
CO2: 27 mEq/L (ref 19–32)
Calcium: 10.3 mg/dL (ref 8.4–10.5)
Chloride: 97 mEq/L (ref 96–112)
Creatinine, Ser: 1.03 mg/dL (ref 0.40–1.20)
GFR: 54.09 mL/min — ABNORMAL LOW (ref >60.00)
Glucose, Bld: 104 mg/dL — ABNORMAL HIGH (ref 70–99)
Potassium: 4.4 mEq/L (ref 3.5–5.1)
Sodium: 137 mEq/L (ref 135–145)
Total Bilirubin: 0.4 mg/dL (ref 0.2–1.2)
Total Protein: 7.6 g/dL (ref 6.0–8.3)

## 2021-09-22 LAB — CBC WITH DIFFERENTIAL/PLATELET
Basophils Absolute: 0 10*3/uL (ref 0.0–0.1)
Basophils Relative: 0.6 % (ref 0.0–3.0)
Eosinophils Absolute: 0.1 10*3/uL (ref 0.0–0.7)
Eosinophils Relative: 2 % (ref 0.0–5.0)
HCT: 41.1 % (ref 36.0–46.0)
Hemoglobin: 13.5 g/dL (ref 12.0–15.0)
Lymphocytes Relative: 25.6 % (ref 12.0–46.0)
Lymphs Abs: 1.5 10*3/uL (ref 0.7–4.0)
MCHC: 33 g/dL (ref 30.0–36.0)
MCV: 89.8 fl (ref 78.0–100.0)
Monocytes Absolute: 0.4 10*3/uL (ref 0.1–1.0)
Monocytes Relative: 6 % (ref 3.0–12.0)
Neutro Abs: 3.9 10*3/uL (ref 1.4–7.7)
Neutrophils Relative %: 65.8 % (ref 43.0–77.0)
Platelets: 252 10*3/uL (ref 150.0–400.0)
RBC: 4.58 Mil/uL (ref 3.87–5.11)
RDW: 13.9 % (ref 11.5–15.5)
WBC: 5.9 10*3/uL (ref 4.0–10.5)

## 2021-09-22 LAB — LIPID PANEL
Cholesterol: 261 mg/dL — ABNORMAL HIGH (ref 0–200)
HDL: 42.4 mg/dL (ref >39.00)
NonHDL: 218.48
Total CHOL/HDL Ratio: 6
Triglycerides: 257 mg/dL — ABNORMAL HIGH (ref 0.0–149.0)
VLDL: 51.4 mg/dL — ABNORMAL HIGH (ref 0.0–40.0)

## 2021-09-22 LAB — HEPATITIS C ANTIBODY: Hepatitis C Ab: NONREACTIVE

## 2021-09-22 LAB — LDL CHOLESTEROL, DIRECT: Direct LDL: 86 mg/dL

## 2021-09-22 LAB — TSH: TSH: <0.01 u[IU]/mL — ABNORMAL LOW (ref 0.35–5.50)

## 2021-09-22 LAB — VITAMIN D 25 HYDROXY (VIT D DEFICIENCY, FRACTURES): VITD: 51.48 ng/mL (ref 30.00–100.00)

## 2021-09-23 ENCOUNTER — Other Ambulatory Visit: Payer: Self-pay | Admitting: Family Medicine

## 2021-09-23 ENCOUNTER — Telehealth: Payer: Self-pay | Admitting: Family Medicine

## 2021-09-23 ENCOUNTER — Telehealth: Payer: Self-pay | Admitting: *Deleted

## 2021-09-23 DIAGNOSIS — E785 Hyperlipidemia, unspecified: Secondary | ICD-10-CM

## 2021-09-23 DIAGNOSIS — E039 Hypothyroidism, unspecified: Secondary | ICD-10-CM

## 2021-09-23 NOTE — Telephone Encounter (Signed)
Pt would like a nurse to go over her results. She stated if she cannot be reached a vm is okay.

## 2021-09-23 NOTE — Telephone Encounter (Signed)
Patient notified of lab results.  She stated that the last time she took fenofibrate it caused bad diarrhea.  Do you have any other recommendations.

## 2021-09-23 NOTE — Telephone Encounter (Signed)
-----   Message from Ann Held, DO sent at 09/23/2021 12:33 PM EDT ----- Hyperthyroid---- dec synthroid RP594 mcg daily#30  2 refills  Cholesterol--- LDL goal < 100,  HDL >40,  TG < 150.  Diet and exercise will increase HDL and decrease LDL and TG.  Fish,  Fish Oil, Flaxseed oil will also help increase the HDL and decrease Triglycerides.   Recheck labs in 3 mon. Add fenofibrate 160 mg 1 po qd #30 2 refiils

## 2021-10-05 ENCOUNTER — Telehealth: Payer: Self-pay | Admitting: Family Medicine

## 2021-10-05 NOTE — Telephone Encounter (Signed)
Patient called to follow up on lower dose of thyroid medication. She was supposed to be on a lower dose but it has not been sent in to the pharmacy. Patient would like it sent to:   [place], [place]  [place], [place] [address]  Phone:  [phone]  Fax:  [phone]   Patient also wanted to follow up on the Fenofibrate that caused diarrhea and wanted to know what Dr. Etter Sjogren suggested for other options. Please call her back to advise

## 2021-10-06 MED ORDER — LEVOTHYROXINE SODIUM 137 MCG PO TABS: 137.0000 ug | ORAL_TABLET | Freq: Every day | ORAL | 2 refills | Status: DC

## 2021-10-06 NOTE — Telephone Encounter (Signed)
Rx sent for thyroid. And you recommended Vascepa '1000mg'$  but it is not covered by insurance. Please advise

## 2021-10-14 NOTE — Telephone Encounter (Signed)
Pt called. LVM to return call 

## 2021-11-09 NOTE — Telephone Encounter (Signed)
See phone note from 8/28.

## 2021-11-19 ENCOUNTER — Telehealth: Payer: Self-pay | Admitting: Family Medicine

## 2021-11-19 NOTE — Telephone Encounter (Signed)
Pt wanted to make sure she is still advised to take this rx as '137mg'$ , as it was lowered a couple months ago. If so, she is needing a refill.    Medication: levothyroxine (SYNTHROID) 137 MCG tablet  Has the patient contacted their pharmacy? No.   Preferred Pharmacy:  [facility] - [place], [address] [place], [place] [address] Phone: [phone]  Fax: [phone]

## 2021-11-23 ENCOUNTER — Other Ambulatory Visit: Payer: Self-pay | Admitting: Family Medicine

## 2021-11-23 NOTE — Telephone Encounter (Signed)
Is the pt supposed to be on this dose of the Synthroid?

## 2021-11-25 MED ORDER — LEVOTHYROXINE SODIUM 137 MCG PO TABS: 137.0000 ug | ORAL_TABLET | Freq: Every day | ORAL | 2 refills | Status: DC

## 2021-11-25 NOTE — Telephone Encounter (Signed)
Refill sent. LVM advising tha rx was sent and to schedule a lab appointment. Labs ordered

## 2021-12-09 ENCOUNTER — Other Ambulatory Visit (HOSPITAL_COMMUNITY): Payer: Self-pay | Admitting: *Deleted

## 2021-12-09 MED ORDER — APIXABAN 5 MG PO TABS: 5.0000 mg | ORAL_TABLET | Freq: Two times a day (BID) | ORAL | 6 refills | Status: DC

## 2021-12-17 ENCOUNTER — Other Ambulatory Visit: Payer: Self-pay | Admitting: Family Medicine

## 2021-12-21 IMAGING — CT CT ANGIO CHEST
2 of 8 series · 19 of 36 positions shown · IV contrast (omnipaque)
Comparison: None.

CLINICAL DATA: Concern for pulmonary embolism.  Positive D-dimer

EXAM:
CT ANGIOGRAPHY CHEST WITH CONTRAST
TECHNIQUE: Multidetector CT imaging of the chest was performed using the
standard protocol during bolus administration of intravenous
contrast. Multiplanar CT image reconstructions and MIPs were
obtained to evaluate the vascular anatomy.
CONTRAST:  100mL OMNIPAQUE IOHEXOL 350 MG/ML SOLN

[Series 6: pe thins · axial · 0.74mm/px · z∈[-270,-20]mm · 18 of 279 slices shown]
[im 15/279  lung]
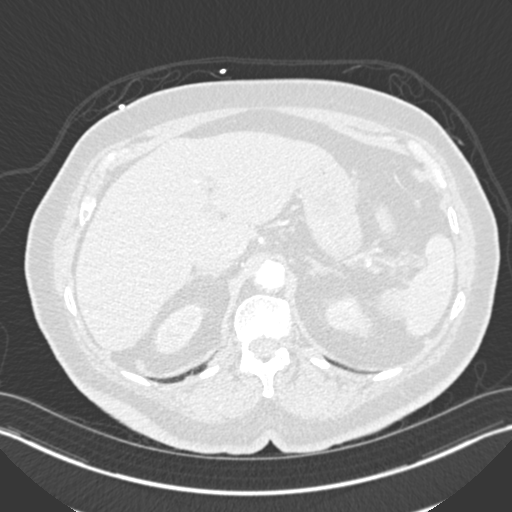
[im 30/279  mediastinal]
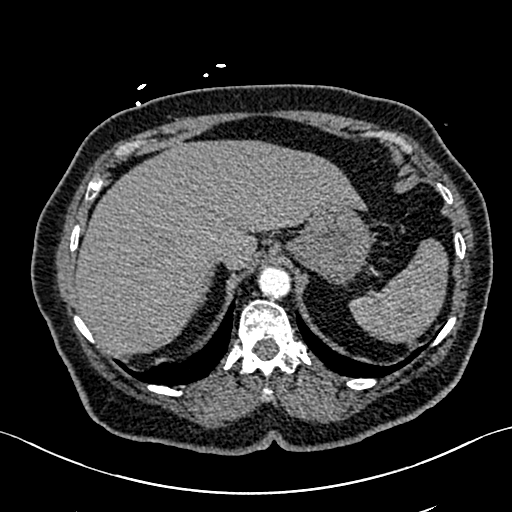
[im 44/279  lung]
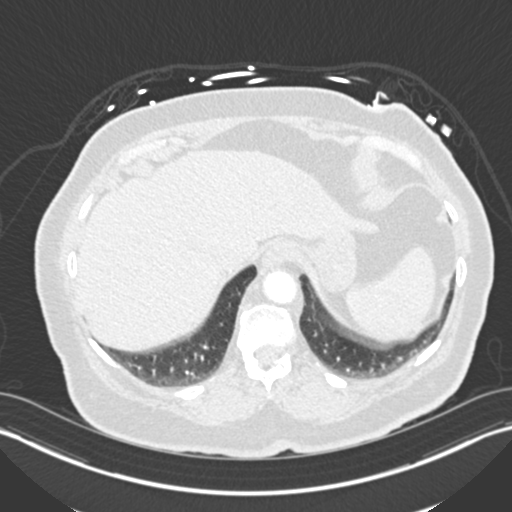
[im 59/279  mediastinal]
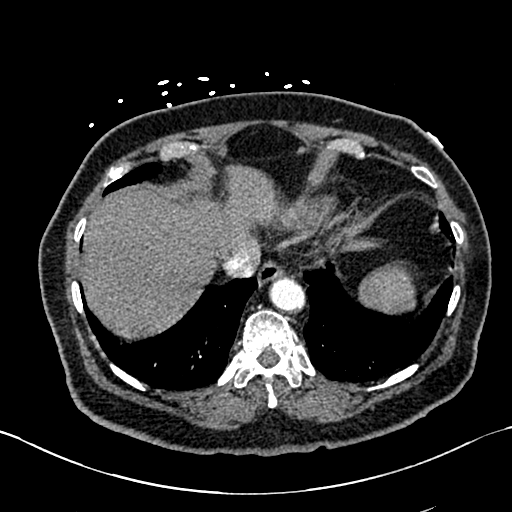
[im 74/279  lung]
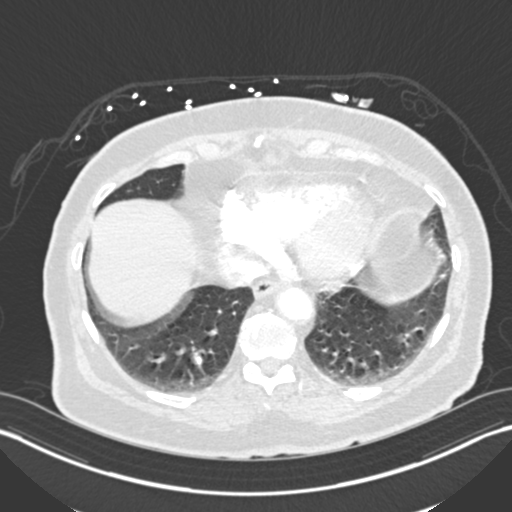
[im 88/279  mediastinal]
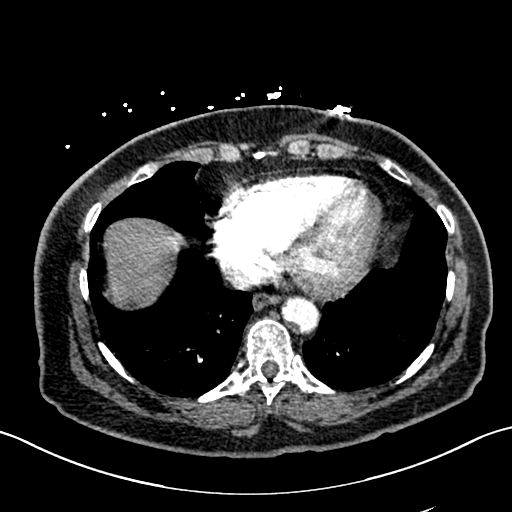
[im 103/279  lung]
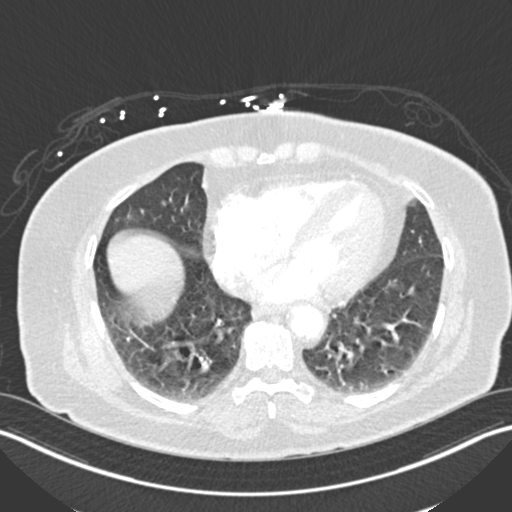
[im 118/279  mediastinal]
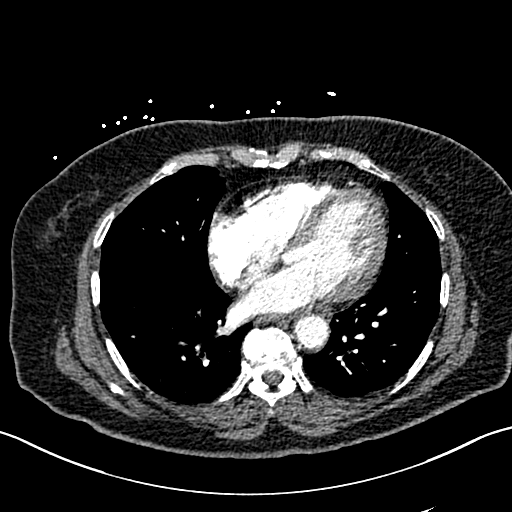
[im 132/279  lung]
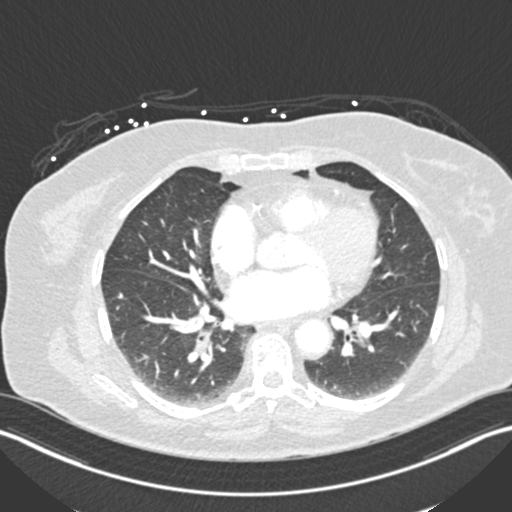
[im 147/279  mediastinal]
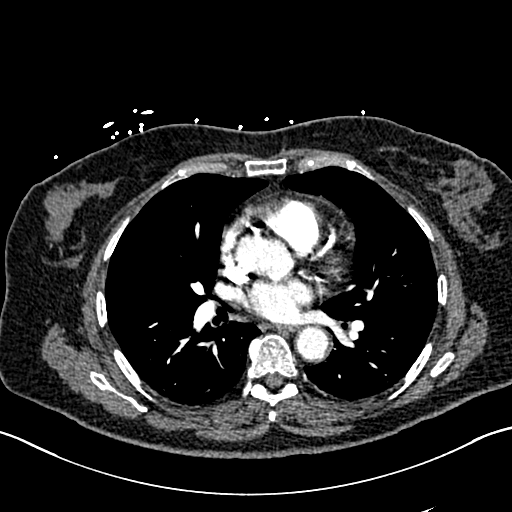
[im 161/279  lung]
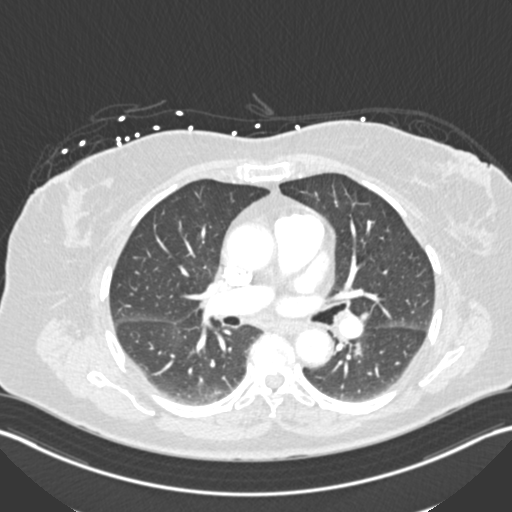
[im 176/279  mediastinal]
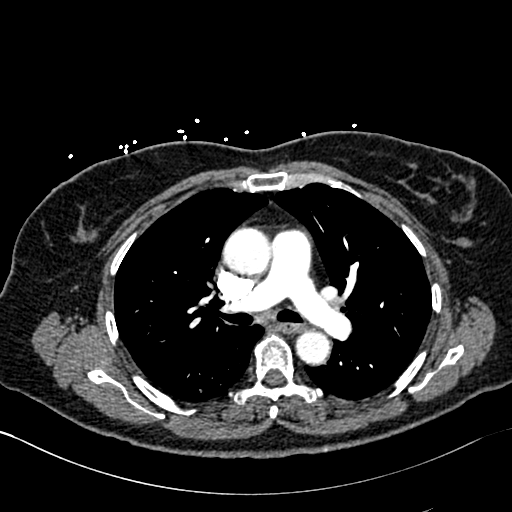
[im 191/279  lung]
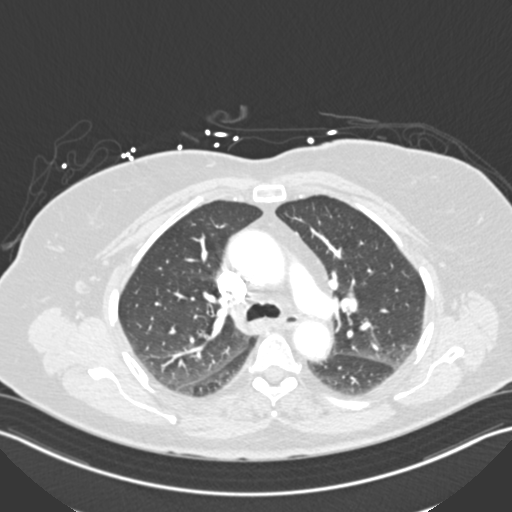
[im 205/279  mediastinal]
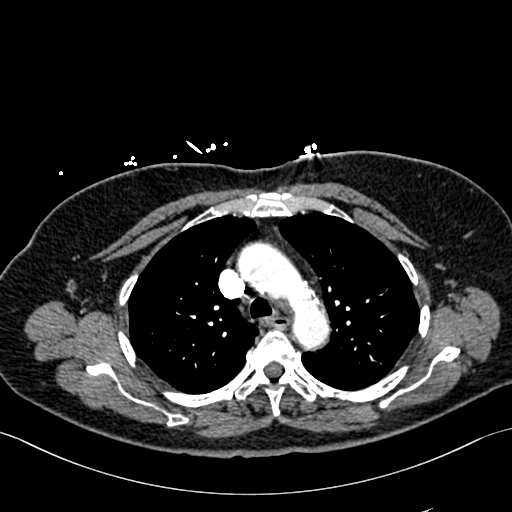
[im 220/279  lung]
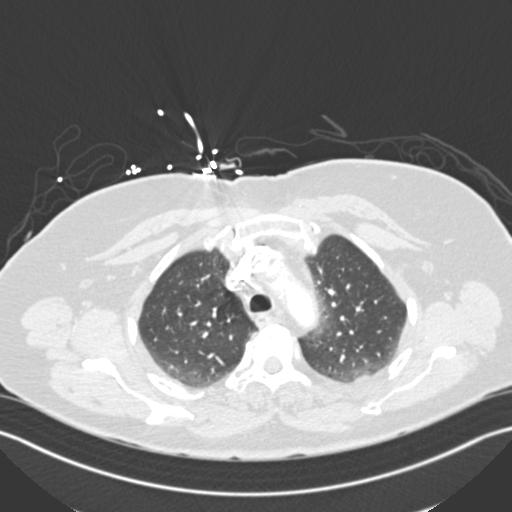
[im 235/279  mediastinal]
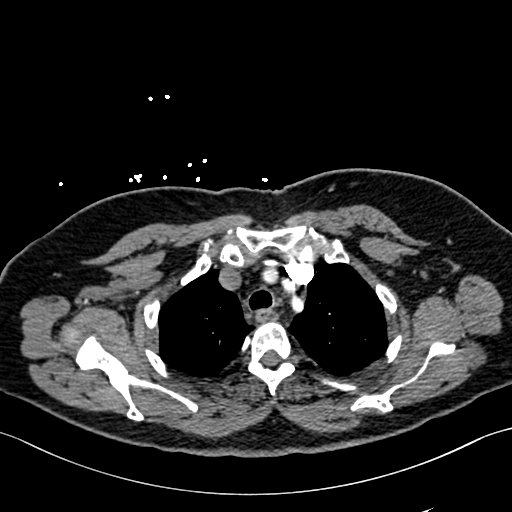
[im 249/279  lung]
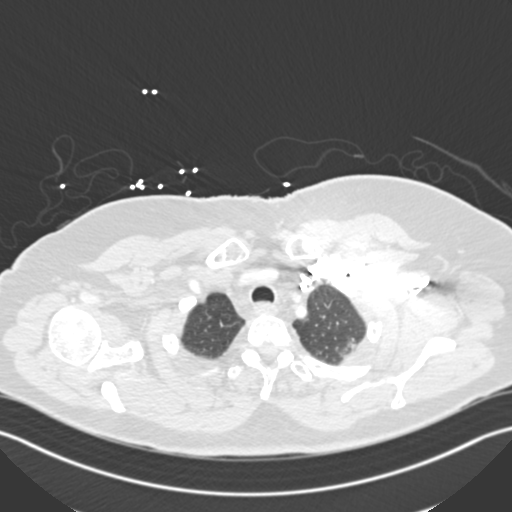
[im 264/279  mediastinal]
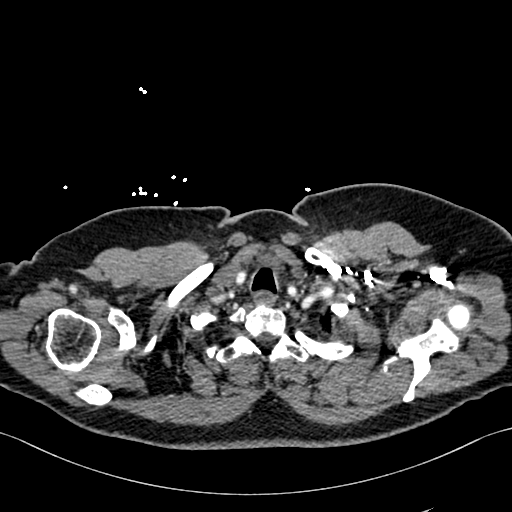

[Series 7: pe coronal mpr · coronal · 0.59mm/px · 1 of 150 slices shown]
[im 75/150  mediastinal]
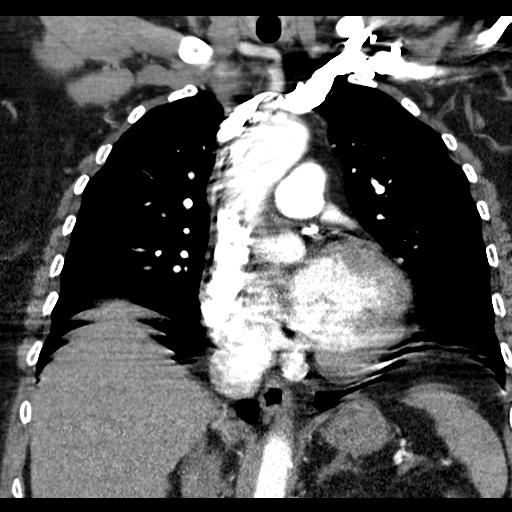

[19 of 36 positions shown; findings below may reference images not displayed]

FINDINGS: Cardiovascular: No filling defects within the pulmonary arteries to
suggest acute pulmonary embolism. Coronary artery calcification and
aortic atherosclerotic calcification.

Mediastinum/Nodes: No axillary or supraclavicular adenopathy. No
mediastinal or hilar adenopathy. No pericardial fluid. Esophagus
normal.

Lungs/Pleura: No pulmonary infarction. No pneumonia. Airways normal.

Upper Abdomen: Limited view of the liver, kidneys, pancreas are
unremarkable. Normal adrenal glands.

Musculoskeletal: No aggressive osseous lesion.

Review of the MIP images confirms the above findings.
IMPRESSION: 1. No acute pulmonary embolism.
2. No acute pulmonary parenchymal findings.
3. Coronary artery calcification and Aortic Atherosclerosis
([id number]).

## 2021-12-21 IMAGING — US US EXTREM LOW VENOUS*R*
1 series · 14 of 24 positions shown · non-contrast
Comparison: None.

CLINICAL DATA: Calf swelling, discomfort, recent lower extremity
fracture

EXAM:
RIGHT LOWER EXTREMITY VENOUS DOPPLER ULTRASOUND
TECHNIQUE: Gray-scale sonography with compression, as well as color and duplex
ultrasound, were performed to evaluate the deep venous system(s)
from the level of the common femoral vein through the popliteal and
proximal calf veins.

[Series 1: us extrem low venous*right* · 14 of 29 slices shown]
[im 1/29]
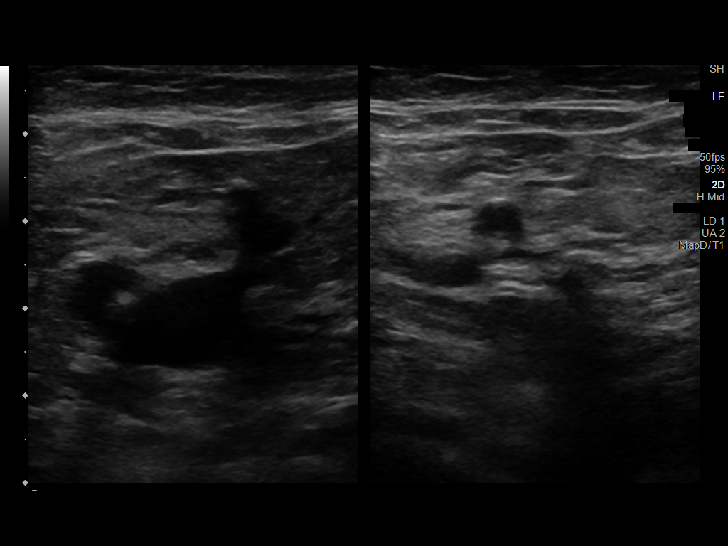
[im 3/29]
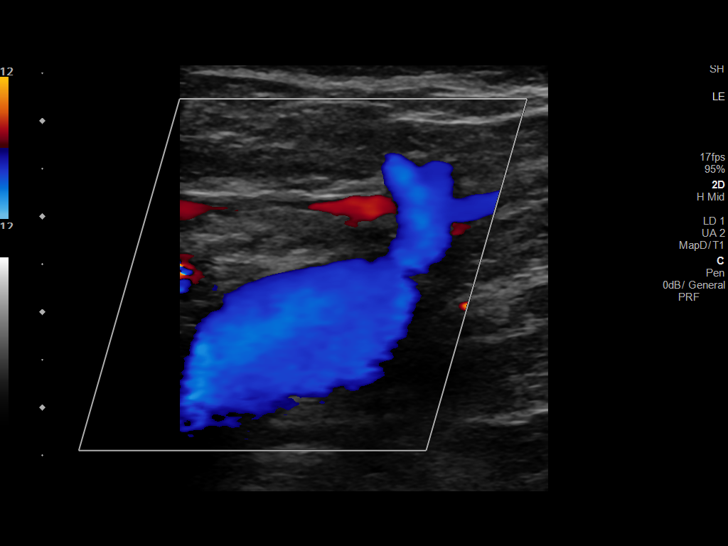
[im 5/29]
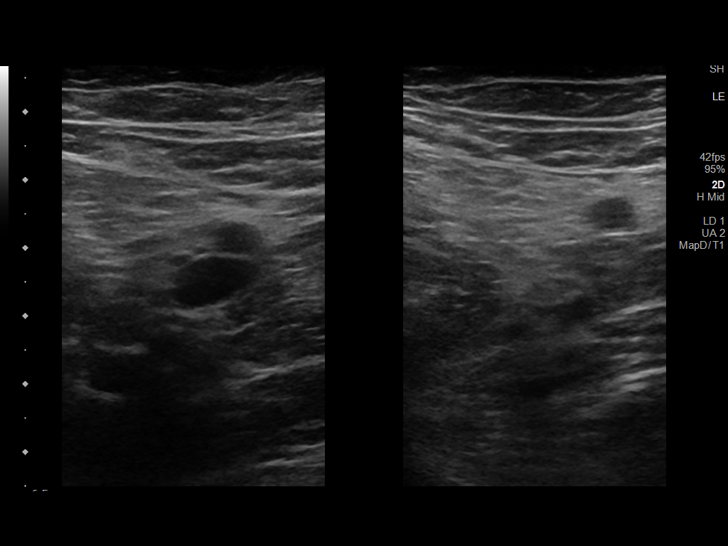
[im 8/29]
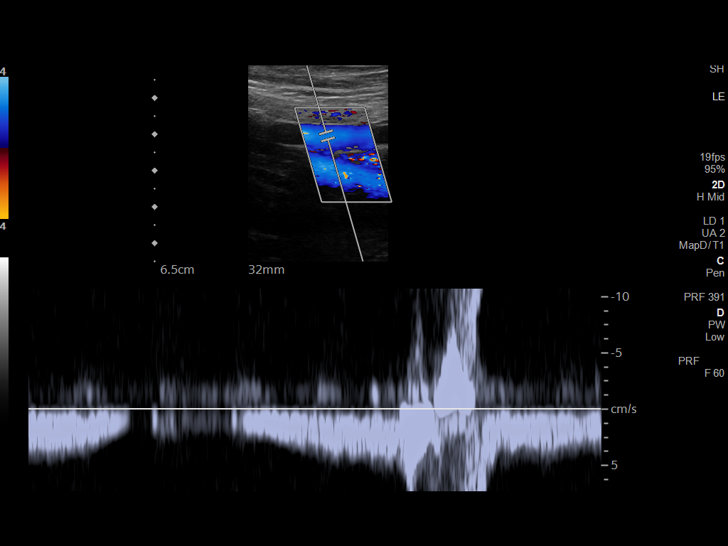
[im 9/29]
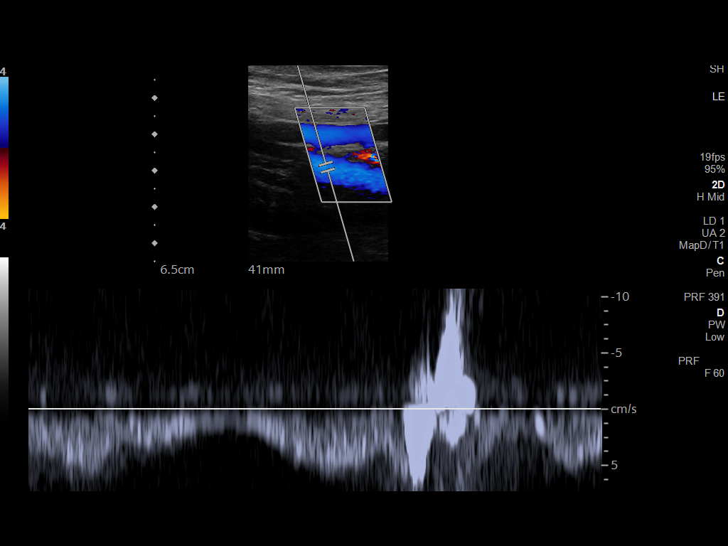
[im 11/29]
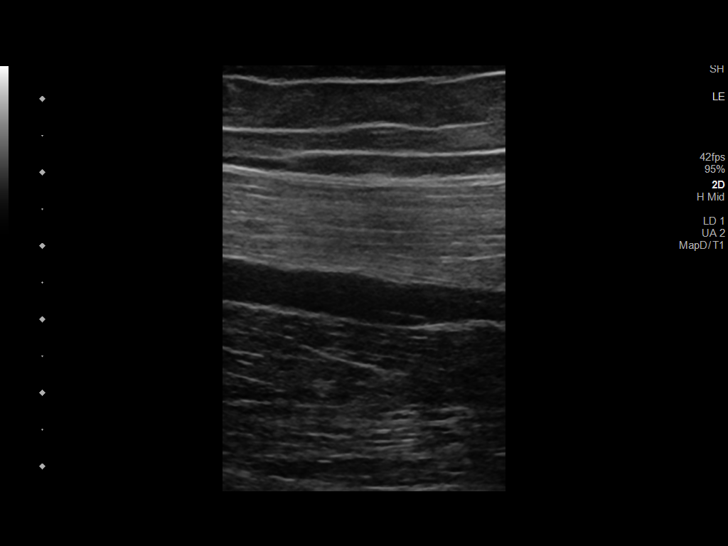
[im 14/29]
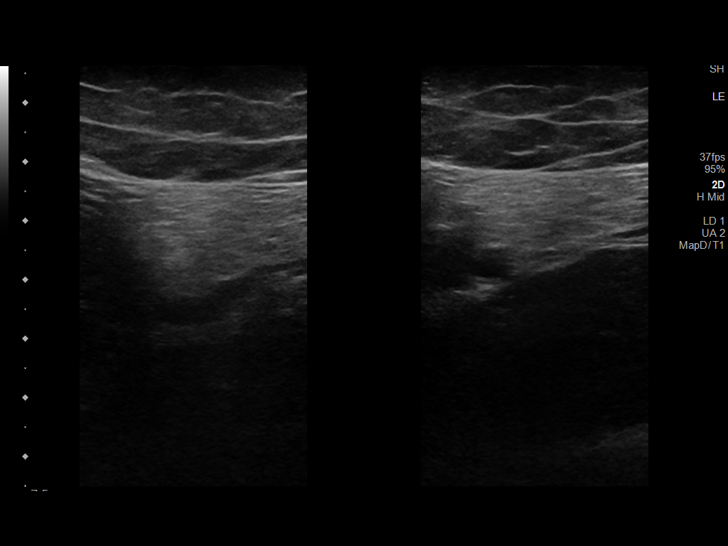
[im 15/29]
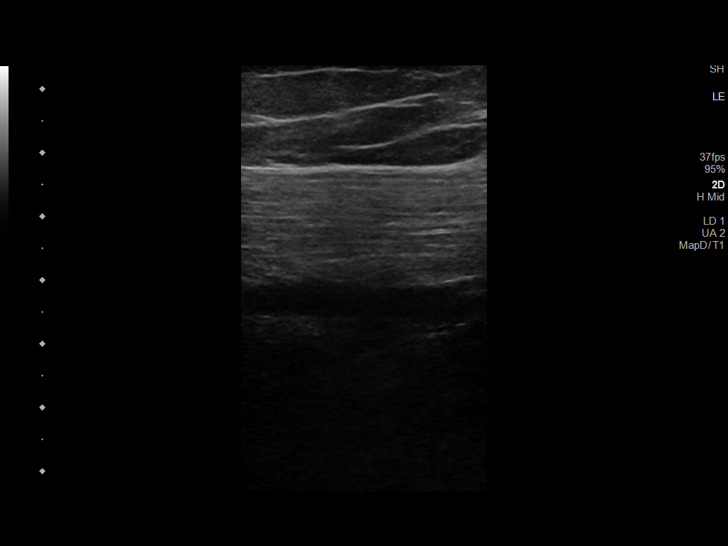
[im 18/29]
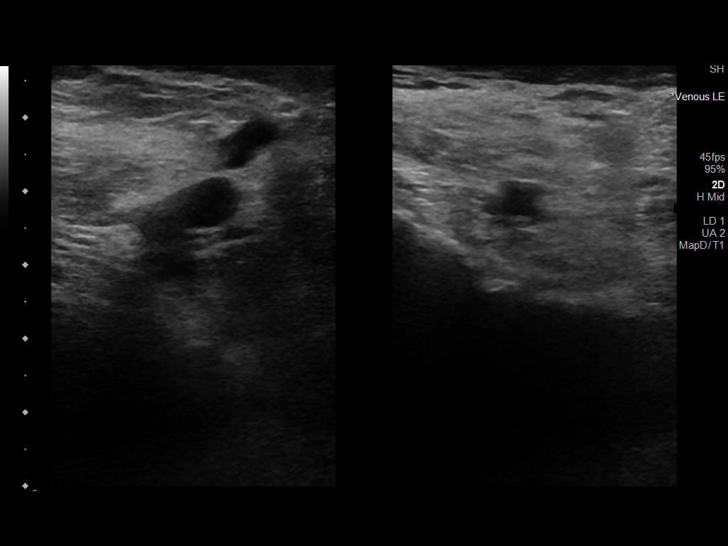
[im 20/29]
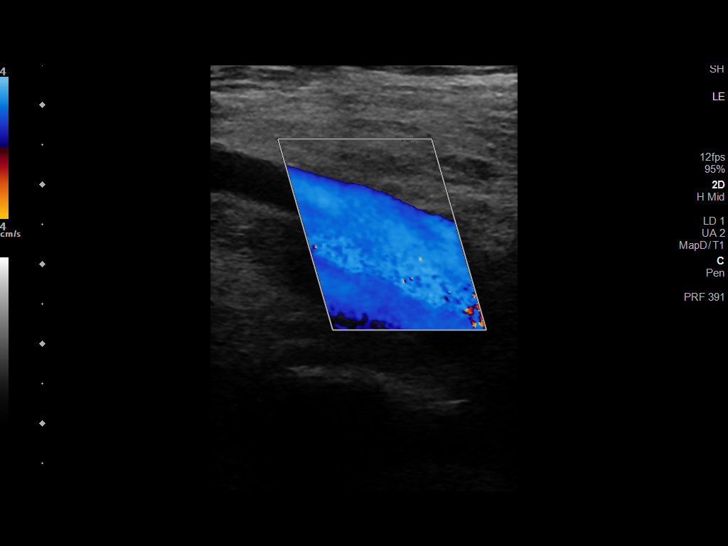
[im 22/29]
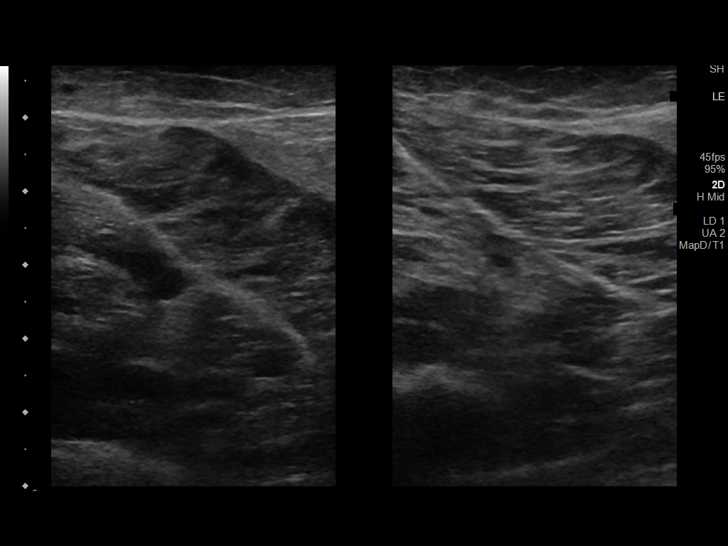
[im 24/29]
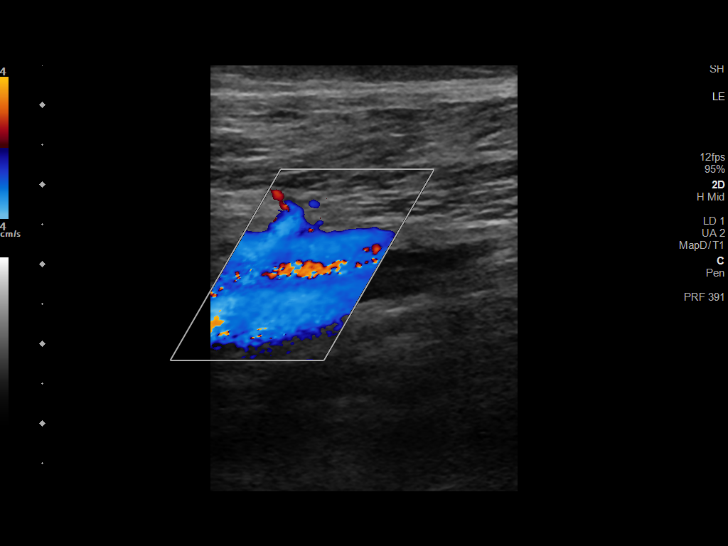
[im 26/29]
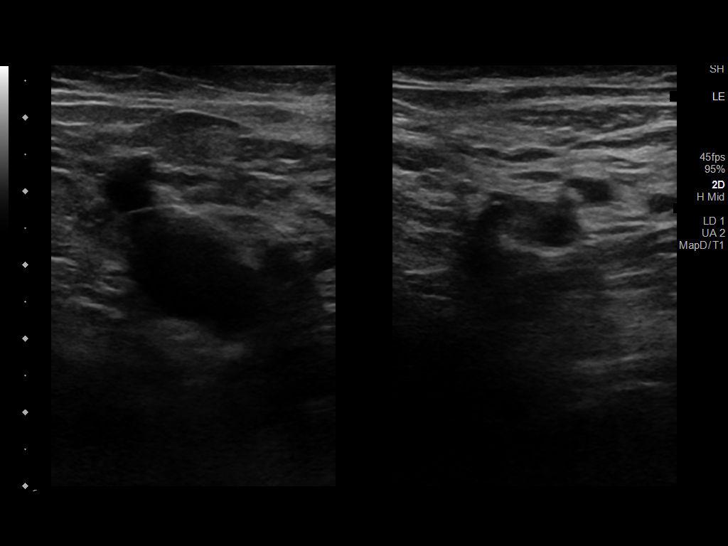
[im 29/29]
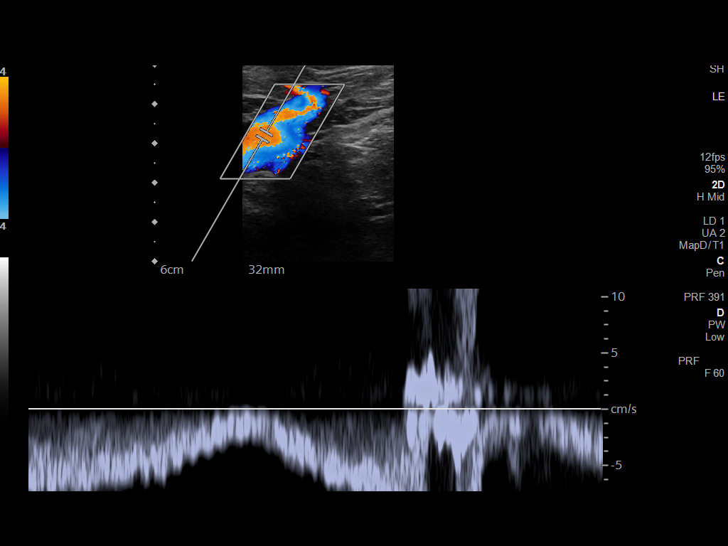

[14 of 24 positions shown; findings below may reference images not displayed]

FINDINGS: VENOUS

Normal compressibility of the common femoral, superficial femoral,
and popliteal veins, as well as the visualized calf veins.
Visualized portions of profunda femoral vein and great saphenous
vein unremarkable. No filling defects to suggest DVT on grayscale or
color Doppler imaging. Doppler waveforms show normal direction of
venous flow, normal respiratory plasticity and response to
augmentation.

Limited views of the contralateral common femoral vein are
unremarkable.

OTHER

None.

Limitations: none
IMPRESSION: 1. No evidence of deep venous thrombosis within the right lower
extremity.

## 2022-01-05 ENCOUNTER — Telehealth: Payer: Self-pay | Admitting: Family Medicine

## 2022-01-05 NOTE — Telephone Encounter (Signed)
Left message for patient to call back and schedule Medicare Annual Wellness Visit (AWV) either virtually or phone . Left  my Samantha Hanson number [phone]   Last AWV  12/03/20 please schedule with Nurse Health Adviser 2   45 min for awv-i and in office appointments 30 min for awv-s  phone/virtual appointments

## 2022-01-07 ENCOUNTER — Ambulatory Visit (INDEPENDENT_AMBULATORY_CARE_PROVIDER_SITE_OTHER): Payer: Medicare HMO | Admitting: *Deleted

## 2022-01-07 DIAGNOSIS — Z780 Asymptomatic menopausal state: Secondary | ICD-10-CM | POA: Diagnosis not present

## 2022-01-07 DIAGNOSIS — Z0000 Encounter for general adult medical examination without abnormal findings: Secondary | ICD-10-CM

## 2022-01-07 DIAGNOSIS — Z1231 Encounter for screening mammogram for malignant neoplasm of breast: Secondary | ICD-10-CM | POA: Diagnosis not present

## 2022-01-07 NOTE — Patient Instructions (Signed)
Samantha Hanson , Thank you for taking time to come for your Medicare Wellness Visit. I appreciate your ongoing commitment to your health goals. Please review the following plan we discussed and let me know if I can assist you in the future.   These are the goals we discussed:  Goals      Patient Stated     Drink more water        This is a list of the screening recommended for you and due dates:  Health Maintenance  Topic Date Due   Colon Cancer Screening  Never done   Zoster (Shingles) Vaccine (1 of 2) Never done   Mammogram  03/05/2012   COVID-19 Vaccine (5 - 2023-24 season) 10/09/2021   Screening for Lung Cancer  12/30/2021   Medicare Annual Wellness Visit  01/08/2023   DTaP/Tdap/Td vaccine (3 - Tdap) 09/22/2031   Pneumonia Vaccine  Completed   Flu Shot  Completed   DEXA scan (bone density measurement)  Completed   Hepatitis C Screening: USPSTF Recommendation to screen - Ages 54-79 yo.  Completed   HPV Vaccine  Aged Out     Next appointment: Follow up in one year for your annual wellness visit.   Preventive Care 52 Years and Older, Female Preventive care refers to lifestyle choices and visits with your health care provider that can promote health and wellness. What does preventive care include? A yearly physical exam. This is also called an annual well check. Dental exams once or twice a year. Routine eye exams. Ask your health care provider how often you should have your eyes checked. Personal lifestyle choices, including: Daily care of your teeth and gums. Regular physical activity. Eating a healthy diet. Avoiding tobacco and drug use. Limiting alcohol use. Practicing safe sex. Taking low-dose aspirin every day. Taking vitamin and mineral supplements as recommended by your health care provider. What happens during an annual well check? The services and screenings done by your health care provider during your annual well check will depend on your age, overall  health, lifestyle risk factors, and family history of disease. Counseling  Your health care provider may ask you questions about your: Alcohol use. Tobacco use. Drug use. Emotional well-being. Home and relationship well-being. Sexual activity. Eating habits. History of falls. Memory and ability to understand (cognition). Work and work Statistician. Reproductive health. Screening  You may have the following tests or measurements: Height, weight, and BMI. Blood pressure. Lipid and cholesterol levels. These may be checked every 5 years, or more frequently if you are over 74 years old. Skin check. Lung cancer screening. You may have this screening every year starting at age 67 if you have a 30-pack-year history of smoking and currently smoke or have quit within the past 15 years. Fecal occult blood test (FOBT) of the stool. You may have this test every year starting at age 59. Flexible sigmoidoscopy or colonoscopy. You may have a sigmoidoscopy every 5 years or a colonoscopy every 10 years starting at age 65. Hepatitis C blood test. Hepatitis B blood test. Sexually transmitted disease (STD) testing. Diabetes screening. This is done by checking your blood sugar (glucose) after you have not eaten for a while (fasting). You may have this done every 1-3 years. Bone density scan. This is done to screen for osteoporosis. You may have this done starting at age 43. Mammogram. This may be done every 1-2 years. Talk to your health care provider about how often you should have regular mammograms. Talk  with your health care provider about your test results, treatment options, and if necessary, the need for more tests. Vaccines  Your health care provider may recommend certain vaccines, such as: Influenza vaccine. This is recommended every year. Tetanus, diphtheria, and acellular pertussis (Tdap, Td) vaccine. You may need a Td booster every 10 years. Zoster vaccine. You may need this after age  57. Pneumococcal 13-valent conjugate (PCV13) vaccine. One dose is recommended after age 40. Pneumococcal polysaccharide (PPSV23) vaccine. One dose is recommended after age 70. Talk to your health care provider about which screenings and vaccines you need and how often you need them. This information is not intended to replace advice given to you by your health care provider. Make sure you discuss any questions you have with your health care provider. Document Released: 02/21/2015 Document Revised: 10/15/2015 Document Reviewed: 11/26/2014 Elsevier Interactive Patient Education  2017 [place] Prevention in the Home Falls can cause injuries. They can happen to people of all ages. There are many things you can do to make your home safe and to help prevent falls. What can I do on the outside of my home? Regularly fix the edges of walkways and driveways and fix any cracks. Remove anything that might make you trip as you walk through a door, such as a raised step or threshold. Trim any bushes or trees on the path to your home. Use bright outdoor lighting. Clear any walking paths of anything that might make someone trip, such as rocks or tools. Regularly check to see if handrails are loose or broken. Make sure that both sides of any steps have handrails. Any raised decks and porches should have guardrails on the edges. Have any leaves, snow, or ice cleared regularly. Use sand or salt on walking paths during winter. Clean up any spills in your garage right away. This includes oil or grease spills. What can I do in the bathroom? Use night lights. Install grab bars by the toilet and in the tub and shower. Do not use towel bars as grab bars. Use non-skid mats or decals in the tub or shower. If you need to sit down in the shower, use a plastic, non-slip stool. Keep the floor dry. Clean up any water that spills on the floor as soon as it happens. Remove soap buildup in the tub or shower  regularly. Attach bath mats securely with double-sided non-slip rug tape. Do not have throw rugs and other things on the floor that can make you trip. What can I do in the bedroom? Use night lights. Make sure that you have a light by your bed that is easy to reach. Do not use any sheets or blankets that are too big for your bed. They should not hang down onto the floor. Have a firm chair that has side arms. You can use this for support while you get dressed. Do not have throw rugs and other things on the floor that can make you trip. What can I do in the kitchen? Clean up any spills right away. Avoid walking on wet floors. Keep items that you use a lot in easy-to-reach places. If you need to reach something above you, use a strong step stool that has a grab bar. Keep electrical cords out of the way. Do not use floor polish or wax that makes floors slippery. If you must use wax, use non-skid floor wax. Do not have throw rugs and other things on the floor that can make you  trip. What can I do with my stairs? Do not leave any items on the stairs. Make sure that there are handrails on both sides of the stairs and use them. Fix handrails that are broken or loose. Make sure that handrails are as long as the stairways. Check any carpeting to make sure that it is firmly attached to the stairs. Fix any carpet that is loose or worn. Avoid having throw rugs at the top or bottom of the stairs. If you do have throw rugs, attach them to the floor with carpet tape. Make sure that you have a light switch at the top of the stairs and the bottom of the stairs. If you do not have them, ask someone to add them for you. What else can I do to help prevent falls? Wear shoes that: Do not have high heels. Have rubber bottoms. Are comfortable and fit you well. Are closed at the toe. Do not wear sandals. If you use a stepladder: Make sure that it is fully opened. Do not climb a closed stepladder. Make sure that  both sides of the stepladder are locked into place. Ask someone to hold it for you, if possible. Clearly mark and make sure that you can see: Any grab bars or handrails. First and last steps. Where the edge of each step is. Use tools that help you move around (mobility aids) if they are needed. These include: Canes. Walkers. Scooters. Crutches. Turn on the lights when you go into a dark area. Replace any light bulbs as soon as they burn out. Set up your furniture so you have a clear path. Avoid moving your furniture around. If any of your floors are uneven, fix them. If there are any pets around you, be aware of where they are. Review your medicines with your doctor. Some medicines can make you feel dizzy. This can increase your chance of falling. Ask your doctor what other things that you can do to help prevent falls. This information is not intended to replace advice given to you by your health care provider. Make sure you discuss any questions you have with your health care provider. Document Released: 11/21/2008 Document Revised: 07/03/2015 Document Reviewed: 03/01/2014 Elsevier Interactive Patient Education  2017 [employer].

## 2022-01-07 NOTE — Progress Notes (Signed)
Subjective:   Samantha Hanson is a 71 y.o. female who presents for Medicare Annual (Subsequent) preventive examination.  I connected with  Samantha Hanson on 01/07/22 by a audio enabled telemedicine application and verified that I am speaking with the correct person using two identifiers.  Patient Location: Home  Provider Location: Office/Clinic  I discussed the limitations of evaluation and management by telemedicine. The patient expressed understanding and agreed to proceed.   Review of Systems    Defer to PCP Cardiac Risk Factors include: advanced age (>72mn, >>43women);dyslipidemia;hypertension     Objective:    Today's Vitals   01/07/22 1507  PainSc: 2    There is no height or weight on file to calculate BMI.     01/07/2022    3:24 PM 12/30/2020    6:08 PM 12/03/2020    3:07 PM 07/01/2015    2:53 PM  Advanced Directives  Does Patient Have a Medical Advance Directive? No No No No  Does patient want to make changes to medical advance directive?    No - Patient declined  Copy of [place] Chart?    No - copy requested  Would patient like information on creating a medical advance directive? No - Patient declined No - Patient declined Yes (MAU/Ambulatory/Procedural Areas - Information given) Yes - Educational materials given    Current Medications (verified) Outpatient Encounter Medications as of 01/07/2022  Medication Sig   apixaban (ELIQUIS) 5 MG TABS tablet Take 1 tablet (5 mg total) by mouth 2 (two) times daily.   calcium carbonate 1250 MG capsule Take 1,250 mg by mouth daily.   Cholecalciferol (VITAMIN D) 2000 UNITS CAPS Take by mouth.   Coenzyme Q10 (CO Q-10 PO) Take by mouth.   Cyanocobalamin (B-12 PO) Take 1 tablet by mouth daily.   fish oil-omega-3 fatty acids 1000 MG capsule Take 1 g by mouth daily.    gabapentin (NEURONTIN) 100 MG capsule Take 1 capsule (100 mg total) by mouth 3 (three) times daily. Pt needs follow up for further  refills   glucosamine-chondroitin 500-400 MG tablet Take 1 tablet by mouth 3 (three) times daily.   lansoprazole (PREVACID) 30 MG capsule Take 1 capsule (30 mg total) by mouth daily as needed.   levothyroxine (SYNTHROID) 137 MCG tablet Take 1 tablet (137 mcg total) by mouth daily before breakfast.   losartan-hydrochlorothiazide (HYZAAR) 100-12.5 MG tablet TAKE 1 TABLET EVERY DAY   methocarbamol (ROBAXIN) 500 MG tablet Take 1 tablet (500 mg total) by mouth 2 (two) times daily.   metoprolol tartrate (LOPRESSOR) 25 MG tablet Take 1 tablet (25 mg total) by mouth 2 (two) times daily.   Multiple Vitamins-Minerals (MULTIVITAMIN PO) Take 1 tablet by mouth daily.   OVER THE COUNTER MEDICATION Primrose oil   Potassium 99 MG TABS Take 99 mg by mouth daily.   simvastatin (ZOCOR) 20 MG tablet TAKE 1 TABLET AT BEDTIME   venlafaxine XR (EFFEXOR-XR) 75 MG 24 hr capsule TAKE 1 CAPSULE EVERY DAY. Pt needs follow up for further refills   [DISCONTINUED] HYDROcodone-acetaminophen (NORCO) 5-325 MG tablet Take 1 tablet by mouth every 6 (six) hours as needed.   No facility-administered encounter medications on file as of 01/07/2022.    Allergies (verified) Fenofibrate   History: Past Medical History:  Diagnosis Date   Hyperlipidemia    Hypertension    Migraine    MVP (mitral valve prolapse)    Thyroid disease    Past Surgical History:  Procedure Laterality  Date   BUNIONECTOMY     Right foot   Family History  Problem Relation Age of Onset   Coronary artery disease Father    Arthritis Father    Heart disease Father        heart transplant candidate but too old--per pt   Thyroid cancer Mother    Osteoporosis Sister    Social History   Socioeconomic History   Marital status: Married    Spouse name: Not on file   Number of children: Not on file   Years of education: Not on file   Highest education level: Not on file  Occupational History   Not on file  Tobacco Use   Smoking status: Every Day     Packs/day: 1.50    Years: 50.00    Total pack years: 75.00    Types: Cigarettes   Smokeless tobacco: Never   Tobacco comments:    1 pack daily 01/07/21  Substance and Sexual Activity   Alcohol use: No   Drug use: No   Sexual activity: Not Currently  Other Topics Concern   Not on file  Social History Narrative   Not on file   Social Determinants of Health   Financial Resource Strain: Low Risk  (01/07/2022)   Overall Financial Resource Strain (CARDIA)    Difficulty of Paying Living Expenses: Not hard at all  Food Insecurity: No Food Insecurity (01/07/2022)   Hunger Vital Sign    Worried About Running Out of Food in the Last Year: Never true    [place] in the Last Year: Never true  Transportation Needs: No Transportation Needs (01/07/2022)   PRAPARE - Hydrologist (Medical): No    Lack of Transportation (Non-Medical): No  Physical Activity: Inactive (01/07/2022)   Exercise Vital Sign    Days of Exercise per Week: 0 days    Minutes of Exercise per Session: 0 min  Stress: No Stress Concern Present (01/07/2022)   [place]    Feeling of Stress : Only a little  Social Connections: Moderately Integrated (01/07/2022)   Social Connection and Isolation Panel [NHANES]    Frequency of Communication with Friends and Family: More than three times a week    Frequency of Social Gatherings with Friends and Family: Once a week    Attends Religious Services: 1 to 4 times per year    Active Member of [employer] or Organizations: No    Attends Archivist Meetings: Never    Marital Status: Married    Tobacco Counseling Ready to quit: Not Answered Counseling given: Not Answered Tobacco comments: 1 pack daily 01/07/21   Clinical Intake:  Pre-visit preparation completed: Yes  Pain : 0-10 Pain Score: 2  Pain Type: Chronic pain Pain Location: Back Pain Descriptors /  Indicators: Aching Pain Onset: More than a month ago Pain Frequency: Constant   Diabetes: No  How often do you need to have someone help you when you read instructions, pamphlets, or other written materials from your doctor or pharmacy?: 1 - Never  Activities of Daily Living    01/07/2022    3:36 PM  In your present state of health, do you have any difficulty performing the following activities:  Hearing? 1  Vision? 1  Difficulty concentrating or making decisions? 1  Walking or climbing stairs? 1  Dressing or bathing? 0  Doing errands, shopping? 0  Preparing Food and eating ?  N  Using the Toilet? N  In the past six months, have you accidently leaked urine? Y  Do you have problems with loss of bowel control? N  Managing your Medications? N  Managing your Finances? N  Housekeeping or managing your Housekeeping? Y  Comment husband helps    Patient Care Team: Carollee Herter, Alferd Apa, DO as PCP - General  Indicate any recent Medical Services you may have received from other than [facility] providers in the past year (date may be approximate).     Assessment:   This is a routine wellness examination for Samantha Hanson.  Hearing/Vision screen No results found.  Dietary issues and exercise activities discussed: Current Exercise Habits: The patient does not participate in regular exercise at present, Exercise limited by: orthopedic condition(s)   Goals Addressed             This Visit's Progress    Patient Stated   Not on track    Drink more water       Depression Screen    01/07/2022    3:17 PM 12/03/2020    3:15 PM 05/20/2020   11:16 AM 07/01/2015    2:55 PM 08/06/2014    8:59 AM 07/13/2012   10:02 AM  PHQ 2/9 Scores  PHQ - 2 Score 1 0 0 0 2 4  PHQ- 9 Score   6   17    Fall Risk    01/07/2022    3:33 PM 12/03/2020    3:10 PM 05/20/2020   11:16 AM 07/01/2015    2:55 PM 08/06/2014    8:59 AM  Fall Risk   Falls in the past year? 1 1 0 Yes No  Number falls in past yr: 1 0  0 1   Injury with Fall? 0 0 0 No   Risk for fall due to : History of fall(s);Impaired balance/gait History of fall(s)  Other (Comment)   Risk for fall due to: Comment    tripped over cats   Follow up Falls evaluation completed Falls prevention discussed  Falls prevention discussed     FALL RISK PREVENTION PERTAINING TO THE HOME:  Any stairs in or around the home? Yes  If so, are there any without handrails? No  Home free of loose throw rugs in walkways, pet beds, electrical cords, etc? Yes  Adequate lighting in your home to reduce risk of falls? Yes   ASSISTIVE DEVICES UTILIZED TO PREVENT FALLS:  Life alert? No  Use of a cane, walker or w/c? No  Grab bars in the bathroom? Yes  Shower chair or bench in shower? Yes  Elevated toilet seat or a handicapped toilet? No   TIMED UP AND GO:  Was the test performed?  No, audio visit .    Cognitive Function:        01/07/2022    3:44 PM  6CIT Screen  What Year? 0 points  What month? 0 points  What time? 0 points  Count back from 20 0 points  Months in reverse 2 points  Repeat phrase 0 points  Total Score 2 points    Immunizations Immunization History  Administered Date(s) Administered   Influenza, High Dose Seasonal PF 12/08/2013, 11/27/2014, 01/13/2018, 11/06/2018   Influenza,inj,Quad PF,6+ Mos 12/06/2012   Influenza-Unspecified 10/10/2015, 12/02/2016, 11/19/2020, 12/07/2021   PFIZER(Purple Top)SARS-COV-2 Vaccination 03/15/2019, 04/09/2019, 01/09/2020   PNEUMOCOCCAL CONJUGATE-20 09/21/2021   Pfizer Covid-19 Vaccine Bivalent Booster 81yr & up 11/19/2020   Pneumococcal Conjugate-13 08/06/2014   Td 08/19/2004,  09/21/2021   Zoster, Live 08/27/2010    TDAP status: Up to date  Flu Vaccine status: Due, Education has been provided regarding the importance of this vaccine. Advised may receive this vaccine at local pharmacy or Health Dept. Aware to provide a copy of the vaccination record if obtained from local pharmacy or  Health Dept. Verbalized acceptance and understanding.  Pneumococcal vaccine status: Up to date  Covid-19 vaccine status: Information provided on how to obtain vaccines.   Qualifies for Shingles Vaccine? Yes   Zostavax completed Yes   Shingrix Completed?: No.    Education has been provided regarding the importance of this vaccine. Patient has been advised to call insurance company to determine out of pocket expense if they have not yet received this vaccine. Advised may also receive vaccine at local pharmacy or Health Dept. Verbalized acceptance and understanding.  Screening Tests Health Maintenance  Topic Date Due   COLONOSCOPY (Pts 45-62yr Insurance coverage will need to be confirmed)  Never done   Zoster Vaccines- Shingrix (1 of 2) Never done   MAMMOGRAM  03/05/2012   COVID-19 Vaccine (5 - 2023-24 season) 10/09/2021   Medicare Annual Wellness (AWV)  12/03/2021   Lung Cancer Screening  12/30/2021   DTaP/Tdap/Td (3 - Tdap) 09/22/2031   Pneumonia Vaccine 71 Years old  Completed   INFLUENZA VACCINE  Completed   DEXA SCAN  Completed   Hepatitis C Screening  Completed   HPV VACCINES  Aged Out    Health Maintenance  Health Maintenance Due  Topic Date Due   COLONOSCOPY (Pts 45-480yrInsurance coverage will need to be confirmed)  Never done   Zoster Vaccines- Shingrix (1 of 2) Never done   MAMMOGRAM  03/05/2012   COVID-19 Vaccine (5 - 2023-24 season) 10/09/2021   Medicare Annual Wellness (AWV)  12/03/2021   Lung Cancer Screening  12/30/2021    Colorectal cancer screening: Type of screening: Cologuard. Completed 12/15/20. Repeat every 3 years  Mammogram status: Ordered 01/07/22. Pt provided with contact info and advised to call to schedule appt.   Bone Density status: Ordered 01/07/22. Pt provided with contact info and advised to call to schedule appt.  Lung Cancer Screening: (Low Dose CT Chest recommended if Age 73-80ears, 30 pack-year currently smoking OR have quit w/in  15years.) does qualify.   Lung Cancer Screening Referral: already in system  Additional Screening:  Hepatitis C Screening: does qualify; Completed 09/21/21  Vision Screening: Recommended annual ophthalmology exams for early detection of glaucoma and other disorders of the eye. Is the patient up to date with their annual eye exam?  No  Who is the provider or what is the name of the office in which the patient attends annual eye exams? Doesn't have new doctor since previous one retired If pt is not established with a provider, would they like to be referred to a provider to establish care? No .   Dental Screening: Recommended annual dental exams for proper oral hygiene  Community Resource Referral / Chronic Care Management: CRR required this visit?  No   CCM required this visit?  No      Plan:     I have personally reviewed and noted the following in the patient's chart:   Medical and social history Use of alcohol, tobacco or illicit drugs  Current medications and supplements including opioid prescriptions. Patient is not currently taking opioid prescriptions. Functional ability and status Nutritional status Physical activity Advanced directives List of other physicians Hospitalizations, surgeries, and ER  visits in previous 12 months Vitals Screenings to include cognitive, depression, and falls Referrals and appointments  In addition, I have reviewed and discussed with patient certain preventive protocols, quality metrics, and best practice recommendations. A written personalized care plan for preventive services as well as general preventive health recommendations were provided to patient.   Due to this being a telephonic visit, the after visit summary with patients personalized plan was offered to patient via mail or my-chart. Per request, patient was mailed a copy of AVS.  Beatris Ship, [place]   01/07/2022   Nurse Notes: None

## 2022-02-16 ENCOUNTER — Other Ambulatory Visit: Payer: Self-pay | Admitting: Family Medicine

## 2022-02-16 DIAGNOSIS — L4050 Arthropathic psoriasis, unspecified: Secondary | ICD-10-CM

## 2022-02-17 ENCOUNTER — Other Ambulatory Visit (HOSPITAL_COMMUNITY): Payer: Self-pay | Admitting: *Deleted

## 2022-02-17 MED ORDER — APIXABAN 5 MG PO TABS: 5.0000 mg | ORAL_TABLET | Freq: Two times a day (BID) | ORAL | 0 refills | Status: DC

## 2022-02-19 ENCOUNTER — Other Ambulatory Visit (HOSPITAL_COMMUNITY): Payer: Self-pay | Admitting: *Deleted

## 2022-02-19 MED ORDER — APIXABAN 5 MG PO TABS: 5.0000 mg | ORAL_TABLET | Freq: Two times a day (BID) | ORAL | 0 refills | Status: DC

## 2022-02-23 ENCOUNTER — Other Ambulatory Visit: Payer: Self-pay | Admitting: Family Medicine

## 2022-02-23 DIAGNOSIS — I10 Essential (primary) hypertension: Secondary | ICD-10-CM

## 2022-05-22 ENCOUNTER — Other Ambulatory Visit: Payer: Self-pay | Admitting: Family Medicine

## 2022-05-22 DIAGNOSIS — I10 Essential (primary) hypertension: Secondary | ICD-10-CM

## 2022-05-29 ENCOUNTER — Other Ambulatory Visit: Payer: Self-pay | Admitting: Family Medicine

## 2022-05-31 ENCOUNTER — Encounter: Payer: Self-pay | Admitting: *Deleted

## 2022-06-11 ENCOUNTER — Other Ambulatory Visit: Payer: Self-pay | Admitting: Family Medicine

## 2022-06-11 DIAGNOSIS — I10 Essential (primary) hypertension: Secondary | ICD-10-CM

## 2022-08-24 ENCOUNTER — Other Ambulatory Visit: Payer: Self-pay | Admitting: Family Medicine

## 2022-08-24 DIAGNOSIS — I10 Essential (primary) hypertension: Secondary | ICD-10-CM

## 2022-09-08 ENCOUNTER — Encounter: Payer: Self-pay | Admitting: Family Medicine

## 2022-09-08 ENCOUNTER — Telehealth: Payer: Self-pay | Admitting: Family Medicine

## 2022-09-08 DIAGNOSIS — I10 Essential (primary) hypertension: Secondary | ICD-10-CM

## 2022-09-08 MED ORDER — LOSARTAN POTASSIUM-HCTZ 100-12.5 MG PO TABS: 1.0000 | ORAL_TABLET | Freq: Every day | ORAL | 0 refills | Status: DC

## 2022-09-08 MED ORDER — LEVOTHYROXINE SODIUM 137 MCG PO TABS: 137.0000 ug | ORAL_TABLET | Freq: Every day | ORAL | 0 refills | Status: DC

## 2022-09-08 MED ORDER — METOPROLOL TARTRATE 25 MG PO TABS: 25.0000 mg | ORAL_TABLET | Freq: Two times a day (BID) | ORAL | 0 refills | Status: DC

## 2022-09-08 NOTE — Telephone Encounter (Signed)
Rxs sent

## 2022-09-08 NOTE — Addendum Note (Signed)
Addended by: Roxanne Gates on: 09/08/2022 01:30 PM   Modules accepted: Orders

## 2022-09-08 NOTE — Telephone Encounter (Signed)
error 

## 2022-09-08 NOTE — Telephone Encounter (Signed)
**  Pt is requesting a 90 day refill of the following medications to avoid a gap in medication. Pt is scheduled for an appt 8.1.24.**  Prescription Request  09/08/2022  Is this a "Controlled Substance" medicine? No  LOV: Visit date not found  What is the name of the medication or equipment?   losartan-hydrochlorothiazide (HYZAAR) 100-12.5 MG tablet [[id number]]   levothyroxine (SYNTHROID) 137 MCG tablet [[id number]]   metoprolol tartrate (LOPRESSOR) 25 MG tablet [[id number]]   Have you contacted your pharmacy to request a refill? No   Which pharmacy would you like this sent to?   [facility] - [place], [place] - [address] [place] [address] Phone: [phone] Fax: [phone]  Patient notified that their request is being sent to the clinical staff for review and that they should receive a response within 2 business days.   Please advise at Mobile There is no such number on file (mobile).

## 2022-09-09 ENCOUNTER — Encounter: Payer: Self-pay | Admitting: Family Medicine

## 2022-09-09 ENCOUNTER — Telehealth (INDEPENDENT_AMBULATORY_CARE_PROVIDER_SITE_OTHER): Payer: Medicare HMO | Admitting: Family Medicine

## 2022-09-09 VITALS — BP 160/115 | HR 88 | Wt 178.0 lb

## 2022-09-09 DIAGNOSIS — I10 Essential (primary) hypertension: Secondary | ICD-10-CM | POA: Diagnosis not present

## 2022-09-09 DIAGNOSIS — E039 Hypothyroidism, unspecified: Secondary | ICD-10-CM | POA: Diagnosis not present

## 2022-09-09 DIAGNOSIS — E785 Hyperlipidemia, unspecified: Secondary | ICD-10-CM

## 2022-09-09 DIAGNOSIS — J449 Chronic obstructive pulmonary disease, unspecified: Secondary | ICD-10-CM | POA: Diagnosis not present

## 2022-09-09 MED ORDER — METOPROLOL TARTRATE 50 MG PO TABS: 50.0000 mg | ORAL_TABLET | Freq: Two times a day (BID) | ORAL | 3 refills | Status: DC

## 2022-09-09 NOTE — Patient Instructions (Signed)
Hypertension, Adult High blood pressure (hypertension) is when the force of blood pumping through the arteries is too strong. The arteries are the blood vessels that carry blood from the heart throughout the body. Hypertension forces the heart to work harder to pump blood and may cause arteries to become narrow or stiff. Untreated or uncontrolled hypertension can lead to a heart attack, heart failure, a stroke, kidney disease, and other problems. A blood pressure reading consists of a higher number over a lower number. Ideally, your blood pressure should be below 120/80. The first ("top") number is called the systolic pressure. It is a measure of the pressure in your arteries as your heart beats. The second ("bottom") number is called the diastolic pressure. It is a measure of the pressure in your arteries as the heart relaxes. What are the causes? The exact cause of this condition is not known. There are some conditions that result in high blood pressure. What increases the risk? Certain factors may make you more likely to develop high blood pressure. Some of these risk factors are under your control, including: Smoking. Not getting enough exercise or physical activity. Being overweight. Having too much fat, sugar, calories, or salt (sodium) in your diet. Drinking too much alcohol. Other risk factors include: Having a personal history of heart disease, diabetes, high cholesterol, or kidney disease. Stress. Having a family history of high blood pressure and high cholesterol. Having obstructive sleep apnea. Age. The risk increases with age. What are the signs or symptoms? High blood pressure may not cause symptoms. Very high blood pressure (hypertensive crisis) may cause: Headache. Fast or irregular heartbeats (palpitations). Shortness of breath. Nosebleed. Nausea and vomiting. Vision changes. Severe chest pain, dizziness, and seizures. How is this diagnosed? This condition is diagnosed by  measuring your blood pressure while you are seated, with your arm resting on a flat surface, your legs uncrossed, and your feet flat on the floor. The cuff of the blood pressure monitor will be placed directly against the skin of your upper arm at the level of your heart. Blood pressure should be measured at least twice using the same arm. Certain conditions can cause a difference in blood pressure between your right and left arms. If you have a high blood pressure reading during one visit or you have normal blood pressure with other risk factors, you may be asked to: Return on a different day to have your blood pressure checked again. Monitor your blood pressure at home for 1 week or longer. If you are diagnosed with hypertension, you may have other blood or imaging tests to help your health care provider understand your overall risk for other conditions. How is this treated? This condition is treated by making healthy lifestyle changes, such as eating healthy foods, exercising more, and reducing your alcohol intake. You may be referred for counseling on a healthy diet and physical activity. Your health care provider may prescribe medicine if lifestyle changes are not enough to get your blood pressure under control and if: Your systolic blood pressure is above 130. Your diastolic blood pressure is above 80. Your personal target blood pressure may vary depending on your medical conditions, your age, and other factors. Follow these instructions at home: Eating and drinking  Eat a diet that is high in fiber and potassium, and low in sodium, added sugar, and fat. An example of this eating plan is called the DASH diet. DASH stands for Dietary Approaches to Stop Hypertension. To eat this way: Eat   plenty of fresh fruits and vegetables. Try to fill one half of your plate at each meal with fruits and vegetables. Eat whole grains, such as whole-wheat pasta, brown rice, or whole-grain bread. Fill about one  fourth of your plate with whole grains. Eat or drink low-fat dairy products, such as skim milk or low-fat yogurt. Avoid fatty cuts of meat, processed or cured meats, and poultry with skin. Fill about one fourth of your plate with lean proteins, such as fish, chicken without skin, beans, eggs, or tofu. Avoid pre-made and processed foods. These tend to be higher in sodium, added sugar, and fat. Reduce your daily sodium intake. Many people with hypertension should eat less than 1,500 mg of sodium a day. Do not drink alcohol if: Your health care provider tells you not to drink. You are pregnant, may be pregnant, or are planning to become pregnant. If you drink alcohol: Limit how much you have to: 0-1 drink a day for women. 0-2 drinks a day for men. Know how much alcohol is in your drink. In the [place] one drink equals one 12 oz bottle of beer (355 mL), one 5 oz glass of wine (148 mL), or one 1 oz glass of hard liquor (44 mL). Lifestyle  Work with your health care provider to maintain a healthy body weight or to lose weight. Ask what an ideal weight is for you. Get at least 30 minutes of exercise that causes your heart to beat faster (aerobic exercise) most days of the week. Activities may include walking, swimming, or biking. Include exercise to strengthen your muscles (resistance exercise), such as Pilates or lifting weights, as part of your weekly exercise routine. Try to do these types of exercises for 30 minutes at least 3 days a week. Do not use any products that contain nicotine or tobacco. These products include cigarettes, chewing tobacco, and vaping devices, such as e-cigarettes. If you need help quitting, ask your health care provider. Monitor your blood pressure at home as told by your health care provider. Keep all follow-up visits. This is important. Medicines Take over-the-counter and prescription medicines only as told by your health care provider. Follow directions carefully. Blood  pressure medicines must be taken as prescribed. Do not skip doses of blood pressure medicine. Doing this puts you at risk for problems and can make the medicine less effective. Ask your health care provider about side effects or reactions to medicines that you should watch for. Contact a health care provider if you: Think you are having a reaction to a medicine you are taking. Have headaches that keep coming back (recurring). Feel dizzy. Have swelling in your ankles. Have trouble with your vision. Get help right away if you: Develop a severe headache or confusion. Have unusual weakness or numbness. Feel faint. Have severe pain in your chest or abdomen. Vomit repeatedly. Have trouble breathing. These symptoms may be an emergency. Get help right away. Call 911. Do not wait to see if the symptoms will go away. Do not drive yourself to the hospital. Summary Hypertension is when the force of blood pumping through your arteries is too strong. If this condition is not controlled, it may put you at risk for serious complications. Your personal target blood pressure may vary depending on your medical conditions, your age, and other factors. For most people, a normal blood pressure is less than 120/80. Hypertension is treated with lifestyle changes, medicines, or a combination of both. Lifestyle changes include losing weight, eating a healthy,   low-sodium diet, exercising more, and limiting alcohol. This information is not intended to replace advice given to you by your health care provider. Make sure you discuss any questions you have with your health care provider. Document Revised: 12/02/2020 Document Reviewed: 12/02/2020 Elsevier Patient Education  2024 [employer]  

## 2022-09-09 NOTE — Assessment & Plan Note (Signed)
Encourage heart healthy diet such as MIND or DASH diet, increase exercise, avoid trans fats, simple carbohydrates and processed foods, consider a krill or fish or flaxseed oil cap daily.  °
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
## 2022-09-09 NOTE — Assessment & Plan Note (Signed)
Check tsh 

## 2022-09-09 NOTE — Assessment & Plan Note (Signed)
Poorly controlled will alter medications, encouraged DASH diet, minimize caffeine and obtain adequate sleep. Report concerning symptoms and follow up as directed and as needed  Inc metoprolol 50 mg bid

## 2022-09-24 ENCOUNTER — Other Ambulatory Visit (INDEPENDENT_AMBULATORY_CARE_PROVIDER_SITE_OTHER): Payer: Medicare HMO

## 2022-09-24 DIAGNOSIS — E039 Hypothyroidism, unspecified: Secondary | ICD-10-CM | POA: Diagnosis not present

## 2022-09-24 DIAGNOSIS — E785 Hyperlipidemia, unspecified: Secondary | ICD-10-CM

## 2022-09-24 LAB — LIPID PANEL
Cholesterol: 272 mg/dL — ABNORMAL HIGH (ref 0–200)
HDL: 36.6 mg/dL — ABNORMAL LOW (ref >39.00)
NonHDL: 235.45
Total CHOL/HDL Ratio: 7
Triglycerides: 365 mg/dL — ABNORMAL HIGH (ref 0.0–149.0)
VLDL: 73 mg/dL — ABNORMAL HIGH (ref 0.0–40.0)

## 2022-09-24 LAB — COMPREHENSIVE METABOLIC PANEL
ALT: 14 U/L (ref 0–35)
AST: 12 U/L (ref 0–37)
Albumin: 4.3 g/dL (ref 3.5–5.2)
Alkaline Phosphatase: 50 U/L (ref 39–117)
BUN: 16 mg/dL (ref 6–23)
CO2: 29 mEq/L (ref 19–32)
Calcium: 9.8 mg/dL (ref 8.4–10.5)
Chloride: 93 mEq/L — ABNORMAL LOW (ref 96–112)
Creatinine, Ser: 0.97 mg/dL (ref 0.40–1.20)
GFR: 57.72 mL/min — ABNORMAL LOW (ref >60.00)
Glucose, Bld: 137 mg/dL — ABNORMAL HIGH (ref 70–99)
Potassium: 3.5 mEq/L (ref 3.5–5.1)
Sodium: 129 mEq/L — ABNORMAL LOW (ref 135–145)
Total Bilirubin: 0.4 mg/dL (ref 0.2–1.2)
Total Protein: 6.9 g/dL (ref 6.0–8.3)

## 2022-09-24 LAB — TSH: TSH: 0.06 u[IU]/mL — ABNORMAL LOW (ref 0.35–5.50)

## 2022-09-24 LAB — LDL CHOLESTEROL, DIRECT: Direct LDL: 91 mg/dL

## 2022-10-01 ENCOUNTER — Encounter: Payer: Self-pay | Admitting: Family Medicine

## 2022-10-01 ENCOUNTER — Telehealth: Payer: Self-pay

## 2022-10-01 MED ORDER — LEVOTHYROXINE SODIUM 125 MCG PO TABS
125.0000 ug | ORAL_TABLET | Freq: Every day | ORAL | 0 refills | Status: DC
Start: 2022-10-01 — End: 2022-12-09

## 2022-10-01 NOTE — Telephone Encounter (Signed)
Pt called this morning. Message sent this morning.

## 2022-10-01 NOTE — Addendum Note (Signed)
Addended by: Wilford Corner on: 10/01/2022 09:59 AM   Modules accepted: Orders

## 2022-10-01 NOTE — Telephone Encounter (Signed)
Patient concerned about a diagnosis of diabetes listed on her records. She does not remember this being addressed and she will like to know why she is not getting a HgbA1c check periodically since 2022.   Will like to speak with provider about this please call patient at your convenience.

## 2022-11-15 ENCOUNTER — Other Ambulatory Visit: Payer: Self-pay | Admitting: Family Medicine

## 2022-11-15 DIAGNOSIS — I10 Essential (primary) hypertension: Secondary | ICD-10-CM

## 2022-11-15 NOTE — Telephone Encounter (Signed)
Pt called stating that she only has 3 days left of this medication and needs to have it sent local to avoid a gap in her meds. Pt would like to have this sent to the following pharmacy:  [facility] [address], [place], [place] [address] P: [phone]

## 2022-11-16 NOTE — Telephone Encounter (Signed)
Pt called back to follow up on refill status. After reviewing chart, noted that medication was sent to the wrong pharmacy. Advised pt a note would be sent back to have this corrected. Routed HP due to internal error.

## 2022-11-17 MED ORDER — LOSARTAN POTASSIUM-HCTZ 100-12.5 MG PO TABS
1.0000 | ORAL_TABLET | Freq: Every day | ORAL | 1 refills | Status: DC
Start: 2022-11-17 — End: 2023-05-10

## 2022-11-17 NOTE — Addendum Note (Signed)
Addended by: Roxanne Gates on: 11/17/2022 10:57 AM   Modules accepted: Orders

## 2022-11-17 NOTE — Telephone Encounter (Signed)
Pt called back and stated that she only has enough of her medication to last her for today. She requested for another message to be sent back. Please advise.

## 2022-11-17 NOTE — Telephone Encounter (Signed)
Pt said that she called [facility] and they advised that it was cancelled so [facility] can fill the Losartan. [facility] is saying that they cannot fill the medication because insurance is saying that it was already filled and the next fill date is in December. Pt has been going back and forth and does not know what to do and would like assistance in figuring out how to fix the issue so she can get the medication. Please call her to advise

## 2022-11-17 NOTE — Telephone Encounter (Signed)
Rx sent to correct pharmacy.

## 2022-11-21 ENCOUNTER — Other Ambulatory Visit: Payer: Self-pay | Admitting: Family Medicine

## 2022-11-21 DIAGNOSIS — F418 Other specified anxiety disorders: Secondary | ICD-10-CM

## 2022-12-09 ENCOUNTER — Other Ambulatory Visit: Payer: Self-pay | Admitting: Family

## 2022-12-09 DIAGNOSIS — E039 Hypothyroidism, unspecified: Secondary | ICD-10-CM

## 2022-12-15 ENCOUNTER — Other Ambulatory Visit (HOSPITAL_COMMUNITY): Payer: Self-pay | Admitting: Physician Assistant

## 2023-01-13 ENCOUNTER — Telehealth: Payer: Self-pay

## 2023-01-13 NOTE — Telephone Encounter (Signed)
Unsuccessful attempts to reach patient on preferred number listed in notes for scheduled AWV. Left message on voicemail okay to reschedule.

## 2023-02-26 ENCOUNTER — Other Ambulatory Visit: Payer: Self-pay | Admitting: Family

## 2023-02-26 DIAGNOSIS — E039 Hypothyroidism, unspecified: Secondary | ICD-10-CM

## 2023-05-10 ENCOUNTER — Other Ambulatory Visit: Payer: Self-pay | Admitting: Family Medicine

## 2023-05-10 DIAGNOSIS — I10 Essential (primary) hypertension: Secondary | ICD-10-CM

## 2023-05-10 DIAGNOSIS — F418 Other specified anxiety disorders: Secondary | ICD-10-CM

## 2023-06-01 ENCOUNTER — Other Ambulatory Visit: Payer: Self-pay | Admitting: Family Medicine

## 2023-06-01 DIAGNOSIS — E039 Hypothyroidism, unspecified: Secondary | ICD-10-CM

## 2023-06-13 ENCOUNTER — Encounter (HOSPITAL_COMMUNITY): Payer: Self-pay | Admitting: Physician Assistant

## 2023-06-13 ENCOUNTER — Ambulatory Visit (HOSPITAL_COMMUNITY)
Admission: RE | Admit: 2023-06-13 | Discharge: 2023-06-13 | Disposition: A | Discharge status: Home / Self Care | Source: Ambulatory Visit | Attending: Physician Assistant | Admitting: Physician Assistant

## 2023-06-13 VITALS — BP 130/92 | HR 93 | Ht 66.0 in | Wt 185.2 lb

## 2023-06-13 DIAGNOSIS — D6869 Other thrombophilia: Secondary | ICD-10-CM

## 2023-06-13 DIAGNOSIS — I480 Paroxysmal atrial fibrillation: Secondary | ICD-10-CM | POA: Diagnosis not present

## 2023-06-13 DIAGNOSIS — I10 Essential (primary) hypertension: Secondary | ICD-10-CM

## 2023-06-13 MED ORDER — METOPROLOL TARTRATE 50 MG PO TABS
50.0000 mg | ORAL_TABLET | Freq: Two times a day (BID) | ORAL | 2 refills | Status: DC
Start: 2023-06-13 — End: 2023-11-30

## 2023-06-13 MED ORDER — APIXABAN 5 MG PO TABS: 5.0000 mg | ORAL_TABLET | Freq: Two times a day (BID) | ORAL | 3 refills | Status: DC

## 2023-06-13 NOTE — Patient Instructions (Signed)
Start metoprolol 50 mg twice a day

## 2023-06-13 NOTE — Progress Notes (Signed)
 Primary Care Physician: Crecencio Dodge, Candida Chalk, DO Primary Cardiologist: Dr Stann Earnest (remotely) Primary Electrophysiologist: none Referring Physician: [facility]   Karys Kwasnik is a 73 y.o. female with a history of hypothyroidism, HLD, HTN, MVP, atrial fibrillation who presents for follow up in the [facility].  The patient was initially diagnosed with atrial fibrillation 12/30/20 after presenting to her PCP for a broken foot. She started having chest discomfort and heart racing while at the PCP office. ECG showed new onset afib and she was sent to the ED. She had been out of metoprolol  for several days. She converted to SR in the ED without medication or DCCV. Patient was started on Eliquis  for stroke prevention. She denies significant snoring or alcohol use.   Patient returns for follow up for atrial fibrillation. Patient reports that she has tachypalpitations about once per week. During this episodes, she also has chest and neck discomfort. The episode lasts about 1-2 hours. There are no specific triggers that she can identify.    Today, she  denies symptoms of shortness of breath, orthopnea, PND, lower extremity edema, dizziness, presyncope, syncope, snoring, daytime somnolence, bleeding, or neurologic sequela. The patient is tolerating medications without difficulties and is otherwise without complaint today.    Atrial Fibrillation Risk Factors:  she does not have symptoms or diagnosis of sleep apnea. she does not have a history of rheumatic fever. she does not have a history of alcohol use. The patient does have a history of early familial atrial fibrillation or other arrhythmias. Sister has afib.   Atrial Fibrillation Management history:  Previous antiarrhythmic drugs: none Previous cardioversions: none Previous ablations: none Anticoagulation history: Eliquis    Past Medical History:  Diagnosis Date   Hyperlipidemia    Hypertension     Migraine    MVP (mitral valve prolapse)    Thyroid  disease     Current Outpatient Medications  Medication Sig Dispense Refill   calcium  carbonate 1250 MG capsule Take 1,250 mg by mouth daily.     Cholecalciferol (VITAMIN D ) 2000 UNITS CAPS Take by mouth.     Coenzyme Q10 (CO Q-10 PO) Take by mouth.     Cyanocobalamin  (B-12 PO) Take 1 tablet by mouth daily.     fish oil-omega-3 fatty acids 1000 MG capsule Take 1 g by mouth daily.      gabapentin  (NEURONTIN ) 100 MG capsule TAKE 1 CAPSULE (100 MG TOTAL) BY MOUTH 3 (THREE) TIMES DAILY. (NEED MD APPOINTMENT FOR REFILLS) 270 capsule 0   glucosamine-chondroitin 500-400 MG tablet Take 1 tablet by mouth 3 (three) times daily.     lansoprazole  (PREVACID ) 30 MG capsule Take 1 capsule (30 mg total) by mouth daily as needed. 90 capsule 0   levothyroxine  (SYNTHROID ) 125 MCG tablet TAKE 1 TABLET EVERY DAY BEFORE BREAKFAST (MAKE APPT. FOR EVALUATION AND/OR LABORATORY TESTING BEFORE FURTHER REFILLS) 30 tablet 0   losartan -hydrochlorothiazide (HYZAAR) 100-12.5 MG tablet Take 1 tablet by mouth daily. 90 tablet 0   methocarbamol  (ROBAXIN ) 500 MG tablet Take 1 tablet (500 mg total) by mouth 2 (two) times daily. 20 tablet 0   metoprolol  tartrate (LOPRESSOR ) 50 MG tablet Take 1 tablet (50 mg total) by mouth 2 (two) times daily. 180 tablet 3   Multiple Vitamins-Minerals (MULTIVITAMIN PO) Take 1 tablet by mouth daily.     OVER THE COUNTER MEDICATION Primrose oil     Potassium 99 MG TABS Take 99 mg by mouth daily.     simvastatin  (  ZOCOR ) 20 MG tablet TAKE 1 TABLET AT BEDTIME 90 tablet 10   venlafaxine  XR (EFFEXOR -XR) 75 MG 24 hr capsule Take 1 capsule (75 mg total) by mouth daily. 90 capsule 0   apixaban  (ELIQUIS ) 5 MG TABS tablet Take 1 tablet (5 mg total) by mouth 2 (two) times daily. 180 tablet 3   No current facility-administered medications for this encounter.    ROS- All systems are reviewed and negative except as per the HPI above.  Physical  Exam: Vitals:   06/13/23 1503  Weight: 84 kg  Height: 5\' 6"  (1.676 m)    GEN: Well nourished, well developed in no acute distress NECK: No JVD CARDIAC: Regular rate and rhythm, no murmurs, rubs, gallops RESPIRATORY:  Clear to auscultation without rales, wheezing or rhonchi  ABDOMEN: Soft, non-tender, non-distended EXTREMITIES:  No edema; No deformity    Wt Readings from Last 3 Encounters:  06/13/23 84 kg  09/09/22 80.7 kg  09/21/21 81.6 kg    EKG today demonstrates  SR, NST Vent. rate 93 BPM PR interval 136 ms QRS duration 76 ms QT/QTcB 346/430 ms   Echo 04/21/21  1. Left ventricular ejection fraction, by estimation, is 55 to 60%. The  left ventricle has normal function. The left ventricle has no regional  wall motion abnormalities. Left ventricular diastolic parameters were  normal. The average left ventricular global longitudinal strain is -17.7 %. The global longitudinal strain is normal.   2. Right ventricular systolic function is normal. The right ventricular  size is normal. There is normal pulmonary artery systolic pressure.   3. The mitral valve is normal in structure. Trivial mitral valve  regurgitation. No evidence of mitral stenosis.   4. The aortic valve is tricuspid. There is moderate calcification of the  aortic valve. There is mild thickening of the aortic valve. Aortic valve  regurgitation is not visualized. Aortic valve sclerosis/calcification is  present, without any evidence of aortic stenosis.   5. The inferior vena cava is normal in size with greater than 50%  respiratory variability, suggesting right atrial pressure of 3 mmHg.   Comparison(s): No significant change from prior study.   Conclusion(s)/Recommendation(s): Otherwise normal echocardiogram, with minor abnormalities described in the report.   Epic records are reviewed at length today  CHA2DS2-VASc Score = 4  The patient's score is based upon: CHF History: 0 HTN History: 1 Diabetes  History: 0 Stroke History: 0 Vascular Disease History: 1 Age Score: 1 Gender Score: 1       ASSESSMENT AND PLAN: Paroxysmal Atrial Fibrillation (ICD10:  I48.0) The patient's CHA2DS2-VASc score is 4, indicating a 4.8% annual risk of stroke.   Patient in SR today but having frequent tachypalpitations. Patient reports that she never started higher dose of BB prescribed by her PCP.  Will increase Lopressor  to 50 mg BID. We also discussed cardiac monitor to evaluate her arrhythmia burden, she deferred for now.  Continue Eliquis  5 mg BID, samples provided and new prescription sent.   Secondary Hypercoagulable State (ICD10:  D68.69) The patient is at significant risk for stroke/thromboembolism based upon her CHA2DS2-VASc Score of 4.  Continue Apixaban  (Eliquis ). No bleeding issues.   CAD/aortic atherosclerosis  Noted on CT On statin Patient agreeable to referral to establish care with a primary cardiologist.   HTN Stable on current regimen   Follow up in the AF clinic in 4-6 weeks.    Myrtha Ates PA-C [facility] [address] [place], [place] [address] [phone] 06/13/2023  3:05 PM

## 2023-06-23 ENCOUNTER — Other Ambulatory Visit (HOSPITAL_COMMUNITY): Payer: Self-pay

## 2023-06-23 MED ORDER — APIXABAN 5 MG PO TABS: 5.0000 mg | ORAL_TABLET | Freq: Two times a day (BID) | ORAL | Status: DC

## 2023-06-23 MED ORDER — APIXABAN 5 MG PO TABS
5.0000 mg | ORAL_TABLET | Freq: Two times a day (BID) | ORAL | Status: AC
Start: 2023-06-13 — End: ?

## 2023-06-23 NOTE — Addendum Note (Signed)
 Addended by: Rosanna Comment F on: 06/23/2023 03:25 PM   Modules accepted: Orders

## 2023-07-25 ENCOUNTER — Ambulatory Visit (HOSPITAL_COMMUNITY): Admitting: Physician Assistant

## 2023-08-09 ENCOUNTER — Other Ambulatory Visit: Payer: Self-pay | Admitting: Family Medicine

## 2023-08-09 ENCOUNTER — Telehealth: Payer: Self-pay | Admitting: Family Medicine

## 2023-08-09 DIAGNOSIS — E039 Hypothyroidism, unspecified: Secondary | ICD-10-CM

## 2023-08-09 NOTE — Telephone Encounter (Signed)
 Lvm to schedule appt.

## 2023-08-09 NOTE — Telephone Encounter (Signed)
Duplicate request. Needs appt

## 2023-08-09 NOTE — Telephone Encounter (Signed)
 Copied from CRM [id number]. Topic: Clinical - Medication Refill >> Aug 09, 2023 10:17 AM Ernestene P wrote: Medication: levothyroxine  (SYNTHROID ) 125 MCG tablet  Has the patient contacted their pharmacy? Yes (Agent: If no, request that the patient contact the pharmacy for the refill. If patient does not wish to contact the pharmacy document the reason why and proceed with request.) (Agent: If yes, when and what did the pharmacy advise?)  This is the patient's preferred pharmacy:  [facility] STORE #[id number] [address], [place] - [address] AT [facility] [address] [place] [address] Phone: [phone] Fax: [phone]   Is this the correct pharmacy for this prescription? Yes If no, delete pharmacy and type the correct one.   Has the prescription been filled recently? No  Is the patient out of the medication? Yes  Has the patient been seen for an appointment in the last year OR does the patient have an upcoming appointment? Yes  Can we respond through MyChart? Yes  Agent: Please be advised that Rx refills may take up to 3 business days. We ask that you follow-up with your pharmacy. >> Aug 09, 2023 10:22 AM Ernestene P wrote: Pt is also request 90 day supply - state if that an issue reach out to her.

## 2023-08-09 NOTE — Telephone Encounter (Signed)
 Needs appt please.

## 2023-08-09 NOTE — Telephone Encounter (Signed)
 Copied from CRM [id number]. Topic: Clinical - Medication Refill >> Aug 09, 2023 10:17 AM Ernestene P wrote: Medication: levothyroxine  (SYNTHROID ) 125 MCG tablet  Has the patient contacted their pharmacy? Yes (Agent: If no, request that the patient contact the pharmacy for the refill. If patient does not wish to contact the pharmacy document the reason why and proceed with request.) (Agent: If yes, when and what did the pharmacy advise?)  This is the patient's preferred pharmacy:  [facility] STORE #[id number] [address], [place] - [address] AT [facility] [address] [place] [address] Phone: [phone] Fax: [phone]   Is this the correct pharmacy for this prescription? Yes If no, delete pharmacy and type the correct one.   Has the prescription been filled recently? No  Is the patient out of the medication? Yes  Has the patient been seen for an appointment in the last year OR does the patient have an upcoming appointment? Yes  Can we respond through MyChart? Yes  Agent: Please be advised that Rx refills may take up to 3 business days. We ask that you follow-up with your pharmacy.

## 2023-08-09 NOTE — Telephone Encounter (Signed)
 Duplicate request

## 2023-08-09 NOTE — Telephone Encounter (Signed)
 Last Fill: 06/02/23  Last OV: 09/09/22 Next OV:   Routing to provider for review/authorization.          Copied from CRM [id number]. Topic: Clinical - Medication Refill >> Aug 09, 2023 10:17 AM Ernestene P wrote: Medication: levothyroxine  (SYNTHROID ) 125 MCG tablet  Has the patient contacted their pharmacy? Yes (Agent: If no, request that the patient contact the pharmacy for the refill. If patient does not wish to contact the pharmacy document the reason why and proceed with request.) (Agent: If yes, when and what did the pharmacy advise?)  This is the patient's preferred pharmacy:  [facility] ST[facility] #[id number] [address], [place] - [address] AT [facility] [address] [place] [address] Phone: [phone] Fax: [phone]   Is this the correct pharmacy for this prescription? Yes If no, delete pharmacy and type the correct one.   Has the prescription been filled recently? No  Is the patient out of the medication? Yes  Has the patient been seen for an appointment in the last year OR does the patient have an upcoming appointment? Yes  Can we respond through MyChart? Yes  Agent: Please be advised that Rx refills may take up to 3 business days. We ask that you follow-up with your pharmacy. >> Aug 09, 2023 11:02 AM Chiquita SQUIBB wrote: Patient is calling back in, stating that she can not schedule an appointment due to needing to find transportation first. Patient is asking if she can just do the lab work to be able to get her medication, patient is also asking if she is going to be okay not taking the medication since she does not really want to do an appointment.  >> Aug 09, 2023 10:22 AM Ernestene P wrote: Pt is also request 90 day supply - state if that an issue reach out to her.

## 2023-08-11 ENCOUNTER — Other Ambulatory Visit: Payer: Self-pay | Admitting: Family Medicine

## 2023-08-11 DIAGNOSIS — E039 Hypothyroidism, unspecified: Secondary | ICD-10-CM

## 2023-08-11 DIAGNOSIS — E785 Hyperlipidemia, unspecified: Secondary | ICD-10-CM

## 2023-08-11 DIAGNOSIS — I10 Essential (primary) hypertension: Secondary | ICD-10-CM

## 2023-08-11 NOTE — Telephone Encounter (Addendum)
 Is patient able to get labs drawn today?   Copied from CRM [id number]. Topic: Clinical - Request for Lab/Test Order >> Aug 11, 2023 11:57 AM Viola F wrote: Reason for CRM: Patient called to follow up on refill request for the levothyroxine . I let her know that she needs an appointment and she said its very hard for her to get a ride there since she doesn't drive. She is able to get a ride today but there are no appointments available with Dr. Antonio Meth. She would like to have lab work for thyroid  today and request a call back at [phone] as soon as the order is put in so she can get bloodwork today.

## 2023-08-15 NOTE — Telephone Encounter (Signed)
Called patient office visit scheduled.

## 2023-08-16 ENCOUNTER — Other Ambulatory Visit (HOSPITAL_BASED_OUTPATIENT_CLINIC_OR_DEPARTMENT_OTHER): Payer: Self-pay

## 2023-08-16 ENCOUNTER — Ambulatory Visit (INDEPENDENT_AMBULATORY_CARE_PROVIDER_SITE_OTHER): Admitting: Family Medicine

## 2023-08-16 ENCOUNTER — Encounter: Payer: Self-pay | Admitting: Family Medicine

## 2023-08-16 VITALS — BP 128/80 | HR 84 | Temp 98.3°F | Resp 18 | Ht 67.0 in | Wt 190.2 lb

## 2023-08-16 DIAGNOSIS — I10 Essential (primary) hypertension: Secondary | ICD-10-CM

## 2023-08-16 DIAGNOSIS — I700 Atherosclerosis of aorta: Secondary | ICD-10-CM

## 2023-08-16 DIAGNOSIS — I480 Paroxysmal atrial fibrillation: Secondary | ICD-10-CM | POA: Diagnosis not present

## 2023-08-16 DIAGNOSIS — E785 Hyperlipidemia, unspecified: Secondary | ICD-10-CM | POA: Diagnosis not present

## 2023-08-16 DIAGNOSIS — E039 Hypothyroidism, unspecified: Secondary | ICD-10-CM | POA: Diagnosis not present

## 2023-08-16 DIAGNOSIS — N3281 Overactive bladder: Secondary | ICD-10-CM | POA: Diagnosis not present

## 2023-08-16 MED ORDER — SHINGRIX 50 MCG/0.5ML IM SUSR
0.5000 mL | Freq: Once | INTRAMUSCULAR | 0 refills | Status: AC
  Filled 2023-08-16: qty 0.5, 1d supply, fill #0

## 2023-08-16 MED ORDER — TOLTERODINE TARTRATE ER 2 MG PO CP24: 4.0000 mg | ORAL_CAPSULE | Freq: Every day | ORAL | 3 refills | Status: DC

## 2023-08-16 MED ORDER — LEVOTHYROXINE SODIUM 125 MCG PO TABS: 125.0000 ug | ORAL_TABLET | Freq: Every day | ORAL | 0 refills | Status: DC

## 2023-08-16 MED ORDER — LEVOTHYROXINE SODIUM 125 MCG PO TABS: 125.0000 ug | ORAL_TABLET | Freq: Every day | ORAL | 3 refills | Status: AC

## 2023-08-16 NOTE — Assessment & Plan Note (Signed)
Check tsh On synthroid  

## 2023-08-16 NOTE — Assessment & Plan Note (Signed)
 Well controlled, no changes to meds. Encouraged heart healthy diet such as the DASH diet and exercise as tolerated.

## 2023-08-16 NOTE — Progress Notes (Signed)
 z  Established Patient Office Visit  Subjective   Patient ID: Samantha Hanson, female    DOB: 09-Jun-1950  Age: 73 y.o. MRN: [id number]  Chief Complaint  Patient presents with   Hypertension   Hypothyroidism   Hyperlipidemia   Follow-up    HPI Discussed the use of AI scribe software for clinical note transcription with the patient, who gave verbal consent to proceed.  History of Present Illness Samantha Hanson is a 73 year old female who presents with medication refill requests and urinary incontinence.  She has been without her thyroid  medication for approximately four to five days, possibly up to a week. In the interim, she has been taking an old prescription of 150 mg every other day, which is not the correct dosage. She has encountered difficulties with the centralized call system while attempting to refill her prescription.  She experiences urinary incontinence, describing it as an urgency issue where urine 'sometimes just runs out' when she stands up in the morning. She uses adult diapers, sometimes changing them up to four times a day due to the incontinence, which she finds costly. The incontinence worsens the further she is from the bathroom. No burning sensation during urination. She has been trying to reduce her tea intake, acknowledging that caffeine exacerbates her symptoms. She is trying to avoid drinking close to bedtime to reduce nighttime urination.  She is currently not taking a muscle relaxer, methocarbamol , which was prescribed by another provider, possibly for muscle spasms, but she does not recall ever using it.  No swelling in the ankles at the time of the visit.   Patient Active Problem List   Diagnosis Date Noted   Secondary hypercoagulable state (HCC) 01/07/2021   Other chest pain 12/30/2020   Paroxysmal atrial fibrillation (HCC) 12/30/2020   Acute bilateral low back pain 06/22/2017   Psoriatic arthritis (HCC) 06/22/2017   COPD exacerbation (HCC) 06/22/2017    Aortic atherosclerosis (HCC) 06/22/2017   Falls frequently 03/12/2016   Peripheral neuropathy 07/13/2012   Memory loss 07/13/2012   Abnormal stress test 06/09/2012   Cellulitis and abscess of right scalp 05/09/2012   Sebaceous cyst 05/05/2012   Eczema 05/05/2012   HTN (hypertension) 10/29/2011   Headache 10/29/2011   Benign neoplasm of skin 02/23/2010   PAIN IN JOINT, MULTIPLE SITES 02/23/2010   Asymptomatic postmenopausal status 11/12/2008   Essential hypertension 06/27/2007   Hypothyroidism 06/20/2007   Hyperlipidemia 06/20/2007   Anxiety state 06/20/2007   CIGARETTE SMOKER 06/20/2007   ESOPHAGEAL REFLUX 06/20/2007   Past Medical History:  Diagnosis Date   Hyperlipidemia    Hypertension    Migraine    MVP (mitral valve prolapse)    Thyroid  disease    Past Surgical History:  Procedure Laterality Date   BUNIONECTOMY     Right foot   Social History   Tobacco Use   Smoking status: Every Day    Current packs/day: 1.50    Average packs/day: 1.5 packs/day for 50.0 years (75.0 ttl pk-yrs)    Types: Cigarettes   Smokeless tobacco: Never   Tobacco comments:    1 pack daily 01/07/21  Substance Use Topics   Alcohol use: No   Drug use: No   Social History   Socioeconomic History   Marital status: Married    Spouse name: Not on file   Number of children: Not on file   Years of education: Not on file   Highest education level: Not on file  Occupational History   Not on  file  Tobacco Use   Smoking status: Every Day    Current packs/day: 1.50    Average packs/day: 1.5 packs/day for 50.0 years (75.0 ttl pk-yrs)    Types: Cigarettes   Smokeless tobacco: Never   Tobacco comments:    1 pack daily 01/07/21  Substance and Sexual Activity   Alcohol use: No   Drug use: No   Sexual activity: Not Currently  Other Topics Concern   Not on file  Social History Narrative   Not on file   Social Drivers of Health   Financial Resource Strain: Low Risk  (01/07/2022)    Overall Financial Resource Strain (CARDIA)    Difficulty of Paying Living Expenses: Not hard at all  Food Insecurity: No Food Insecurity (01/07/2022)   Hunger Vital Sign    Worried About Running Out of Food in the Last Year: Never true    Ran Out of Food in the Last Year: Never true  Transportation Needs: No Transportation Needs (01/07/2022)   PRAPARE - Administrator, Civil Service (Medical): No    Lack of Transportation (Non-Medical): No  Physical Activity: Inactive (01/07/2022)   Exercise Vital Sign    Days of Exercise per Week: 0 days    Minutes of Exercise per Session: 0 min  Stress: No Stress Concern Present (01/07/2022)   [employer] of Occupational Health - Occupational Stress Questionnaire    Feeling of Stress : Only a little  Social Connections: Moderately Integrated (01/07/2022)   Social Connection and Isolation Panel    Frequency of Communication with Friends and Family: More than three times a week    Frequency of Social Gatherings with Friends and Family: Once a week    Attends Religious Services: 1 to 4 times per year    Active Member of [employer] or Organizations: No    Attends Banker Meetings: Never    Marital Status: Married  Catering manager Violence: Not At Risk (01/07/2022)   Humiliation, Afraid, Rape, and Kick questionnaire    Fear of Current or Ex-Partner: No    Emotionally Abused: No    Physically Abused: No    Sexually Abused: No   Family Status  Relation Name Status   Father  Alive   Mother  Deceased   Sister  Alive  No partnership data on file   Family History  Problem Relation Age of Onset   Coronary artery disease Father    Arthritis Father    Heart disease Father        heart transplant candidate but too old--per pt   Thyroid  cancer Mother    Osteoporosis Sister    Allergies  Allergen Reactions   Fenofibrate  Diarrhea      Review of Systems  Constitutional:  Negative for fever and malaise/fatigue.  HENT:   Negative for congestion.   Eyes:  Negative for blurred vision.  Respiratory:  Negative for cough and shortness of breath.   Cardiovascular:  Negative for chest pain, palpitations and leg swelling.  Gastrointestinal:  Negative for abdominal pain, blood in stool, nausea and vomiting.  Genitourinary:  Negative for dysuria and frequency.  Musculoskeletal:  Negative for back pain and falls.  Skin:  Negative for rash.  Neurological:  Negative for dizziness, loss of consciousness and headaches.  Endo/Heme/Allergies:  Negative for environmental allergies.  Psychiatric/Behavioral:  Negative for depression. The patient is not nervous/anxious.       Objective:     BP 128/80 (BP Location: Left Arm, Patient  Position: Sitting, Cuff Size: Large)   Pulse 84   Temp 98.3 F (36.8 C) (Oral)   Resp 18   Ht 5' 7 (1.702 m)   Wt 190 lb 3.2 oz (86.3 kg)   SpO2 97%   BMI 29.79 kg/m  BP Readings from Last 3 Encounters:  08/16/23 128/80  06/13/23 (!) 130/92  09/09/22 (!) 160/115   Wt Readings from Last 3 Encounters:  08/16/23 190 lb 3.2 oz (86.3 kg)  06/13/23 185 lb 3.2 oz (84 kg)  09/09/22 178 lb (80.7 kg)   SpO2 Readings from Last 3 Encounters:  08/16/23 97%  09/09/22 96%  09/21/21 97%      Physical Exam Vitals and nursing note reviewed.  Constitutional:      General: She is not in acute distress.    Appearance: Normal appearance. She is well-developed.  HENT:     Head: Normocephalic and atraumatic.  Eyes:     General: No scleral icterus.       Right eye: No discharge.        Left eye: No discharge.  Cardiovascular:     Rate and Rhythm: Normal rate and regular rhythm.     Heart sounds: No murmur heard. Pulmonary:     Effort: Pulmonary effort is normal. No respiratory distress.     Breath sounds: Normal breath sounds.  Musculoskeletal:        General: Normal range of motion.     Cervical back: Normal range of motion and neck supple.     Right lower leg: No edema.     Left  lower leg: No edema.  Skin:    General: Skin is warm and dry.  Neurological:     General: No focal deficit present.     Mental Status: She is alert and oriented to person, place, and time.  Psychiatric:        Mood and Affect: Mood normal.        Behavior: Behavior normal.        Thought Content: Thought content normal.        Judgment: Judgment normal.      No results found for any visits on 08/16/23.  Last CBC Lab Results  Component Value Date   WBC 5.9 09/21/2021   HGB 13.5 09/21/2021   HCT 41.1 09/21/2021   MCV 89.8 09/21/2021   MCH 28.6 12/30/2020   RDW 13.9 09/21/2021   PLT 252.0 09/21/2021   Last metabolic panel Lab Results  Component Value Date   GLUCOSE 137 (H) 09/24/2022   NA 129 (L) 09/24/2022   K 3.5 09/24/2022   CL 93 (L) 09/24/2022   CO2 29 09/24/2022   BUN 16 09/24/2022   CREATININE 0.97 09/24/2022   GFR 57.72 (L) 09/24/2022   CALCIUM  9.8 09/24/2022   PROT 6.9 09/24/2022   ALBUMIN 4.3 09/24/2022   BILITOT 0.4 09/24/2022   ALKPHOS 50 09/24/2022   AST 12 09/24/2022   ALT 14 09/24/2022   ANIONGAP 13 12/30/2020   Last lipids Lab Results  Component Value Date   CHOL 272 (H) 09/24/2022   HDL 36.60 (L) 09/24/2022   LDLCALC 262 (H) 06/05/2013   LDLDIRECT 91.0 09/24/2022   TRIG 365.0 (H) 09/24/2022   CHOLHDL 7 09/24/2022   Last hemoglobin A1c Lab Results  Component Value Date   HGBA1C 6.6 (H) 05/20/2020   Last thyroid  functions Lab Results  Component Value Date   TSH 0.06 (L) 09/24/2022   Last vitamin D  Lab Results  Component Value Date   VD25OH 51.48 09/21/2021   Last vitamin B12 and Folate Lab Results  Component Value Date   VITAMINB12 1,301 (H) 05/20/2020   FOLATE >20.0 ng/mL 11/12/2008      The 10-year ASCVD risk score (Arnett DK, et al., 2019) is: 29.9%    Assessment & Plan:  Assessment and Plan Assessment & Plan Urinary Incontinence   Urinary incontinence is characterized by urgency and involuntary leakage upon  standing, especially in the morning, likely due to post-menopausal changes. There are no symptoms of a urinary tract infection. She finds adult diapers costly and inconvenient. Discussed medication for bladder control, Kegel exercises, caffeine reduction, and fluid intake timing as potential interventions. She is willing to try these. Obtain a urine sample for dipstick analysis to rule out infection. Prescribe bladder control medication to be sent to [facility]. Encourage Kegel exercises. Advise reducing caffeine intake and avoiding fluids two hours before bedtime. Discuss potential referral to a female physical therapist specializing in pelvic floor therapy.  Hypothyroidism   Hypothyroidism is inadequately managed due to a lack of medication for several days. She has been using an old prescription of 150 mcg every other day temporarily. Prefers immediate access to medication through [facility] and mail delivery through [facility] for future refills. Send a 90-day prescription for thyroid  medication to [facility] and an additional prescription to [facility] for mail delivery.  + Problem List Items Addressed This Visit       Unprioritized   Paroxysmal atrial fibrillation [facility])   Per cardiology      Hypothyroidism - Primary (Chronic)   Check tsh On synthroid       Relevant Medications   levothyroxine  (SYNTHROID ) 125 MCG tablet   Other Relevant Orders   TSH   Hyperlipidemia   Encourage heart healthy diet such as MIND or DASH diet, increase exercise, avoid trans fats, simple carbohydrates and processed foods, consider a krill or fish or flaxseed oil cap daily.        Relevant Orders   CBC with Differential/Platelet   Comprehensive metabolic panel with GFR   Lipid panel   Essential hypertension   Well controlled, no changes to meds. Encouraged heart healthy diet such as the DASH diet and exercise as tolerated.        Relevant Orders   CBC with Differential/Platelet   Comprehensive  metabolic panel with GFR   Lipid panel   Aortic atherosclerosis (HCC)   Pt is on statin Check labs       Other Visit Diagnoses       Overactive bladder       Relevant Medications   tolterodine  (DETROL  LA) 2 MG 24 hr capsule   Other Relevant Orders   POCT Urinalysis Dipstick (Automated)       Return in about 6 months (around 02/16/2024), or if symptoms worsen or fail to improve, for annual exam, fasting.    Calliope Delangel R Lowne Chase, DO

## 2023-08-16 NOTE — Assessment & Plan Note (Signed)
 Per cardiology

## 2023-08-16 NOTE — Assessment & Plan Note (Signed)
Pt is on statin Check labs

## 2023-08-16 NOTE — Assessment & Plan Note (Signed)
 Encourage heart healthy diet such as MIND or DASH diet, increase exercise, avoid trans fats, simple carbohydrates and processed foods, consider a krill or fish or flaxseed oil cap daily.

## 2023-08-16 NOTE — Patient Instructions (Signed)
 Peeing Often (Urinary Frequency) in Adults Urinary frequency means peeing, or urinating, more often than usual. This is also called overactive bladder. People usually pee 6 or 7 times in 24 hours. If you're concerned that you pee more often than usual, or if peeing often is starting to affect your day-to-day life, you may have an overactive bladder. How often you pee depends on: How much you drink and what you drink. How much you exercise. What medicines you're taking. The health of your bladder muscles and pelvic muscles. If you pee often, you may also feel an urgent need to pee. The stress and anxiety of needing to find a bathroom quickly can make this problem feel worse. This problem may go away on its own. You may also wish to seek treatment. Home treatments are the first step in treating the problem. Surgery may be done if home treatment fails. Follow these instructions at home: Bladder health Your health care provider can suggest ways to help you improve your bladder health. You may be told to: Start a bladder diary. Keep track of these things: How often you pee. How much you pee at one time. What you eat and drink. If you leak any pee. Follow a bladder training program. You may be told to: Wait before going to the bathroom. In doing this, you learn to ignore some of the urges to pee. Wait a minute after you pee and try to pee again. This helps if you feel you are not completely emptying your bladder. Set a time to pee. The goal is to slowly increase the amount of time in which you wait to pee. Do Kegel exercises. These exercises help the muscles that control peeing to become stronger. This may help you control the urge to pee.  Eating and drinking Eat and drink as told. You may be told to: Avoid caffeine. Drink small amounts of fluid more often. Avoid drinking in the evening. Stop drinking alcohol. Avoid foods or drinks that may irritate the bladder, such as: Coffee, tea, or  soda. Artificial sweeteners. Citrus fruits, tomato-based foods, and chocolate. Urinary frequency may be worse if you have trouble pooping (constipation). You may need to take these steps to help prevent or treat the problem: Take medicines to help you poop. Eat foods high in fiber, like beans, whole grains, and fresh fruits and vegetables. Drink more fluids as told. General instructions Take your medicines only as told. Keep all follow-up visits. Your provider needs to check if the home treatments are working or if you need more treatment. Contact a health care provider if: You start peeing more than before. You feel pain when you pee. You notice blood in your pee. Your pee looks cloudy. You have a fever or chills. You throw up or you feel like you may throw up. Get help right away if: You can't pee. This information is not intended to replace advice given to you by your health care provider. Make sure you discuss any questions you have with your health care provider. Document Revised: 11/04/2022 Document Reviewed: 11/04/2022 Elsevier Patient Education  2025 [employer].

## 2023-08-17 LAB — COMPREHENSIVE METABOLIC PANEL WITH GFR
ALT: 23 U/L (ref 0–35)
AST: 22 U/L (ref 0–37)
Albumin: 4.3 g/dL (ref 3.5–5.2)
Alkaline Phosphatase: 39 U/L (ref 39–117)
BUN: 23 mg/dL (ref 6–23)
CO2: 27 meq/L (ref 19–32)
Calcium: 9.8 mg/dL (ref 8.4–10.5)
Chloride: 99 meq/L (ref 96–112)
Creatinine, Ser: 1.56 mg/dL — ABNORMAL HIGH (ref 0.40–1.20)
GFR: 32.43 mL/min — ABNORMAL LOW (ref >60.00)
Glucose, Bld: 110 mg/dL — ABNORMAL HIGH (ref 70–99)
Potassium: 4.4 meq/L (ref 3.5–5.1)
Sodium: 135 meq/L (ref 135–145)
Total Bilirubin: 0.4 mg/dL (ref 0.2–1.2)
Total Protein: 7.2 g/dL (ref 6.0–8.3)

## 2023-08-17 LAB — TSH: TSH: 2.37 u[IU]/mL (ref 0.35–5.50)

## 2023-08-17 LAB — LIPID PANEL
Cholesterol: 280 mg/dL — ABNORMAL HIGH (ref 0–200)
HDL: 37.9 mg/dL — ABNORMAL LOW (ref >39.00)
NonHDL: 241.62
Total CHOL/HDL Ratio: 7
Triglycerides: 478 mg/dL — ABNORMAL HIGH (ref 0.0–149.0)
VLDL: 95.6 mg/dL — ABNORMAL HIGH (ref 0.0–40.0)

## 2023-08-17 LAB — CBC WITH DIFFERENTIAL/PLATELET
Basophils Absolute: 0.1 K/uL (ref 0.0–0.1)
Basophils Relative: 1.4 % (ref 0.0–3.0)
Eosinophils Absolute: 0.1 K/uL (ref 0.0–0.7)
Eosinophils Relative: 1.5 % (ref 0.0–5.0)
HCT: 40.5 % (ref 36.0–46.0)
Hemoglobin: 13.3 g/dL (ref 12.0–15.0)
Lymphocytes Relative: 16.8 % (ref 12.0–46.0)
Lymphs Abs: 1.2 K/uL (ref 0.7–4.0)
MCHC: 32.8 g/dL (ref 30.0–36.0)
MCV: 89.2 fl (ref 78.0–100.0)
Monocytes Absolute: 0.4 K/uL (ref 0.1–1.0)
Monocytes Relative: 5.7 % (ref 3.0–12.0)
Neutro Abs: 5.1 K/uL (ref 1.4–7.7)
Neutrophils Relative %: 74.6 % (ref 43.0–77.0)
Platelets: 243 K/uL (ref 150.0–400.0)
RBC: 4.54 Mil/uL (ref 3.87–5.11)
RDW: 14.1 % (ref 11.5–15.5)
WBC: 6.9 K/uL (ref 4.0–10.5)

## 2023-08-17 LAB — LDL CHOLESTEROL, DIRECT: Direct LDL: 82 mg/dL

## 2023-08-18 ENCOUNTER — Ambulatory Visit: Payer: Self-pay | Admitting: Family Medicine

## 2023-08-22 ENCOUNTER — Other Ambulatory Visit: Payer: Self-pay | Admitting: Family Medicine

## 2023-08-22 DIAGNOSIS — F418 Other specified anxiety disorders: Secondary | ICD-10-CM

## 2023-08-29 ENCOUNTER — Other Ambulatory Visit: Payer: Self-pay | Admitting: Family Medicine

## 2023-08-29 ENCOUNTER — Telehealth: Payer: Self-pay

## 2023-08-29 DIAGNOSIS — I10 Essential (primary) hypertension: Secondary | ICD-10-CM

## 2023-08-29 NOTE — Telephone Encounter (Signed)
 Copied from CRM [id number]. Topic: Clinical - Medication Prior Auth >> Aug 29, 2023  4:43 PM Viola FALCON wrote: [facility] called regarding the Tolterodine  (DETROL  LA) 2 MG 24 hr capsule medication. It needs a prior authorization.

## 2023-08-30 ENCOUNTER — Telehealth: Payer: Self-pay

## 2023-08-30 ENCOUNTER — Other Ambulatory Visit: Payer: Self-pay | Admitting: Family Medicine

## 2023-08-30 ENCOUNTER — Other Ambulatory Visit (HOSPITAL_COMMUNITY): Payer: Self-pay

## 2023-08-30 DIAGNOSIS — N3281 Overactive bladder: Secondary | ICD-10-CM

## 2023-08-30 MED ORDER — TOLTERODINE TARTRATE ER 4 MG PO CP24: 4.0000 mg | ORAL_CAPSULE | Freq: Every day | ORAL | 1 refills | Status: DC

## 2023-08-30 NOTE — Telephone Encounter (Signed)
 Pharmacy Patient Advocate Encounter   Received notification from Pt Calls Messages that prior authorization for Tolterodine  2mg  caps is required/requested.   Insurance verification completed.   The patient is insured through [employer] .   Per test claim:  Tolterodine  4mg  caps is preferred by the insurance.  If suggested medication is appropriate, Please send in a new RX and discontinue this one. If not, please advise as to why it's not appropriate so that we may request a Prior Authorization. Please note, some preferred medications may still require a PA.  If the suggested medications have not been trialed and there are no contraindications to their use, the PA will not be submitted, as it will not be approved.   Can the order be changed to the 4mg  caps to take 1 capsule daily?  The insurance rejects for the 2mg  to take 2 caps (4mg ) daily but will pay for 1 cap daily.   Please advise. PA has not been submitted.

## 2023-11-01 ENCOUNTER — Telehealth: Payer: Self-pay | Admitting: Neurology

## 2023-11-01 NOTE — Telephone Encounter (Signed)
 Copied from CRM #[id number]. Topic: Clinical - Prescription Issue >> Nov 01, 2023 11:29 AM Hamdi H wrote: Reason for CRM: [facility] called asking for a clarification on a medication for tolterodine  2 MG tablet. The best call back number is [phone], any associate can take the clarification.

## 2023-11-01 NOTE — Telephone Encounter (Signed)
 Copied from CRM [id number]. Topic: Clinical - Medication Question >> Oct 31, 2023  3:28 PM Thersia BROCKS wrote: Reason for RMF:[facility]jw[facility] , followup on fax simvastatin  (ZOCOR ) 20 MG table , stated they will be refaxing it to the office

## 2023-11-03 ENCOUNTER — Other Ambulatory Visit: Payer: Self-pay | Admitting: Family Medicine

## 2023-11-03 DIAGNOSIS — N3281 Overactive bladder: Secondary | ICD-10-CM

## 2023-11-03 NOTE — Telephone Encounter (Unsigned)
 Copied from CRM [id number]. Topic: Clinical - Medication Refill >> Nov 03, 2023 11:32 AM Charlet HERO wrote: Medication: simvastatin  (ZOCOR ) 20 MG tablet tolterodine  (DETROL  LA) 4 MG 24 hr capsule Informed to  contact office [phone]  This is the patient's preferred pharmacy:    [facility] - [place], [place] - [address] [place] [address] Phone: [phone] Fax: [phone]  [facility] [address], [address] [place] [address] Phone: [phone] Fax: [phone]    Is this the correct pharmacy for this prescription? Yes If no, delete pharmacy and type the correct one.   Has the prescription been filled recently? Yes  Is the patient out of the medication? Yes  Has the patient been seen for an appointment in the last year OR does the patient have an upcoming appointment? Yes  Can we respond through MyChart? Yes  Agent: Please be advised that Rx refills may take up to 3 business days. We ask that you follow-up with your pharmacy.

## 2023-11-03 NOTE — Telephone Encounter (Signed)
 Med refill

## 2023-11-04 MED ORDER — TOLTERODINE TARTRATE ER 4 MG PO CP24
4.0000 mg | ORAL_CAPSULE | Freq: Every day | ORAL | 1 refills | Status: AC
Start: 2023-11-04 — End: ?

## 2023-11-04 MED ORDER — SIMVASTATIN 20 MG PO TABS: 20.0000 mg | ORAL_TABLET | Freq: Every day | ORAL | 1 refills | Status: AC

## 2023-11-29 ENCOUNTER — Other Ambulatory Visit: Payer: Self-pay | Admitting: Family Medicine

## 2023-11-29 DIAGNOSIS — I10 Essential (primary) hypertension: Secondary | ICD-10-CM

## 2023-12-29 ENCOUNTER — Telehealth: Payer: Self-pay | Admitting: Family Medicine

## 2023-12-29 NOTE — Telephone Encounter (Signed)
 Copied from CRM #[id number]. Topic: Medicare AWV >> Dec 29, 2023 10:07 AM Nathanel DEL wrote: Called LVM 12/29/2023 to sched AWV. Please schedule in office or virtual visit.   Nathanel Paschal; Care Guide Ambulatory Clinical Support [place] [facility] Direct Dial: [phone]

## 2024-01-20 ENCOUNTER — Other Ambulatory Visit (HOSPITAL_COMMUNITY): Payer: Self-pay | Admitting: *Deleted

## 2024-01-20 DIAGNOSIS — I480 Paroxysmal atrial fibrillation: Secondary | ICD-10-CM

## 2024-01-20 DIAGNOSIS — D6869 Other thrombophilia: Secondary | ICD-10-CM

## 2024-01-20 NOTE — Progress Notes (Addendum)
 Pt called wanting to move forward with making an appointment with general cardiology that was previously recommended. They did try to reach out to her but she was not ready at that time but calling today because she is ready. Referral placed as recommended from [facility] for them to contact pt again.

## 2024-01-30 ENCOUNTER — Other Ambulatory Visit: Payer: Self-pay | Admitting: Family Medicine

## 2024-01-30 DIAGNOSIS — F418 Other specified anxiety disorders: Secondary | ICD-10-CM

## 2024-01-30 LAB — OPHTHALMOLOGY REPORT-SCANNED

## 2024-02-06 ENCOUNTER — Other Ambulatory Visit (HOSPITAL_BASED_OUTPATIENT_CLINIC_OR_DEPARTMENT_OTHER): Payer: Self-pay

## 2024-02-06 MED ORDER — FLUZONE HIGH-DOSE 0.5 ML IM SUSY
0.5000 mL | PREFILLED_SYRINGE | Freq: Once | INTRAMUSCULAR | 0 refills | Status: AC
  Filled 2024-02-06: qty 0.5, 1d supply, fill #0

## 2024-02-06 MED ORDER — SHINGRIX 50 MCG/0.5ML IM SUSR
0.5000 mL | Freq: Once | INTRAMUSCULAR | 0 refills | Status: AC
  Filled 2024-02-06: qty 0.5, 1d supply, fill #0

## 2024-02-10 ENCOUNTER — Encounter: Payer: Self-pay | Admitting: Family Medicine

## 2024-02-10 ENCOUNTER — Ambulatory Visit (HOSPITAL_BASED_OUTPATIENT_CLINIC_OR_DEPARTMENT_OTHER)
Admission: RE | Admit: 2024-02-10 | Discharge: 2024-02-10 | Disposition: A | Discharge status: Home / Self Care | Source: Ambulatory Visit | Attending: Family Medicine | Admitting: Family Medicine

## 2024-02-10 ENCOUNTER — Ambulatory Visit: Admitting: Family Medicine

## 2024-02-10 VITALS — BP 140/80 | HR 62 | Temp 98.0°F | Resp 18 | Ht 67.0 in | Wt 184.2 lb

## 2024-02-10 DIAGNOSIS — J441 Chronic obstructive pulmonary disease with (acute) exacerbation: Secondary | ICD-10-CM | POA: Diagnosis not present

## 2024-02-10 DIAGNOSIS — I10 Essential (primary) hypertension: Secondary | ICD-10-CM | POA: Diagnosis not present

## 2024-02-10 DIAGNOSIS — I480 Paroxysmal atrial fibrillation: Secondary | ICD-10-CM

## 2024-02-10 DIAGNOSIS — E785 Hyperlipidemia, unspecified: Secondary | ICD-10-CM | POA: Diagnosis not present

## 2024-02-10 DIAGNOSIS — E039 Hypothyroidism, unspecified: Secondary | ICD-10-CM | POA: Diagnosis not present

## 2024-02-10 DIAGNOSIS — F17210 Nicotine dependence, cigarettes, uncomplicated: Secondary | ICD-10-CM

## 2024-02-10 DIAGNOSIS — I700 Atherosclerosis of aorta: Secondary | ICD-10-CM | POA: Diagnosis not present

## 2024-02-10 LAB — CBC WITH DIFFERENTIAL/PLATELET
Basophils Absolute: 0.1 K/uL (ref 0.0–0.1)
Basophils Relative: 1.2 % (ref 0.0–3.0)
Eosinophils Absolute: 0.2 K/uL (ref 0.0–0.7)
Eosinophils Relative: 3.1 % (ref 0.0–5.0)
HCT: 41.8 % (ref 36.0–46.0)
Hemoglobin: 13.5 g/dL (ref 12.0–15.0)
Lymphocytes Relative: 25.3 % (ref 12.0–46.0)
Lymphs Abs: 1.3 K/uL (ref 0.7–4.0)
MCHC: 32.4 g/dL (ref 30.0–36.0)
MCV: 88.7 fl (ref 78.0–100.0)
Monocytes Absolute: 0.3 K/uL (ref 0.1–1.0)
Monocytes Relative: 5.1 % (ref 3.0–12.0)
Neutro Abs: 3.5 K/uL (ref 1.4–7.7)
Neutrophils Relative %: 65.3 % (ref 43.0–77.0)
Platelets: 199 K/uL (ref 150.0–400.0)
RBC: 4.71 Mil/uL (ref 3.87–5.11)
RDW: 13.7 % (ref 11.5–15.5)
WBC: 5.3 K/uL (ref 4.0–10.5)

## 2024-02-10 LAB — COMPREHENSIVE METABOLIC PANEL WITH GFR
ALT: 12 U/L (ref 3–35)
AST: 12 U/L (ref 5–37)
Albumin: 4.4 g/dL (ref 3.5–5.2)
Alkaline Phosphatase: 41 U/L (ref 39–117)
BUN: 24 mg/dL — ABNORMAL HIGH (ref 6–23)
CO2: 29 meq/L (ref 19–32)
Calcium: 10.3 mg/dL (ref 8.4–10.5)
Chloride: 99 meq/L (ref 96–112)
Creatinine, Ser: 1.26 mg/dL — ABNORMAL HIGH (ref 0.40–1.20)
GFR: 41.76 mL/min — ABNORMAL LOW
Glucose, Bld: 108 mg/dL — ABNORMAL HIGH (ref 70–99)
Potassium: 4.1 meq/L (ref 3.5–5.1)
Sodium: 137 meq/L (ref 135–145)
Total Bilirubin: 0.4 mg/dL (ref 0.2–1.2)
Total Protein: 7.1 g/dL (ref 6.0–8.3)

## 2024-02-10 LAB — LIPID PANEL
Cholesterol: 213 mg/dL — ABNORMAL HIGH (ref 28–200)
HDL: 34.2 mg/dL — ABNORMAL LOW
LDL Cholesterol: 119 mg/dL — ABNORMAL HIGH (ref 10–99)
NonHDL: 178.57
Total CHOL/HDL Ratio: 6
Triglycerides: 297 mg/dL — ABNORMAL HIGH (ref 10.0–149.0)
VLDL: 59.4 mg/dL — ABNORMAL HIGH (ref 0.0–40.0)

## 2024-02-10 LAB — HEMOGLOBIN A1C: Hgb A1c MFr Bld: 6.3 % (ref 4.6–6.5)

## 2024-02-10 LAB — TSH: TSH: 0.11 u[IU]/mL — ABNORMAL LOW (ref 0.35–5.50)

## 2024-02-10 MED ORDER — FLUTICASONE FUROATE-VILANTEROL 100-25 MCG/ACT IN AEPB: 1.0000 | INHALATION_SPRAY | Freq: Every day | RESPIRATORY_TRACT | 11 refills | Status: DC

## 2024-02-10 MED ORDER — LOSARTAN POTASSIUM-HCTZ 100-25 MG PO TABS: 1.0000 | ORAL_TABLET | Freq: Every day | ORAL | 1 refills | Status: AC

## 2024-02-10 NOTE — Assessment & Plan Note (Signed)
 Slightly elevated Inc losartan  100/25 daily

## 2024-02-10 NOTE — Assessment & Plan Note (Signed)
 Con't eliquis   Per cardiology

## 2024-02-10 NOTE — Progress Notes (Signed)
 "  Subjective:    Patient ID: Samantha Hanson, female    DOB: May 20, 1950, 74 y.o.   MRN: [id number]  Chief Complaint  Patient presents with   Hypertension   Follow-up    HPI Patient is in today for f/u bp and eye dr wants her labs to be rechecked.  Discussed the use of AI scribe software for clinical note transcription with the patient, who gave verbal consent to proceed.  History of Present Illness Samantha Hanson is a 74 year old female who presents for evaluation of diabetes and blood pressure management.  She has a history of central retinal vein occlusion, which she describes as a 'vein busted' in her eye. This event occurred overnight while her family was visiting, initially perceived as a floater or an eyelash. She sought care from an eye doctor and a retina specialist, who confirmed the diagnosis. She continues to receive monthly injections to manage swelling and blood accumulation in the eye.  She has a history of hypertension and is currently taking losartan , which includes a low dose of a diuretic. She feels anxious and jumpy, particularly after not sleeping well the night before the visit. She recalls being advised to double her blood pressure medication in the past.  She has a history of smoking since the age of 55 and currently smokes one pack per day. She experiences a chronic cough and wheezing, and coughs up clear sputum. No chest pain, but she has experienced shortness of breath in the past. She has not undergone regular CT scans for lung cancer screening but recalls having a CT scan in the past for back issues.  She has a history of atrial fibrillation and was previously prescribed Eliquis , which she discontinued due to disbelief in the diagnosis. She experiences episodes where her body 'just freezes' and uses Xanax  for anxiety.  She recently received a shingles vaccination and a flu shot earlier this week. She attempted to have lab work done at that time but was  informed she needed a doctor's order.    Past Medical History:  Diagnosis Date   Hyperlipidemia    Hypertension    Migraine    MVP (mitral valve prolapse)    Thyroid  disease     Past Surgical History:  Procedure Laterality Date   BUNIONECTOMY     Right foot    Family History  Problem Relation Age of Onset   Coronary artery disease Father    Arthritis Father    Heart disease Father        heart transplant candidate but too old--per pt   Thyroid  cancer Mother    Osteoporosis Sister     Social History   Socioeconomic History   Marital status: Married    Spouse name: Not on file   Number of children: Not on file   Years of education: Not on file   Highest education level: Not on file  Occupational History   Not on file  Tobacco Use   Smoking status: Every Day    Current packs/day: 1.50    Average packs/day: 1.5 packs/day for 50.0 years (75.0 ttl pk-yrs)    Types: Cigarettes   Smokeless tobacco: Never   Tobacco comments:    1 pack daily 01/07/21  Substance and Sexual Activity   Alcohol use: No   Drug use: No   Sexual activity: Not Currently  Other Topics Concern   Not on file  Social History Narrative   Not on file   Social Drivers  of Health   Tobacco Use: High Risk (02/10/2024)   Patient History    Smoking Tobacco Use: Every Day    Smokeless Tobacco Use: Never    Passive Exposure: Not on file  Financial Resource Strain: Low Risk (01/07/2022)   Overall Financial Resource Strain (CARDIA)    Difficulty of Paying Living Expenses: Not hard at all  Food Insecurity: No Food Insecurity (01/07/2022)   Hunger Vital Sign    Worried About Running Out of Food in the Last Year: Never true    Ran Out of Food in the Last Year: Never true  Transportation Needs: No Transportation Needs (01/07/2022)   PRAPARE - Administrator, Civil Service (Medical): No    Lack of Transportation (Non-Medical): No  Physical Activity: Inactive (01/07/2022)   Exercise Vital  Sign    Days of Exercise per Week: 0 days    Minutes of Exercise per Session: 0 min  Stress: No Stress Concern Present (01/07/2022)   [employer] of Occupational Health - Occupational Stress Questionnaire    Feeling of Stress : Only a little  Social Connections: Moderately Integrated (01/07/2022)   Social Connection and Isolation Panel    Frequency of Communication with Friends and Family: More than three times a week    Frequency of Social Gatherings with Friends and Family: Once a week    Attends Religious Services: 1 to 4 times per year    Active Member of [employer] or Organizations: No    Attends Banker Meetings: Never    Marital Status: Married  Catering Manager Violence: Not At Risk (01/07/2022)   Humiliation, Afraid, Rape, and Kick questionnaire    Fear of Current or Ex-Partner: No    Emotionally Abused: No    Physically Abused: No    Sexually Abused: No  Depression (PHQ2-9): Low Risk (08/16/2023)   Depression (PHQ2-9)    PHQ-2 Score: 0  Alcohol Screen: Low Risk (01/07/2022)   Alcohol Screen    Last Alcohol Screening Score (AUDIT): 0  Housing: Low Risk (01/07/2022)   Housing    Last Housing Risk Score: 0  Utilities: Not At Risk (01/07/2022)   AHC Utilities    Threatened with loss of utilities: No  Health Literacy: Not on file    Outpatient Medications Prior to Visit  Medication Sig Dispense Refill   apixaban  (ELIQUIS ) 5 MG TABS tablet Take 1 tablet (5 mg total) by mouth 2 (two) times daily. 28 tablet    calcium  carbonate 1250 MG capsule Take 1,250 mg by mouth daily.     Cholecalciferol (VITAMIN D ) 2000 UNITS CAPS Take by mouth.     Coenzyme Q10 (CO Q-10 PO) Take by mouth.     Cyanocobalamin  (B-12 PO) Take 1 tablet by mouth daily.     fish oil-omega-3 fatty acids 1000 MG capsule Take 1 g by mouth daily.      gabapentin  (NEURONTIN ) 100 MG capsule TAKE 1 CAPSULE (100 MG TOTAL) BY MOUTH 3 (THREE) TIMES DAILY. (NEED MD APPOINTMENT FOR REFILLS) 270 capsule 0    glucosamine-chondroitin 500-400 MG tablet Take 1 tablet by mouth 3 (three) times daily.     lansoprazole  (PREVACID ) 30 MG capsule Take 1 capsule (30 mg total) by mouth daily as needed. 90 capsule 0   levothyroxine  (SYNTHROID ) 125 MCG tablet Take 1 tablet (125 mcg total) by mouth daily before breakfast. 90 tablet 3   metoprolol  tartrate (LOPRESSOR ) 50 MG tablet Take 1 tablet (50 mg total) by mouth 2 (two)  times daily. 180 tablet 1   Multiple Vitamins-Minerals (MULTIVITAMIN PO) Take 1 tablet by mouth daily.     OVER THE COUNTER MEDICATION Primrose oil     Potassium 99 MG TABS Take 99 mg by mouth daily.     simvastatin  (ZOCOR ) 20 MG tablet Take 1 tablet (20 mg total) by mouth at bedtime. 90 tablet 1   venlafaxine  XR (EFFEXOR -XR) 75 MG 24 hr capsule Take 1 capsule (75 mg total) by mouth daily. 90 capsule 1   losartan -hydrochlorothiazide (HYZAAR) 100-12.5 MG tablet Take 1 tablet by mouth daily. 90 tablet 1   tolterodine  (DETROL  LA) 4 MG 24 hr capsule Take 1 capsule (4 mg total) by mouth daily. (Patient not taking: Reported on 02/10/2024) 90 capsule 1   No facility-administered medications prior to visit.    Allergies  Allergen Reactions   Fenofibrate  Diarrhea    Review of Systems  Constitutional:  Negative for fever and malaise/fatigue.  HENT:  Negative for congestion.   Eyes:  Negative for blurred vision.  Respiratory:  Negative for shortness of breath.   Cardiovascular:  Negative for chest pain, palpitations and leg swelling.  Gastrointestinal:  Negative for abdominal pain, blood in stool and nausea.  Genitourinary:  Negative for dysuria and frequency.  Musculoskeletal:  Negative for falls.  Skin:  Negative for rash.  Neurological:  Negative for dizziness, loss of consciousness and headaches.  Endo/Heme/Allergies:  Negative for environmental allergies.  Psychiatric/Behavioral:  Negative for depression. The patient is not nervous/anxious.        Objective:    Physical Exam Vitals  and nursing note reviewed.  Constitutional:      General: She is not in acute distress.    Appearance: Normal appearance. She is well-developed.  HENT:     Head: Normocephalic and atraumatic.  Eyes:     General: No scleral icterus.       Right eye: No discharge.        Left eye: No discharge.  Cardiovascular:     Rate and Rhythm: Normal rate and regular rhythm.     Heart sounds: No murmur heard. Pulmonary:     Effort: Pulmonary effort is normal. No respiratory distress.     Breath sounds: Normal breath sounds.  Musculoskeletal:        General: Normal range of motion.     Cervical back: Normal range of motion and neck supple.     Right lower leg: No edema.     Left lower leg: No edema.  Skin:    General: Skin is warm and dry.  Neurological:     Mental Status: She is alert and oriented to person, place, and time.  Psychiatric:        Mood and Affect: Mood normal.        Behavior: Behavior normal.        Thought Content: Thought content normal.        Judgment: Judgment normal.     BP (!) 140/80 (BP Location: Left Arm, Patient Position: Sitting, Cuff Size: Large)   Pulse 62   Temp 98 F (36.7 C) (Oral)   Resp 18   Ht 5' 7 (1.702 m)   Wt 184 lb 3.2 oz (83.6 kg)   SpO2 99%   BMI 28.85 kg/m  Wt Readings from Last 3 Encounters:  02/10/24 184 lb 3.2 oz (83.6 kg)  08/16/23 190 lb 3.2 oz (86.3 kg)  06/13/23 185 lb 3.2 oz (84 kg)    Diabetic Foot Exam -  Simple   No data filed    Lab Results  Component Value Date   WBC 6.9 08/16/2023   HGB 13.3 08/16/2023   HCT 40.5 08/16/2023   PLT 243.0 08/16/2023   GLUCOSE 110 (H) 08/16/2023   CHOL 280 (H) 08/16/2023   TRIG (H) 08/16/2023    478.0 Triglyceride is over 400; calculations on Lipids are invalid.   HDL 37.90 (L) 08/16/2023   LDLDIRECT 82.0 08/16/2023   LDLCALC 262 (H) 06/05/2013   ALT 23 08/16/2023   AST 22 08/16/2023   NA 135 08/16/2023   K 4.4 08/16/2023   CL 99 08/16/2023   CREATININE 1.56 (H) 08/16/2023    BUN 23 08/16/2023   CO2 27 08/16/2023   TSH 2.37 08/16/2023   HGBA1C 6.6 (H) 05/20/2020    Lab Results  Component Value Date   TSH 2.37 08/16/2023   Lab Results  Component Value Date   WBC 6.9 08/16/2023   HGB 13.3 08/16/2023   HCT 40.5 08/16/2023   MCV 89.2 08/16/2023   PLT 243.0 08/16/2023   Lab Results  Component Value Date   NA 135 08/16/2023   K 4.4 08/16/2023   CO2 27 08/16/2023   GLUCOSE 110 (H) 08/16/2023   BUN 23 08/16/2023   CREATININE 1.56 (H) 08/16/2023   BILITOT 0.4 08/16/2023   ALKPHOS 39 08/16/2023   AST 22 08/16/2023   ALT 23 08/16/2023   PROT 7.2 08/16/2023   ALBUMIN 4.3 08/16/2023   CALCIUM  9.8 08/16/2023   ANIONGAP 13 12/30/2020   GFR 32.43 (L) 08/16/2023   Lab Results  Component Value Date   CHOL 280 (H) 08/16/2023   Lab Results  Component Value Date   HDL 37.90 (L) 08/16/2023   Lab Results  Component Value Date   LDLCALC 262 (H) 06/05/2013   Lab Results  Component Value Date   TRIG (H) 08/16/2023    478.0 Triglyceride is over 400; calculations on Lipids are invalid.   Lab Results  Component Value Date   CHOLHDL 7 08/16/2023   Lab Results  Component Value Date   HGBA1C 6.6 (H) 05/20/2020       Assessment & Plan:  Essential hypertension Assessment & Plan: Slightly elevated Inc losartan  100/25 daily   Orders: -     Hemoglobin A1c -     Losartan  Potassium-HCTZ; Take 1 tablet by mouth daily.  Dispense: 90 tablet; Refill: 1  Hypothyroidism, unspecified type -     TSH -     Hemoglobin A1c  Hyperlipidemia, unspecified hyperlipidemia type -     CBC with Differential/Platelet -     Comprehensive metabolic panel with GFR -     Lipid panel  Aortic atherosclerosis  Paroxysmal atrial fibrillation (HCC) Assessment & Plan: Con't eliquis   Per cardiology   Smoking greater than 40 pack years -     Ambulatory Referral for Lung Cancer Scre -     Fluticasone  Furoate-Vilanterol; Inhale 1 puff into the lungs daily.  Dispense:  1 each; Refill: 11 -     DG Chest 2 View; Future  COPD exacerbation (HCC) Assessment & Plan: BREO DAILY PT will let us  know if its too expensive    Assessment and Plan Assessment & Plan Central retinal vein occlusion   Acute onset of central retinal vein occlusion has caused significant vision impairment with no expected improvement. Monthly injections continue to manage swelling and blood accumulation.  Essential hypertension   Blood pressure is borderline elevated. She is currently on losartan  with  a low-dose diuretic. Previous advice to double metoprolol  was not followed. Increased losartan  dosage to further reduce blood pressure.  Paroxysmal atrial fibrillation   Previously managed with Eliquis , which she discontinued due to skepticism about the diagnosis, leading to symptom recurrence. Emphasized the importance of medication adherence to prevent complications. Continue Eliquis  for atrial fibrillation management.  Chronic cough and wheezing in a smoker   Chronic cough and wheezing persist with a smoking history of over 60 years. No prior lung cancer screening. Discussed the benefits of low-dose CT scan for lung cancer screening, which is more effective than chest x-ray in smokers. Ordered chest x-ray and referred for lung cancer screening with low-dose CT scan. If abnormalities are found, robotic surgery may be curative.  General health maintenance   She recently received shingles and flu vaccinations. Discussed the importance of regular screenings and vaccinations. Continue routine health maintenance and screenings.  y6  Jamee JONELLE Antonio Cyndee, DO  "

## 2024-02-10 NOTE — Assessment & Plan Note (Signed)
 BREO DAILY PT will let us  know if its too expensive

## 2024-02-13 ENCOUNTER — Ambulatory Visit: Payer: Self-pay | Admitting: Family Medicine

## 2024-02-15 ENCOUNTER — Telehealth: Payer: Self-pay

## 2024-02-15 NOTE — Telephone Encounter (Signed)
 Copied from CRM #[id number]. Topic: Clinical - Prescription Issue >> Feb 15, 2024 10:23 AM Rea ORN wrote: Reason for CRM: Pt stated that BREO ELLIPTA  is not covered by her insurance. She is asking for something else to be prescribed. I mentioned to pt that she may have to check with her insurance to see what is covered under their formulary. Pt would like rx to go to [id number] - [place], [place] - [address] AT [facility]  Please call back to advise,  [phone]

## 2024-02-16 ENCOUNTER — Other Ambulatory Visit: Payer: Self-pay | Admitting: Family Medicine

## 2024-02-16 DIAGNOSIS — J441 Chronic obstructive pulmonary disease with (acute) exacerbation: Secondary | ICD-10-CM

## 2024-02-16 MED ORDER — BUDESONIDE-FORMOTEROL FUMARATE 80-4.5 MCG/ACT IN AERO: 2.0000 | INHALATION_SPRAY | Freq: Two times a day (BID) | RESPIRATORY_TRACT | 3 refills | Status: DC

## 2024-02-17 MED ORDER — BUDESONIDE-FORMOTEROL FUMARATE 80-4.5 MCG/ACT IN AERO: 2.0000 | INHALATION_SPRAY | Freq: Two times a day (BID) | RESPIRATORY_TRACT | 0 refills | Status: AC

## 2024-02-17 NOTE — Telephone Encounter (Signed)
 LMOM informing that Symbicort  has been sent in. Informed if still not covered she will need to call ins to see what is on formulary.

## 2024-02-17 NOTE — Progress Notes (Signed)
 Pt stated that it will be cheaper to get through mailorder.  Rx cancelled at [facility] and sent to [facility].

## 2024-02-17 NOTE — Addendum Note (Signed)
 Addended by: Sahvanna Mcmanigal D on: 02/17/2024 11:46 AM   Modules accepted: Orders

## 2024-02-17 NOTE — Addendum Note (Signed)
 Addended by: ESTELLE GILLIS D on: 02/17/2024 04:09 PM   Modules accepted: Orders

## 2024-02-27 ENCOUNTER — Telehealth: Payer: Self-pay

## 2024-02-27 NOTE — Telephone Encounter (Unsigned)
 Copied from CRM [id number]. Topic: Clinical - Lab/Test Results >> Feb 27, 2024 10:17 AM Charolett L wrote: Reason for CRM: Patient is calling in to request a call back to go over lab results because she needs to give them to her retina specialist tomorrow  [phone]

## 2024-03-07 ENCOUNTER — Other Ambulatory Visit: Payer: Self-pay | Admitting: Family Medicine

## 2024-03-07 DIAGNOSIS — I10 Essential (primary) hypertension: Secondary | ICD-10-CM

## 2024-03-08 NOTE — Progress Notes (Unsigned)
 "  Cardiology Office Note:   Date:  03/08/2024  ID:  Samantha Hanson, DOB 06-08-1950, MRN [id number] PCP:  Antonio Meth, Jamee SAUNDERS, DO  [facility] Providers Cardiologist:  Wendel Haws, MD Referring MD: Nellene Quita SAUNDERS, PA  Chief Complaint/Reason for Referral: Paroxysmal atrial fibrillation ASSESSMENT:    1. Paroxysmal atrial fibrillation (HCC)   2. Hypercoagulable state due to paroxysmal atrial fibrillation (HCC)   3. Essential hypertension   4. Hyperlipidemia LDL goal <70   5. Coronary artery calcification seen on CAT scan   6. Aortic atherosclerosis   7. Stage 3b chronic kidney disease (HCC)   8. Prediabetes   9. BMI 28.0-28.9,adult     PLAN:   In order of problems listed above: Paroxysmal atrial fibrillation: Continue Eliquis  5 mg twice daily, Lopressor  50 mg twice daily.  Will obtain echocardiogram to evaluate for atrial size, diastolic dysfunction, LV function as her last echocardiogram was several years ago.*** Hypercoagulable state: Continue Eliquis  5 mg twice daily Hypertension: Continue losartan /hydrochlorothiazide 100/25 mg daily, metoprolol  50 mg twice daily*** Hyperlipidemia: LDL recently was 119.  Given evidence of coronary artery calcification and aortic atherosclerosis her LDL goal is at least less than 70 and may be lower depending on the LP(a) level.  Increase simvastatin  to 40 mg and check lipid panel, LFTs, LP(a) in 2 months.*** Aortic atherosclerosis: Continue Eliquis  5 mg twice daily and simvastatin  with dose adjustment as above CKD stage IIIb: Continue losartan /hydrochlorothiazide 100/25 mg daily and start Jardiance 10 mg daily for renal protection*** Prediabetes: Continue to aggressively control cardiovascular risk factors.  Start Jardiance 10 mg daily*** Elevated BMI: Continue diet and exercise modification.  If patient becomes diabetic would refer for GLP-1 therapy.       {Are you ordering a CV Procedure (e.g. stress test, cath, DCCV, TEE, etc)?   Press F2         :[id number]}   Dispo:  No follow-ups on file.       I spent *** minutes reviewing all clinical data during and prior to this visit including all relevant imaging studies, laboratories, clinical information from other health systems and prior notes from both Cardiology and other specialties, interviewing the patient, conducting a complete physical examination, and coordinating care in order to formulate a comprehensive and personalized evaluation and treatment plan.   History of Present Illness:    FOCUSED PROBLEM LIST:   PAF Normal biatrial size, no significant valve issues, EF 55 to 60% TTE 2023 Hypertension Hyperlipidemia Coronary artery calcification Chest CT 2022 Aortic atherosclerosis Chest CT 2022 CKD stage IIIb Prediabetes Hemoglobin A1c 6.3 2026 BMI 07 April 2024:  Patient consents to use of AI scribe. The patient is a 74 year old female with the above listed medical problems here to establish general cardiovascular care.  The patient has been followed by the atrial fibrillation clinic for some time.  She was initially diagnosed with atrial fibrillation in 2022 around presentation regarding foot fracture.  She ultimately converted back to normal sinus rhythm spontaneously.  She was started on anticoagulation due to an elevated CV 2 score.  She was last seen in atrial fibrillation clinic in May of last year.  She was doing well and no changes were made to her medical regimen.     Current Medications: Active Medications[1]   Review of Systems:   Please see the history of present illness.    All other systems reviewed and are negative.     EKGs/Labs/Other Test Reviewed:   EKG: May 2025  normal sinus rhythm  EKG Interpretation Date/Time:    Ventricular Rate:    PR Interval:    QRS Duration:    QT Interval:    QTC Calculation:   R Axis:      Text Interpretation:          CARDIAC STUDIES: Refer to CV Procedures and Imaging Tabs   Risk  Assessment/Calculations:    CHA2DS2-VASc Score = 4  {Confirm score is correct.  If not, click here to update score.  REFRESH note.  :1} This indicates a 4.8% annual risk of stroke. The patient's score is based upon: CHF History: 0 HTN History: 1 Diabetes History: 0 Stroke History: 0 Vascular Disease History: 1 Age Score: 1 Gender Score: 1   {This patient has a significant risk of stroke if diagnosed with atrial fibrillation.  Please consider VKA or DOAC agent for anticoagulation if the bleeding risk is acceptable.   You can also use the SmartPhrase .[email or web address] for documentation.   :[id number]}      Physical Exam:   VS:  There were no vitals taken for this visit.   No BP recorded.  {Refresh Note OR Click here to enter BP  :1}***   Wt Readings from Last 3 Encounters:  02/10/24 184 lb 3.2 oz (83.6 kg)  08/16/23 190 lb 3.2 oz (86.3 kg)  06/13/23 185 lb 3.2 oz (84 kg)      GENERAL:  No apparent distress, AOx3 HEENT:  No carotid bruits, +2 carotid impulses, no scleral icterus CAR: RRR Irregular RR*** no murmurs***, gallops, rubs, or thrills RES:  Clear to auscultation bilaterally ABD:  Soft, nontender, nondistended, positive bowel sounds x 4 VASC:  +2 radial pulses, +2 carotid pulses NEURO:  CN 2-12 grossly intact; motor and sensory grossly intact PSYCH:  No active depression or anxiety EXT:  No edema, ecchymosis, or cyanosis  Signed, Jaimere Feutz K Rosaland Shiffman, MD  03/08/2024 7:37 AM    [facility] [address] [place], [place], [place]  [address] Phone: [phone]; Fax: [phone]   Note:  This document was prepared using Dragon voice recognition software and may include unintentional dictation errors.    [1]  No outpatient medications have been marked as taking for the 03/12/24 encounter (Appointment) with Tyller Bowlby K, MD.   "

## 2024-03-12 ENCOUNTER — Ambulatory Visit: Admitting: Internal Medicine

## 2024-03-16 ENCOUNTER — Telehealth: Payer: Self-pay

## 2024-03-16 NOTE — Telephone Encounter (Signed)
 Copied from CRM #[id number]. Topic: Clinical - Medication Refill >> Mar 16, 2024  2:32 PM Wess S wrote: Medication: gabapentin  (NEURONTIN ) 100 MG capsule   Has the patient contacted their pharmacy? No (Agent: If no, request that the patient contact the pharmacy for the refill. If patient does not wish to contact the pharmacy document the reason why and proceed with request.) (Agent: If yes, when and what did the pharmacy advise?)  This is the patient's preferred pharmacy:   [facility] - [place], [place] - [address] [place] [address] Phone: [phone] Fax: [phone]  Is this the correct pharmacy for this prescription? Yes If no, delete pharmacy and type the correct one.   Has the prescription been filled recently? No  Is the patient out of the medication? No  Has the patient been seen for an appointment in the last year OR does the patient have an upcoming appointment? Yes  Can we respond through MyChart? Yes  Agent: Please be advised that Rx refills may take up to 3 business days. We ask that you follow-up with your pharmacy.

## 2024-03-16 NOTE — Telephone Encounter (Signed)
 Medication hasn't been refilled since 2024. Please advise
